# Patient Record
Sex: Female | Born: 1980 | Race: White | Hispanic: No | Marital: Married | Smoking: Never smoker
Health system: Southern US, Community
[De-identification: ages and names within clinical notes are randomized; demographics above are authoritative.]

## PROBLEM LIST (undated history)

## (undated) DIAGNOSIS — F419 Anxiety disorder, unspecified: Secondary | ICD-10-CM

## (undated) DIAGNOSIS — F32A Depression, unspecified: Secondary | ICD-10-CM

## (undated) DIAGNOSIS — G4700 Insomnia, unspecified: Secondary | ICD-10-CM

## (undated) DIAGNOSIS — R000 Tachycardia, unspecified: Secondary | ICD-10-CM

## (undated) DIAGNOSIS — F329 Major depressive disorder, single episode, unspecified: Secondary | ICD-10-CM

## (undated) HISTORY — DX: Depression, unspecified: F32.A

## (undated) HISTORY — DX: Anxiety disorder, unspecified: F41.9

## (undated) HISTORY — PX: APPENDECTOMY: SHX54

## (undated) HISTORY — DX: Tachycardia, unspecified: R00.0

## (undated) HISTORY — DX: Major depressive disorder, single episode, unspecified: F32.9

## (undated) HISTORY — DX: Insomnia, unspecified: G47.00

---

## 2007-08-04 ENCOUNTER — Ambulatory Visit: Payer: Self-pay | Admitting: Family Medicine

## 2007-08-09 ENCOUNTER — Ambulatory Visit: Payer: Self-pay | Admitting: Obstetrics and Gynecology

## 2007-12-30 ENCOUNTER — Emergency Department: Payer: Self-pay | Admitting: Internal Medicine

## 2008-10-06 ENCOUNTER — Ambulatory Visit: Payer: Self-pay | Admitting: Family Medicine

## 2008-10-07 ENCOUNTER — Ambulatory Visit: Payer: Self-pay | Admitting: General Surgery

## 2012-09-05 ENCOUNTER — Encounter: Payer: Self-pay | Admitting: Family Medicine

## 2012-09-05 ENCOUNTER — Encounter: Payer: Self-pay | Admitting: *Deleted

## 2012-09-05 ENCOUNTER — Ambulatory Visit (INDEPENDENT_AMBULATORY_CARE_PROVIDER_SITE_OTHER): Payer: BC Managed Care – PPO | Admitting: Family Medicine

## 2012-09-05 VITALS — BP 110/80 | HR 60 | Temp 97.8°F | Ht 66.0 in | Wt 173.0 lb

## 2012-09-05 DIAGNOSIS — F32A Depression, unspecified: Secondary | ICD-10-CM

## 2012-09-05 DIAGNOSIS — F3341 Major depressive disorder, recurrent, in partial remission: Secondary | ICD-10-CM | POA: Insufficient documentation

## 2012-09-05 DIAGNOSIS — G4700 Insomnia, unspecified: Secondary | ICD-10-CM | POA: Insufficient documentation

## 2012-09-05 DIAGNOSIS — F329 Major depressive disorder, single episode, unspecified: Secondary | ICD-10-CM

## 2012-09-05 DIAGNOSIS — R5383 Other fatigue: Secondary | ICD-10-CM | POA: Insufficient documentation

## 2012-09-05 DIAGNOSIS — Z8349 Family history of other endocrine, nutritional and metabolic diseases: Secondary | ICD-10-CM

## 2012-09-05 DIAGNOSIS — Z8342 Family history of familial hypercholesterolemia: Secondary | ICD-10-CM

## 2012-09-05 DIAGNOSIS — R5381 Other malaise: Secondary | ICD-10-CM | POA: Insufficient documentation

## 2012-09-05 DIAGNOSIS — F3289 Other specified depressive episodes: Secondary | ICD-10-CM

## 2012-09-05 LAB — CBC WITH DIFFERENTIAL/PLATELET
Basophils Absolute: 0.1 10*3/uL (ref 0.0–0.1)
Eosinophils Relative: 1.4 % (ref 0.0–5.0)
Lymphs Abs: 2.3 10*3/uL (ref 0.7–4.0)
MCV: 86 fl (ref 78.0–100.0)
Monocytes Absolute: 0.5 10*3/uL (ref 0.1–1.0)
Neutrophils Relative %: 61.7 % (ref 43.0–77.0)
Platelets: 250 10*3/uL (ref 150.0–400.0)
RDW: 12.6 % (ref 11.5–14.6)
WBC: 7.8 10*3/uL (ref 4.5–10.5)

## 2012-09-05 LAB — LIPID PANEL
Cholesterol: 227 mg/dL — ABNORMAL HIGH (ref 0–200)
HDL: 68.1 mg/dL (ref >39.00)
Total CHOL/HDL Ratio: 3
Triglycerides: 89 mg/dL (ref 0.0–149.0)

## 2012-09-05 LAB — COMPREHENSIVE METABOLIC PANEL
AST: 13 U/L (ref 0–37)
Alkaline Phosphatase: 67 U/L (ref 39–117)
BUN: 9 mg/dL (ref 6–23)
Calcium: 8.9 mg/dL (ref 8.4–10.5)
Creatinine, Ser: 1 mg/dL (ref 0.4–1.2)
Glucose, Bld: 68 mg/dL — ABNORMAL LOW (ref 70–99)

## 2012-09-05 LAB — TSH: TSH: 1 u[IU]/mL (ref 0.35–5.50)

## 2012-09-05 LAB — LDL CHOLESTEROL, DIRECT: Direct LDL: 141.4 mg/dL

## 2012-09-05 NOTE — Progress Notes (Signed)
  Subjective:    Patient ID: Angela Knox, female    DOB: [birth year], 32 y.o.   MRN: [id number]  HPI  Very pleasant 32 yo G0 here to establish care.  In April 28, 2023, her brother died at 15 yo.  He had HTN and CKD.  Was told he died of "heart dissection." Since then, she decided she needed to come get some screening blood work. Does have pos FH of HLD.  Sees OBGYN, Madelin Headings, regularly. Last pap smear was in August 2013.  Depression- on wellbutrin.  Feels current does is working well and takes occasional Ambien.  Denies any current symptoms of anxiety or depression.  Patient Active Problem List   Diagnosis Date Noted  . Other malaise and fatigue 09/05/2012  . Family history of high cholesterol 09/05/2012  . Depression   . Insomnia    Past Medical History  Diagnosis Date  . Depression   . Insomnia    Past Surgical History  Procedure Laterality Date  . Appendectomy     History  Substance Use Topics  . Smoking status: Never Smoker   . Smokeless tobacco: Not on file  . Alcohol Use: Not on file   Family History  Problem Relation Age of Onset  . Hyperlipidemia Father   . Heart disease Brother   . Kidney disease Brother   . Hypertension Brother    Allergies  Allergen Reactions  . Asa (Aspirin) Itching and Rash   No current outpatient prescriptions on file prior to visit.   No current facility-administered medications on file prior to visit.   The PMH, PSH, Social History, Family History, Medications, and allergies have been reviewed in [facility], and have been updated if relevant.    Review of Systems    See HPI + fatigue No CP or DOE  Objective:   Physical Exam BP 110/80  Pulse 60  Temp(Src) 97.8 F (36.6 C)  Ht 5\' 6"  (1.676 m)  Wt 173 lb (78.472 kg)  BMI 27.94 kg/m2  General:  Well-developed,well-nourished,in no acute distress; alert,appropriate and cooperative throughout examination Head:  normocephalic and atraumatic.   Eyes:  vision grossly intact,  pupils equal, pupils round, and pupils reactive to light.   Ears:  R ear normal and L ear normal.   Nose:  no external deformity.   Mouth:  good dentition.   Lungs:  Normal respiratory effort, chest expands symmetrically. Lungs are clear to auscultation, no crackles or wheezes. Heart:  Normal rate and regular rhythm. S1 and S2 normal without gallop, murmur, click, rub or other extra sounds. Abdomen:  Bowel sounds positive,abdomen soft and non-tender without masses, organomegaly or hernias noted. Msk:  No deformity or scoliosis noted of thoracic or lumbar spine.   Extremities:  No clubbing, cyanosis, edema, or deformity noted with normal full range of motion of all joints.   Neurologic:  alert & oriented X3 and gait normal.   Skin:  Intact without suspicious lesions or rashes Psych:  Cognition and judgment appear intact. Alert and cooperative with normal attention span and concentration. No apparent delusions, illusions, hallucinations     Assessment & Plan:  1. Depression Stable on current rx.  2. Other malaise and fatigue Intermittent.  Likely multifactorial.  Will check labs today. - CBC with Differential - TSH  3. Family history of high cholesterol  - Comprehensive metabolic panel - Lipid Panel

## 2012-09-05 NOTE — Patient Instructions (Addendum)
Nice to meet you. We will call you with your lab results.   

## 2012-09-11 ENCOUNTER — Encounter: Payer: Self-pay | Admitting: Family Medicine

## 2012-09-11 NOTE — Telephone Encounter (Signed)
Opened in error

## 2012-09-11 NOTE — Telephone Encounter (Signed)
Advised patient of lab results, diet information mailed to her.

## 2013-02-27 ENCOUNTER — Other Ambulatory Visit: Payer: Self-pay

## 2014-01-14 LAB — HM PAP SMEAR: HM Pap smear: NEGATIVE

## 2014-09-23 ENCOUNTER — Ambulatory Visit (INDEPENDENT_AMBULATORY_CARE_PROVIDER_SITE_OTHER): Payer: BLUE CROSS/BLUE SHIELD | Admitting: Obstetrics and Gynecology

## 2014-09-23 ENCOUNTER — Encounter: Payer: Self-pay | Admitting: Obstetrics and Gynecology

## 2014-09-23 VITALS — BP 126/82 | HR 93 | Ht 65.0 in | Wt 187.6 lb

## 2014-09-23 DIAGNOSIS — R5383 Other fatigue: Secondary | ICD-10-CM | POA: Diagnosis not present

## 2014-09-23 DIAGNOSIS — R635 Abnormal weight gain: Secondary | ICD-10-CM | POA: Diagnosis not present

## 2014-09-23 MED ORDER — CYANOCOBALAMIN 1000 MCG/ML IJ SOLN
1000.0000 ug | Freq: Once | INTRAMUSCULAR | Status: AC
  Administered 2014-09-23: 1000 ug via INTRAMUSCULAR

## 2014-09-23 MED ORDER — CYANOCOBALAMIN 1000 MCG/ML IJ SOLN: 1000.0000 ug | Freq: Once | INTRAMUSCULAR | Status: DC

## 2014-09-23 MED ORDER — PHENTERMINE HCL 37.5 MG PO TABS: 37.5000 mg | ORAL_TABLET | Freq: Every day | ORAL | Status: DC

## 2014-09-23 NOTE — Progress Notes (Signed)
Subjective:     Angela PallJennifer Knox is a 34 y.o. female here for discussion regarding weight loss. She has noted a weight gain of approximately 8 pounds over the last 5 months. She feels ideal weight is 160 pounds. Weight at graduation from high school was ? pounds. History of eating disorders: none. There is a family history positive for obesity in the patient, mother and father. Previous treatments for obesity include commercial weight loss program: adipex, prescription appetite suppressants: adipex, self-directed dieting and Weight Watchers. Obesity associated medical conditions: depression. Obesity associated medications: wellbutrin. Cardiovascular risk factors besides obesity: none.  The following portions of the patient's history were reviewed and updated as appropriate: allergies, current medications, past family history, past medical history, past social history, past surgical history and problem list.  Review of Systems A comprehensive review of systems was negative except for: Gastrointestinal: positive for change in bowel habits Behavioral/Psych: positive for depression Allergic/Immunologic: positive for hay fever    Objective:    Body mass index is 31.22 kg/(m^2). BP 126/82 mmHg  Pulse 93  Ht 5\' 5"  (1.651 m)  Wt 187 lb 9.6 oz (85.095 kg)  BMI 31.22 kg/m2  LMP 09/16/2014 (Exact Date)  General Appearance:    Alert, cooperative, no distress, appears stated age  Head:    Normocephalic, without obvious abnormality, atraumatic  Eyes:    PERRL, conjunctiva/corneas clear, EOM's intact, fundi    benign, both eyes  Ears:    Normal TM's and external ear canals, both ears  Nose:   Nares normal, septum midline, mucosa normal, no drainage    or sinus tenderness  Throat:   Lips, mucosa, and tongue normal; teeth and gums normal  Neck:   Supple, symmetrical, trachea midline, no adenopathy;    thyroid:  no enlargement/tenderness/nodules; no carotid   bruit or JVD  Back:     Symmetric, no  curvature, ROM normal, no CVA tenderness  Lungs:     Clear to auscultation bilaterally, respirations unlabored  Chest Wall:    No tenderness or deformity   Heart:    Regular rate and rhythm, S1 and S2 normal, no murmur, rub   or gallop  Breast Exam:  Not indicated  Abdomen:     Soft, non-tender, bowel sounds active all four quadrants,    no masses, no organomegaly  Genitalia:  Not indicated  Rectal:  Not indicated  Extremities:   Extremities normal, atraumatic, no cyanosis or edema  Pulses:   2+ and symmetric all extremities  Skin:   Skin color, texture, turgor normal, no rashes or lesions  Lymph nodes:   Cervical, supraclavicular, and axillary nodes normal  Neurologic:   CNII-XII intact, normal strength, sensation and reflexes    throughout      Assessment:    Obesity. I assessed Angela Knox to be in a relapse stage with respect to weight loss.    Plan:     Labs obtained today: Vitamin B & D, metabolic panel, CBC, iron, Thyriod panel  Daimen Shovlin Elissa LovettN Burr, CNM [facility]

## 2014-09-24 ENCOUNTER — Encounter: Payer: Self-pay | Admitting: Obstetrics and Gynecology

## 2014-09-24 ENCOUNTER — Other Ambulatory Visit: Payer: Self-pay | Admitting: Obstetrics and Gynecology

## 2014-09-24 LAB — COMPREHENSIVE METABOLIC PANEL
ALK PHOS: 90 IU/L (ref 39–117)
ALT: 13 IU/L (ref 0–32)
AST: 12 IU/L (ref 0–40)
Albumin/Globulin Ratio: 1.7 (ref 1.1–2.5)
Albumin: 4.2 g/dL (ref 3.5–5.5)
BUN / CREAT RATIO: 9 (ref 8–20)
BUN: 8 mg/dL (ref 6–20)
CO2: 25 mmol/L (ref 18–29)
Calcium: 9.2 mg/dL (ref 8.7–10.2)
Chloride: 99 mmol/L (ref 97–108)
Creatinine, Ser: 0.87 mg/dL (ref 0.57–1.00)
GFR calc Af Amer: 103 mL/min/{1.73_m2} (ref >59)
GFR calc non Af Amer: 89 mL/min/{1.73_m2} (ref >59)
GLOBULIN, TOTAL: 2.5 g/dL (ref 1.5–4.5)
Glucose: 111 mg/dL — ABNORMAL HIGH (ref 65–99)
Potassium: 3.8 mmol/L (ref 3.5–5.2)
SODIUM: 140 mmol/L (ref 134–144)
Total Protein: 6.7 g/dL (ref 6.0–8.5)

## 2014-09-24 LAB — VITAMIN D 25 HYDROXY (VIT D DEFICIENCY, FRACTURES): Vit D, 25-Hydroxy: 25.7 ng/mL — ABNORMAL LOW (ref 30.0–100.0)

## 2014-09-24 LAB — CBC WITH DIFFERENTIAL/PLATELET
Basophils Absolute: 0 10*3/uL (ref 0.0–0.2)
Basos: 0 %
EOS (ABSOLUTE): 0.1 10*3/uL (ref 0.0–0.4)
EOS: 1 %
HEMATOCRIT: 38 % (ref 34.0–46.6)
HEMOGLOBIN: 12.5 g/dL (ref 11.1–15.9)
Immature Grans (Abs): 0 10*3/uL (ref 0.0–0.1)
Immature Granulocytes: 0 %
Lymphocytes Absolute: 2.3 10*3/uL (ref 0.7–3.1)
Lymphs: 28 %
MCH: 28 pg (ref 26.6–33.0)
MCHC: 32.9 g/dL (ref 31.5–35.7)
MCV: 85 fL (ref 79–97)
Monocytes Absolute: 0.6 10*3/uL (ref 0.1–0.9)
Monocytes: 7 %
NEUTROS PCT: 64 %
Neutrophils Absolute: 5.1 10*3/uL (ref 1.4–7.0)
PLATELETS: 311 10*3/uL (ref 150–379)
RBC: 4.47 x10E6/uL (ref 3.77–5.28)
RDW: 13.2 % (ref 12.3–15.4)
WBC: 8 10*3/uL (ref 3.4–10.8)

## 2014-09-24 LAB — VITAMIN B12: Vitamin B-12: 268 pg/mL (ref 211–946)

## 2014-09-24 LAB — FERRITIN: Ferritin: 47 ng/mL (ref 15–150)

## 2014-09-24 MED ORDER — VITAMIN D (ERGOCALCIFEROL) 1.25 MG (50000 UNIT) PO CAPS: 50000.0000 [IU] | ORAL_CAPSULE | ORAL | Status: DC

## 2014-09-25 ENCOUNTER — Telehealth: Payer: Self-pay | Admitting: *Deleted

## 2014-09-25 LAB — THYROID PANEL WITH TSH
FREE THYROXINE INDEX: 2.4 (ref 1.2–4.9)
T3 UPTAKE RATIO: 24 % (ref 24–39)
T4, Total: 10.2 ug/dL (ref 4.5–12.0)
TSH: 1.68 u[IU]/mL (ref 0.450–4.500)

## 2014-09-25 LAB — SPECIMEN STATUS REPORT

## 2014-09-25 MED ORDER — CYANOCOBALAMIN 1000 MCG/ML IJ SOLN: 1000.0000 ug | INTRAMUSCULAR | Status: DC

## 2014-09-25 NOTE — Telephone Encounter (Signed)
Faxed request from pharmacy to clarify directions New rx sent with correct instructions

## 2014-09-29 NOTE — Progress Notes (Signed)
Quick Note:  Please let her know her entire thyriod panel was normal ______

## 2014-10-21 ENCOUNTER — Ambulatory Visit (INDEPENDENT_AMBULATORY_CARE_PROVIDER_SITE_OTHER): Payer: BLUE CROSS/BLUE SHIELD | Admitting: *Deleted

## 2014-10-21 VITALS — BP 119/83 | HR 87 | Ht 65.0 in | Wt 180.6 lb

## 2014-10-21 DIAGNOSIS — E669 Obesity, unspecified: Secondary | ICD-10-CM | POA: Diagnosis not present

## 2014-10-21 MED ORDER — CYANOCOBALAMIN 1000 MCG/ML IJ SOLN
1000.0000 ug | Freq: Once | INTRAMUSCULAR | Status: AC
  Administered 2014-10-21: 1000 ug via INTRAMUSCULAR

## 2014-10-21 NOTE — Progress Notes (Cosign Needed)
Pt is here for wt, bp check, b-12 inj Pt denies any s/e, is happy with her weight loss

## 2014-11-25 ENCOUNTER — Ambulatory Visit (INDEPENDENT_AMBULATORY_CARE_PROVIDER_SITE_OTHER): Payer: BLUE CROSS/BLUE SHIELD | Admitting: Obstetrics and Gynecology

## 2014-11-25 VITALS — BP 113/88 | HR 101 | Ht 65.0 in | Wt 175.8 lb

## 2014-11-25 DIAGNOSIS — E669 Obesity, unspecified: Secondary | ICD-10-CM

## 2014-11-25 MED ORDER — CYANOCOBALAMIN 1000 MCG/ML IJ SOLN
1000.0000 ug | Freq: Once | INTRAMUSCULAR | Status: AC
  Administered 2014-11-25: 1000 ug via INTRAMUSCULAR

## 2014-11-25 NOTE — Progress Notes (Cosign Needed)
Pt is here for wt, bp check, b-12 inj She is doing well on medication, denies any s/e

## 2014-12-25 ENCOUNTER — Ambulatory Visit (INDEPENDENT_AMBULATORY_CARE_PROVIDER_SITE_OTHER): Payer: BLUE CROSS/BLUE SHIELD | Admitting: Obstetrics and Gynecology

## 2014-12-25 VITALS — BP 116/85 | HR 105 | Ht 65.0 in | Wt 174.2 lb

## 2014-12-25 DIAGNOSIS — E669 Obesity, unspecified: Secondary | ICD-10-CM

## 2014-12-25 NOTE — Progress Notes (Signed)
Patient ID: Angela Knox, female   DOB: [birth year], 34 y.o.   MRN: [id number] Pt presents for wt., B/P, and B-12 injection.  Wt. Loss of nearly 1lb since last month. No s/s side effects of medication. Pt states it has been too hot to exercise outside so exercising has decreased. Pt desires to get B-12 injection at annual visit because it made her sick the last time she got it before she eat.

## 2015-01-19 ENCOUNTER — Encounter: Payer: Self-pay | Admitting: Obstetrics and Gynecology

## 2015-01-19 ENCOUNTER — Other Ambulatory Visit: Payer: Self-pay | Admitting: Obstetrics and Gynecology

## 2015-01-19 ENCOUNTER — Ambulatory Visit (INDEPENDENT_AMBULATORY_CARE_PROVIDER_SITE_OTHER): Payer: BLUE CROSS/BLUE SHIELD | Admitting: Obstetrics and Gynecology

## 2015-01-19 VITALS — BP 120/80 | HR 99 | Ht 65.0 in | Wt 174.5 lb

## 2015-01-19 DIAGNOSIS — Z01419 Encounter for gynecological examination (general) (routine) without abnormal findings: Secondary | ICD-10-CM | POA: Diagnosis not present

## 2015-01-19 MED ORDER — ZOLPIDEM TARTRATE 10 MG PO TABS: 10.0000 mg | ORAL_TABLET | Freq: Every evening | ORAL | Status: DC | PRN

## 2015-01-19 NOTE — Progress Notes (Signed)
Subjective:   Angela Knox is a 34 y.o. No obstetric history on file. Caucasian female here for a routine well-woman exam.  Patient's last menstrual period was 01/06/2015 (exact date).    Current complaints: none PCP: me       Does need labs  Social History: Sexual: heterosexual Marital Status: married Living situation: with spouse Occupation: self-employed Tobacco/alcohol: no tobacco use Illicit drugs: no history of illicit drug use  The following portions of the patient's history were reviewed and updated as appropriate: allergies, current medications, past family history, past medical history, past social history, past surgical history and problem list.  Past Medical History Past Medical History  Diagnosis Date  . Depression   . Insomnia     Past Surgical History Past Surgical History  Procedure Laterality Date  . Appendectomy      Gynecologic History No obstetric history on file.  Patient's last menstrual period was 01/06/2015 (exact date). Contraception: OCP (estrogen/progesterone) Last Pap: 2014. Results were: normal  Obstetric History OB History  No data available    Current Medications Current Outpatient Prescriptions on File Prior to Visit  Medication Sig Dispense Refill  . buPROPion (WELLBUTRIN SR) 150 MG 12 hr tablet Take 150 mg by mouth daily.    . cyanocobalamin (,VITAMIN B-12,) 1000 MCG/ML injection Inject 1 mL (1,000 mcg total) into the muscle every 30 (thirty) days. 10 mL 1  . norethindrone-ethinyl estradiol-iron (MICROGESTIN FE,GILDESS FE,LOESTRIN FE) 1.5-30 MG-MCG tablet Take 1 tablet by mouth daily.    . Vitamin D, Ergocalciferol, (DRISDOL) 50000 UNITS CAPS capsule Take 1 capsule (50,000 Units total) by mouth every 7 (seven) days. 30 capsule 1  . phentermine (ADIPEX-P) 37.5 MG tablet Take 1 tablet (37.5 mg total) by mouth daily before breakfast. (Patient not taking: Reported on 01/19/2015) 30 tablet 2   No current facility-administered  medications on file prior to visit.    Review of Systems Patient denies any headaches, blurred vision, shortness of breath, chest pain, abdominal pain, problems with bowel movements, urination, or intercourse.  Objective:  BP 120/80 mmHg  Pulse 99  Ht  (1.651 m)  Wt 174 lb 8 oz (79.153 kg)  BMI 29.04 kg/m2  LMP 01/06/2015 (Exact Date) Physical Exam  General:  Well developed, well nourished, no acute distress. She is alert and oriented x3. Skin:  Warm and dry Neck:  Midline trachea, no thyromegaly or nodules Cardiovascular: Regular rate and rhythm, no murmur heard Lungs:  Effort normal, all lung fields clear to auscultation bilaterally Breasts:  No dominant palpable mass, retraction, or nipple discharge Abdomen:  Soft, non tender, no hepatosplenomegaly or masses Pelvic:  External genitalia is normal in appearance.  The vagina is normal in appearance. The cervix is bulbous, no CMT.  Thin prep pap is done with HR HPV cotesting. Uterus is felt to be normal size, shape, and contour.  No adnexal masses or tenderness noted. Extremities:  No swelling or varicosities noted Psych:  She has a normal mood and affect  Assessment:   Healthy well-woman exam; overweight; vitamin D deficiency; sleep disturbance  Plan:  Pap and screening labs obtained F/U 1 year for AE, or sooner if needed  Melody Elissa Lovett, CNM

## 2015-01-20 LAB — HEMOGLOBIN A1C
ESTIMATED AVERAGE GLUCOSE: 114 mg/dL
HEMOGLOBIN A1C: 5.6 % (ref 4.8–5.6)

## 2015-01-20 LAB — LIPID PANEL
Chol/HDL Ratio: 3.8 ratio units (ref 0.0–4.4)
Cholesterol, Total: 218 mg/dL — ABNORMAL HIGH (ref 100–199)
HDL: 58 mg/dL (ref >39)
LDL CALC: 132 mg/dL — AB (ref 0–99)
TRIGLYCERIDES: 139 mg/dL (ref 0–149)
VLDL Cholesterol Cal: 28 mg/dL (ref 5–40)

## 2015-01-20 LAB — COMPREHENSIVE METABOLIC PANEL
A/G RATIO: 1.8 (ref 1.1–2.5)
ALT: 14 IU/L (ref 0–32)
AST: 17 IU/L (ref 0–40)
Albumin: 4.4 g/dL (ref 3.5–5.5)
Alkaline Phosphatase: 90 IU/L (ref 39–117)
BUN/Creatinine Ratio: 7 — ABNORMAL LOW (ref 8–20)
BUN: 7 mg/dL (ref 6–20)
Bilirubin Total: 0.2 mg/dL (ref 0.0–1.2)
CO2: 25 mmol/L (ref 18–29)
Calcium: 9.2 mg/dL (ref 8.7–10.2)
Chloride: 98 mmol/L (ref 97–108)
Creatinine, Ser: 0.95 mg/dL (ref 0.57–1.00)
GFR calc Af Amer: 92 mL/min/{1.73_m2} (ref >59)
GFR calc non Af Amer: 80 mL/min/{1.73_m2} (ref >59)
Globulin, Total: 2.5 g/dL (ref 1.5–4.5)
Glucose: 87 mg/dL (ref 65–99)
POTASSIUM: 4.1 mmol/L (ref 3.5–5.2)
Sodium: 140 mmol/L (ref 134–144)
Total Protein: 6.9 g/dL (ref 6.0–8.5)

## 2015-01-20 LAB — VITAMIN D 25 HYDROXY (VIT D DEFICIENCY, FRACTURES): VIT D 25 HYDROXY: 30.4 ng/mL (ref 30.0–100.0)

## 2015-01-20 LAB — CYTOLOGY - PAP

## 2015-01-26 ENCOUNTER — Telehealth: Payer: Self-pay | Admitting: *Deleted

## 2015-01-26 NOTE — Telephone Encounter (Signed)
Notified pt of all her lab results, she voiced understanding

## 2015-01-26 NOTE — Telephone Encounter (Signed)
Notified pt of - pap results  

## 2015-01-26 NOTE — Telephone Encounter (Signed)
-----   Message from Ulyses Amor, [place] sent at 01/26/2015  1:41 PM EDT ----- Please let her know labs were normal except chol is still a little high- to continue low chol diet and regular exercise, will recheck at next years annual exam, vit D is just now normal at 30- to continue Vit D supplements to keep it from dropping during the winter months.

## 2015-06-02 ENCOUNTER — Encounter: Payer: Self-pay | Admitting: Obstetrics and Gynecology

## 2015-06-02 ENCOUNTER — Ambulatory Visit (INDEPENDENT_AMBULATORY_CARE_PROVIDER_SITE_OTHER): Payer: BLUE CROSS/BLUE SHIELD | Admitting: Obstetrics and Gynecology

## 2015-06-02 VITALS — BP 120/78 | HR 82 | Ht 66.0 in | Wt 181.4 lb

## 2015-06-02 DIAGNOSIS — E663 Overweight: Secondary | ICD-10-CM | POA: Diagnosis not present

## 2015-06-02 DIAGNOSIS — F329 Major depressive disorder, single episode, unspecified: Secondary | ICD-10-CM

## 2015-06-02 DIAGNOSIS — G479 Sleep disorder, unspecified: Secondary | ICD-10-CM

## 2015-06-02 DIAGNOSIS — F32A Depression, unspecified: Secondary | ICD-10-CM

## 2015-06-02 MED ORDER — FLUOXETINE HCL 10 MG PO CAPS: 10.0000 mg | ORAL_CAPSULE | Freq: Two times a day (BID) | ORAL | Status: DC

## 2015-06-02 MED ORDER — ZOLPIDEM TARTRATE 10 MG PO TABS: 10.0000 mg | ORAL_TABLET | Freq: Every evening | ORAL | Status: DC | PRN

## 2015-06-02 NOTE — Progress Notes (Signed)
Subjective:     Patient ID: Angela Knox, female   DOB: Jan 03, 1981, 35 y.o.   MRN: [id number]  HPI Depression not improving on wellbutrin, and possibly worse, reports no interest in doing anything but sleeping x 2 months.  Has regained some of weight. Desires changing meds.  Review of Systems See above    Objective:   Physical Exam A&O x4 Well groomed female in no distress Blood pressure 120/78, pulse 82, height  (1.676 m), weight 181 lb 6.4 oz (82.283 kg), last menstrual period 05/26/2015. Depression screen PHQ 2/9 06/02/2015  Decreased Interest 3  Down, Depressed, Hopeless 2  PHQ - 2 Score 5  Altered sleeping 3  Tired, decreased energy 3  Change in appetite 2  Feeling bad or failure about yourself  1  Trouble concentrating 3  Moving slowly or fidgety/restless 1  Suicidal thoughts 0  PHQ-9 Score 18  Difficult doing work/chores Somewhat difficult       Assessment:     Depression, not under control with SNRI Weight gain Sleep disturbance     Plan:     Stop wellbutrin, switch to prozac  qd x14d, then  qd. Refilled Ambien Recheck meds in 4-5 weeks and will consider restarting weight loss meds then.  Melody Branch, CNM

## 2015-06-04 ENCOUNTER — Encounter: Payer: Self-pay | Admitting: Obstetrics and Gynecology

## 2015-06-09 ENCOUNTER — Encounter: Payer: Self-pay | Admitting: Obstetrics and Gynecology

## 2015-07-01 ENCOUNTER — Encounter: Payer: Self-pay | Admitting: Obstetrics and Gynecology

## 2015-07-01 ENCOUNTER — Ambulatory Visit (INDEPENDENT_AMBULATORY_CARE_PROVIDER_SITE_OTHER): Payer: BLUE CROSS/BLUE SHIELD | Admitting: Obstetrics and Gynecology

## 2015-07-01 ENCOUNTER — Ambulatory Visit: Payer: BLUE CROSS/BLUE SHIELD | Admitting: Obstetrics and Gynecology

## 2015-07-01 VITALS — BP 105/75 | HR 83 | Ht 65.0 in | Wt 185.2 lb

## 2015-07-01 DIAGNOSIS — E663 Overweight: Secondary | ICD-10-CM | POA: Diagnosis not present

## 2015-07-01 DIAGNOSIS — F32A Depression, unspecified: Secondary | ICD-10-CM

## 2015-07-01 DIAGNOSIS — F329 Major depressive disorder, single episode, unspecified: Secondary | ICD-10-CM

## 2015-07-01 MED ORDER — PHENTERMINE HCL 37.5 MG PO TABS: 37.5000 mg | ORAL_TABLET | Freq: Every day | ORAL | Status: DC

## 2015-07-01 MED ORDER — CYANOCOBALAMIN 1000 MCG/ML IJ SOLN: 1000.0000 ug | INTRAMUSCULAR | Status: DC

## 2015-07-01 MED ORDER — FLUOXETINE HCL 10 MG PO CAPS: 10.0000 mg | ORAL_CAPSULE | Freq: Two times a day (BID) | ORAL | Status: DC

## 2015-07-01 NOTE — Progress Notes (Signed)
Subjective:     Patient ID: Lennart PallJennifer Ryans, female   DOB: 1980/06/05, 35 y.o.   MRN: [id number]  HPI Changed depression medication 4 weeks ago  Review of Systems Feeling much better, sleeping well, does desire restart of weight loss medications.    Objective:   Physical Exam A&O x4 Well groomed female in no distress, pleasant affect, smiling Blood pressure 105/75, pulse 83, height 5\' 5"  (1.651 m), weight 185 lb 3.2 oz (84.006 kg), last menstrual period 06/23/2015. [facility] 2/[facility] 07/01/2015 06/02/2015  Decreased Interest 1 3  Down, Depressed, Hopeless 0 2  PHQ - 2 Score 1 5  Altered sleeping 0 3  Tired, decreased energy 1 3  Change in appetite 0 2  Feeling bad or failure about yourself  0 1  Trouble concentrating 0 3  Moving slowly or fidgety/restless 0 1  Suicidal thoughts 0 0  PHQ-9 Score 2 18  Difficult doing work/chores Somewhat difficult Somewhat difficult       Assessment:     Depression under good control with Prozac overweight     Plan:     Continue Prozac at 20mg  Restart adipex and B12 injection- first shot given today. RTC 4 weeks   Melody Amher[place]amble[place], [place]

## 2015-08-03 ENCOUNTER — Ambulatory Visit: Payer: BLUE CROSS/BLUE SHIELD | Admitting: Obstetrics and Gynecology

## 2015-08-12 ENCOUNTER — Ambulatory Visit: Payer: BLUE CROSS/BLUE SHIELD | Admitting: Obstetrics and Gynecology

## 2015-09-21 ENCOUNTER — Ambulatory Visit (INDEPENDENT_AMBULATORY_CARE_PROVIDER_SITE_OTHER): Payer: BLUE CROSS/BLUE SHIELD | Admitting: Obstetrics and Gynecology

## 2015-09-21 ENCOUNTER — Other Ambulatory Visit: Payer: Self-pay | Admitting: Obstetrics and Gynecology

## 2015-09-21 ENCOUNTER — Encounter: Payer: Self-pay | Admitting: Obstetrics and Gynecology

## 2015-09-21 VITALS — BP 114/84 | HR 101 | Ht 65.0 in | Wt 182.4 lb

## 2015-09-21 DIAGNOSIS — E669 Obesity, unspecified: Secondary | ICD-10-CM | POA: Diagnosis not present

## 2015-09-21 DIAGNOSIS — Z79899 Other long term (current) drug therapy: Secondary | ICD-10-CM | POA: Diagnosis not present

## 2015-09-21 NOTE — Progress Notes (Signed)
SUBJECTIVE:  35 y.o. here for follow-up weight loss visit, previously seen 4 weeks ago. Denies any concerns and feels like medication works well.  Desires to continue with it, although she just started it this weekend.Also discussed prozac, and she feels like it is working very well.  OBJECTIVE:  BP 114/84 mmHg  Pulse 101  Ht 5\' 5"  (1.651 m)  Wt 182 lb 6.4 oz (82.736 kg)  BMI 30.35 kg/m2  LMP 09/15/2015  Body mass index is 30.35 kg/(m^2). Patient appears well. ASSESSMENT:  Obesity- responding well to weight loss plan PLAN:  To continue with current medications. B12 108100mcg/ml injection given RTC in 4 weeks as planned  Kayleanna Lorman [place], CNM

## 2015-10-19 ENCOUNTER — Ambulatory Visit (INDEPENDENT_AMBULATORY_CARE_PROVIDER_SITE_OTHER): Payer: BLUE CROSS/BLUE SHIELD | Admitting: Obstetrics and Gynecology

## 2015-10-19 VITALS — BP 119/87 | HR 72 | Wt 180.5 lb

## 2015-10-19 DIAGNOSIS — E669 Obesity, unspecified: Secondary | ICD-10-CM | POA: Diagnosis not present

## 2015-10-19 MED ORDER — CYANOCOBALAMIN 1000 MCG/ML IJ SOLN
1000.0000 ug | Freq: Once | INTRAMUSCULAR | Status: AC
  Administered 2015-10-19: 1000 ug via INTRAMUSCULAR

## 2015-10-19 NOTE — Progress Notes (Signed)
Patient ID: Angela PallJennifer Shupert, female   DOB: [birth year], 35 y.o.   MRN: [id number] Pt presents for weight, B/P, B-12 injection. No side effects of medication-Phentermine, or B-12.  Weight loss of  2 lbs. Encouraged eating healthy and exercise.

## 2015-10-20 ENCOUNTER — Other Ambulatory Visit: Payer: Self-pay | Admitting: Obstetrics and Gynecology

## 2015-10-21 ENCOUNTER — Telehealth: Payer: Self-pay | Admitting: Obstetrics and Gynecology

## 2015-10-21 ENCOUNTER — Encounter: Payer: Self-pay | Admitting: Obstetrics and Gynecology

## 2015-10-21 NOTE — Telephone Encounter (Signed)
See message.

## 2015-10-21 NOTE — Telephone Encounter (Signed)
Pt needs refill

## 2015-10-21 NOTE — Telephone Encounter (Signed)
[facility] is having issues with sending refill request (Generic for Ambien) needs to be refilled

## 2015-11-16 ENCOUNTER — Ambulatory Visit (INDEPENDENT_AMBULATORY_CARE_PROVIDER_SITE_OTHER): Payer: BLUE CROSS/BLUE SHIELD | Admitting: Obstetrics and Gynecology

## 2015-11-16 VITALS — BP 108/79 | HR 130 | Wt 180.1 lb

## 2015-11-16 DIAGNOSIS — E669 Obesity, unspecified: Secondary | ICD-10-CM | POA: Diagnosis not present

## 2015-11-16 MED ORDER — CYANOCOBALAMIN 1000 MCG/ML IJ SOLN
1000.0000 ug | Freq: Once | INTRAMUSCULAR | Status: AC
  Administered 2015-11-16: 1000 ug via INTRAMUSCULAR

## 2015-11-16 MED ORDER — ZOLPIDEM TARTRATE 10 MG PO TABS: 10.0000 mg | ORAL_TABLET | Freq: Every evening | ORAL | 2 refills | Status: DC | PRN

## 2015-11-16 NOTE — Progress Notes (Signed)
Patient ID: Angela Knox, female   DOB: [birth year], 35 y.o.   MRN: [id number] Pt presents for weight, B/P, B-12 injection. No side effects of medication-Phentermine, or B-12.  Weight stable at 180 lbs. Encouraged eating healthy and exercise. Refilled Ambien per [facility].

## 2015-12-22 ENCOUNTER — Ambulatory Visit (INDEPENDENT_AMBULATORY_CARE_PROVIDER_SITE_OTHER): Payer: BLUE CROSS/BLUE SHIELD | Admitting: Obstetrics and Gynecology

## 2015-12-22 VITALS — BP 122/86 | HR 88 | Ht 65.0 in | Wt 184.7 lb

## 2015-12-22 DIAGNOSIS — E669 Obesity, unspecified: Secondary | ICD-10-CM | POA: Diagnosis not present

## 2015-12-22 MED ORDER — CYANOCOBALAMIN 1000 MCG/ML IJ SOLN: 1000.0000 ug | Freq: Once | INTRAMUSCULAR | Status: DC

## 2015-12-22 NOTE — Progress Notes (Signed)
Pt is here for weight management, desires refill, states she is doing much better mentally, and is now ready to work on weight loss, states the prozac is working well

## 2016-01-20 ENCOUNTER — Ambulatory Visit: Payer: BLUE CROSS/BLUE SHIELD

## 2016-01-21 ENCOUNTER — Encounter: Payer: BLUE CROSS/BLUE SHIELD | Admitting: Obstetrics and Gynecology

## 2016-01-28 ENCOUNTER — Ambulatory Visit (INDEPENDENT_AMBULATORY_CARE_PROVIDER_SITE_OTHER): Payer: BLUE CROSS/BLUE SHIELD | Admitting: Obstetrics and Gynecology

## 2016-01-28 ENCOUNTER — Encounter: Payer: Self-pay | Admitting: Obstetrics and Gynecology

## 2016-01-28 VITALS — BP 124/80 | HR 93 | Ht 65.0 in | Wt 183.8 lb

## 2016-01-28 DIAGNOSIS — Z01419 Encounter for gynecological examination (general) (routine) without abnormal findings: Secondary | ICD-10-CM

## 2016-01-28 MED ORDER — ZOLPIDEM TARTRATE 10 MG PO TABS: 10.0000 mg | ORAL_TABLET | Freq: Every evening | ORAL | 2 refills | Status: DC | PRN

## 2016-01-28 MED ORDER — NORETHIN ACE-ETH ESTRAD-FE 1.5-30 MG-MCG PO TABS: 1.0000 | ORAL_TABLET | Freq: Every day | ORAL | 11 refills | Status: DC

## 2016-01-28 NOTE — Patient Instructions (Signed)
Preventive Care for Adults, Female A healthy lifestyle and preventive care can promote health and wellness. Preventive health guidelines for women include the following key practices.  A routine yearly physical is a good way to check with your health care provider about your health and preventive screening. It is a chance to share any concerns and updates on your health and to receive a thorough exam.  Visit your dentist for a routine exam and preventive care every 6 months. Brush your teeth twice a day and floss once a day. Good oral hygiene prevents tooth decay and gum disease.  The frequency of eye exams is based on your age, health, family medical history, use of contact lenses, and other factors. Follow your health care provider's recommendations for frequency of eye exams.  Eat a healthy diet. Foods like vegetables, fruits, whole grains, low-fat dairy products, and lean protein foods contain the nutrients you need without too many calories. Decrease your intake of foods high in solid fats, added sugars, and salt. Eat the right amount of calories for you.Get information about a proper diet from your health care provider, if necessary.  Regular physical exercise is one of the most important things you can do for your health. Most adults should get at least 150 minutes of moderate-intensity exercise (any activity that increases your heart rate and causes you to sweat) each week. In addition, most adults need muscle-strengthening exercises on 2 or more days a week.  Maintain a healthy weight. The body mass index (BMI) is a screening tool to identify possible weight problems. It provides an estimate of body fat based on height and weight. Your health care provider can find your BMI and can help you achieve or maintain a healthy weight.For adults 20 years and older:  A BMI below 18.5 is considered underweight.  A BMI of 18.5 to 24.9 is normal.  A BMI of 25 to 29.9 is considered  overweight.  A BMI of 30 and above is considered obese.  Maintain normal blood lipids and cholesterol levels by exercising and minimizing your intake of saturated fat. Eat a balanced diet with plenty of fruit and vegetables. Blood tests for lipids and cholesterol should begin at age 64 and be repeated every 5 years. If your lipid or cholesterol levels are high, you are over 50, or you are at high risk for heart disease, you may need your cholesterol levels checked more frequently.Ongoing high lipid and cholesterol levels should be treated with medicines if diet and exercise are not working.  If you smoke, find out from your health care provider how to quit. If you do not use tobacco, do not start.  Lung cancer screening is recommended for adults aged 52-80 years who are at high risk for developing lung cancer because of a history of smoking. A yearly low-dose CT scan of the lungs is recommended for people who have at least a 30-pack-year history of smoking and are a current smoker or have quit within the past 15 years. A pack year of smoking is smoking an average of 1 pack of cigarettes a day for 1 year (for example: 1 pack a day for 30 years or 2 packs a day for 15 years). Yearly screening should continue until the smoker has stopped smoking for at least 15 years. Yearly screening should be stopped for people who develop a health problem that would prevent them from having lung cancer treatment.  If you are pregnant, do not drink alcohol. If you are  breastfeeding, be very cautious about drinking alcohol. If you are not pregnant and choose to drink alcohol, do not have more than 1 drink per day. One drink is considered to be 12 ounces (355 mL) of beer, 5 ounces (148 mL) of wine, or 1.5 ounces (44 mL) of liquor.  Avoid use of street drugs. Do not share needles with anyone. Ask for help if you need support or instructions about stopping the use of drugs.  High blood pressure causes heart disease and  increases the risk of stroke. Your blood pressure should be checked at least every 1 to 2 years. Ongoing high blood pressure should be treated with medicines if weight loss and exercise do not work.  If you are 25-78 years old, ask your health care provider if you should take aspirin to prevent strokes.  Diabetes screening is done by taking a blood sample to check your blood glucose level after you have not eaten for a certain period of time (fasting). If you are not overweight and you do not have risk factors for diabetes, you should be screened once every 3 years starting at age 86. If you are overweight or obese and you are 3-87 years of age, you should be screened for diabetes every year as part of your cardiovascular risk assessment.  Breast cancer screening is essential preventive care for women. You should practice "breast self-awareness." This means understanding the normal appearance and feel of your breasts and may include breast self-examination. Any changes detected, no matter how small, should be reported to a health care provider. Women in their 66s and 30s should have a clinical breast exam (CBE) by a health care provider as part of a regular health exam every 1 to 3 years. After age 43, women should have a CBE every year. Starting at age 37, women should consider having a mammogram (breast X-ray test) every year. Women who have a family history of breast cancer should talk to their health care provider about genetic screening. Women at a high risk of breast cancer should talk to their health care providers about having an MRI and a mammogram every year.  Breast cancer gene (BRCA)-related cancer risk assessment is recommended for women who have family members with BRCA-related cancers. BRCA-related cancers include breast, ovarian, tubal, and peritoneal cancers. Having family members with these cancers may be associated with an increased risk for harmful changes (mutations) in the breast  cancer genes BRCA1 and BRCA2. Results of the assessment will determine the need for genetic counseling and BRCA1 and BRCA2 testing.  Your health care provider may recommend that you be screened regularly for cancer of the pelvic organs (ovaries, uterus, and vagina). This screening involves a pelvic examination, including checking for microscopic changes to the surface of your cervix (Pap test). You may be encouraged to have this screening done every 3 years, beginning at age 78.  For women ages 79-65, health care providers may recommend pelvic exams and Pap testing every 3 years, or they may recommend the Pap and pelvic exam, combined with testing for human papilloma virus (HPV), every 5 years. Some types of HPV increase your risk of cervical cancer. Testing for HPV may also be done on women of any age with unclear Pap test results.  Other health care providers may not recommend any screening for nonpregnant women who are considered low risk for pelvic cancer and who do not have symptoms. Ask your health care provider if a screening pelvic exam is right for  you.  If you have had past treatment for cervical cancer or a condition that could lead to cancer, you need Pap tests and screening for cancer for at least 20 years after your treatment. If Pap tests have been discontinued, your risk factors (such as having a new sexual partner) need to be reassessed to determine if screening should resume. Some women have medical problems that increase the chance of getting cervical cancer. In these cases, your health care provider may recommend more frequent screening and Pap tests.  Colorectal cancer can be detected and often prevented. Most routine colorectal cancer screening begins at the age of 50 years and continues through age 75 years. However, your health care provider may recommend screening at an earlier age if you have risk factors for colon cancer. On a yearly basis, your health care provider may provide  home test kits to check for hidden blood in the stool. Use of a small camera at the end of a tube, to directly examine the colon (sigmoidoscopy or colonoscopy), can detect the earliest forms of colorectal cancer. Talk to your health care provider about this at age 50, when routine screening begins. Direct exam of the colon should be repeated every 5-10 years through age 75 years, unless early forms of precancerous polyps or small growths are found.  People who are at an increased risk for hepatitis B should be screened for this virus. You are considered at high risk for hepatitis B if:  You were born in a country where hepatitis B occurs often. Talk with your health care provider about which countries are considered high risk.  Your parents were born in a high-risk country and you have not received a shot to protect against hepatitis B (hepatitis B vaccine).  You have HIV or AIDS.  You use needles to inject street drugs.  You live with, or have sex with, someone who has hepatitis B.  You get hemodialysis treatment.  You take certain medicines for conditions like cancer, organ transplantation, and autoimmune conditions.  Hepatitis C blood testing is recommended for all people born from 1945 through 1965 and any individual with known risks for hepatitis C.  Practice safe sex. Use condoms and avoid high-risk sexual practices to reduce the spread of sexually transmitted infections (STIs). STIs include gonorrhea, chlamydia, syphilis, trichomonas, herpes, HPV, and human immunodeficiency virus (HIV). Herpes, HIV, and HPV are viral illnesses that have no cure. They can result in disability, cancer, and death.  You should be screened for sexually transmitted illnesses (STIs) including gonorrhea and chlamydia if:  You are sexually active and are younger than 24 years.  You are older than 24 years and your health care provider tells you that you are at risk for this type of infection.  Your sexual  activity has changed since you were last screened and you are at an increased risk for chlamydia or gonorrhea. Ask your health care provider if you are at risk.  If you are at risk of being infected with HIV, it is recommended that you take a prescription medicine daily to prevent HIV infection. This is called preexposure prophylaxis (PrEP). You are considered at risk if:  You are sexually active and do not regularly use condoms or know the HIV status of your partner(s).  You take drugs by injection.  You are sexually active with a partner who has HIV.  Talk with your health care provider about whether you are at high risk of being infected with HIV. If   you choose to begin PrEP, you should first be tested for HIV. You should then be tested every 3 months for as long as you are taking PrEP.  Osteoporosis is a disease in which the bones lose minerals and strength with aging. This can result in serious bone fractures or breaks. The risk of osteoporosis can be identified using a bone density scan. Women ages 1 years and over and women at risk for fractures or osteoporosis should discuss screening with their health care providers. Ask your health care provider whether you should take a calcium supplement or vitamin D to reduce the rate of osteoporosis.  Menopause can be associated with physical symptoms and risks. Hormone replacement therapy is available to decrease symptoms and risks. You should talk to your health care provider about whether hormone replacement therapy is right for you.  Use sunscreen. Apply sunscreen liberally and repeatedly throughout the day. You should seek shade when your shadow is shorter than you. Protect yourself by wearing long sleeves, pants, a wide-brimmed hat, and sunglasses year round, whenever you are outdoors.  Once a month, do a whole body skin exam, using a mirror to look at the skin on your back. Tell your health care provider of new moles, moles that have irregular  borders, moles that are larger than a pencil eraser, or moles that have changed in shape or color.  Stay current with required vaccines (immunizations).  Influenza vaccine. All adults should be immunized every year.  Tetanus, diphtheria, and acellular pertussis (Td, Tdap) vaccine. Pregnant women should receive 1 dose of Tdap vaccine during each pregnancy. The dose should be obtained regardless of the length of time since the last dose. Immunization is preferred during the 27th-36th week of gestation. An adult who has not previously received Tdap or who does not know her vaccine status should receive 1 dose of Tdap. This initial dose should be followed by tetanus and diphtheria toxoids (Td) booster doses every 10 years. Adults with an unknown or incomplete history of completing a 3-dose immunization series with Td-containing vaccines should begin or complete a primary immunization series including a Tdap dose. Adults should receive a Td booster every 10 years.  Varicella vaccine. An adult without evidence of immunity to varicella should receive 2 doses or a second dose if she has previously received 1 dose. Pregnant females who do not have evidence of immunity should receive the first dose after pregnancy. This first dose should be obtained before leaving the health care facility. The second dose should be obtained 4-8 weeks after the first dose.  Human papillomavirus (HPV) vaccine. Females aged 13-26 years who have not received the vaccine previously should obtain the 3-dose series. The vaccine is not recommended for use in pregnant females. However, pregnancy testing is not needed before receiving a dose. If a female is found to be pregnant after receiving a dose, no treatment is needed. In that case, the remaining doses should be delayed until after the pregnancy. Immunization is recommended for any person with an immunocompromised condition through the age of 24 years if she did not get any or all doses  earlier. During the 3-dose series, the second dose should be obtained 4-8 weeks after the first dose. The third dose should be obtained 24 weeks after the first dose and 16 weeks after the second dose.  Zoster vaccine. One dose is recommended for adults aged 97 years or older unless certain conditions are present.  Measles, mumps, and rubella (MMR) vaccine. Adults born  before 1957 generally are considered immune to measles and mumps. Adults born in [birth year] or later should have 1 or more doses of MMR vaccine unless there is a contraindication to the vaccine or there is laboratory evidence of immunity to each of the three diseases. A routine second dose of MMR vaccine should be obtained at least 28 days after the first dose for students attending postsecondary schools, health care workers, or international travelers. People who received inactivated measles vaccine or an unknown type of measles vaccine during 1963-1967 should receive 2 doses of MMR vaccine. People who received inactivated mumps vaccine or an unknown type of mumps vaccine before 1979 and are at high risk for mumps infection should consider immunization with 2 doses of MMR vaccine. For females of childbearing age, rubella immunity should be determined. If there is no evidence of immunity, females who are not pregnant should be vaccinated. If there is no evidence of immunity, females who are pregnant should delay immunization until after pregnancy. Unvaccinated health care workers born before 60 who lack laboratory evidence of measles, mumps, or rubella immunity or laboratory confirmation of disease should consider measles and mumps immunization with 2 doses of MMR vaccine or rubella immunization with 1 dose of MMR vaccine.  Pneumococcal 13-valent conjugate (PCV13) vaccine. When indicated, a person who is uncertain of his immunization history and has no record of immunization should receive the PCV13 vaccine. All adults 61 years of age and older  should receive this vaccine. An adult aged 92 years or older who has certain medical conditions and has not been previously immunized should receive 1 dose of PCV13 vaccine. This PCV13 should be followed with a dose of pneumococcal polysaccharide (PPSV23) vaccine. Adults who are at high risk for pneumococcal disease should obtain the PPSV23 vaccine at least 8 weeks after the dose of PCV13 vaccine. Adults older than 33 years of age who have normal immune system function should obtain the PPSV23 vaccine dose at least 1 year after the dose of PCV13 vaccine.  Pneumococcal polysaccharide (PPSV23) vaccine. When PCV13 is also indicated, PCV13 should be obtained first. All adults aged 2 years and older should be immunized. An adult younger than age 30 years who has certain medical conditions should be immunized. Any person who resides in a nursing home or long-term care facility should be immunized. An adult smoker should be immunized. People with an immunocompromised condition and certain other conditions should receive both PCV13 and PPSV23 vaccines. People with human immunodeficiency virus (HIV) infection should be immunized as soon as possible after diagnosis. Immunization during chemotherapy or radiation therapy should be avoided. Routine use of PPSV23 vaccine is not recommended for American Indians, [place] Natives, or people younger than 65 years unless there are medical conditions that require PPSV23 vaccine. When indicated, people who have unknown immunization and have no record of immunization should receive PPSV23 vaccine. One-time revaccination 5 years after the first dose of PPSV23 is recommended for people aged 19-64 years who have chronic kidney failure, nephrotic syndrome, asplenia, or immunocompromised conditions. People who received 1-2 doses of PPSV23 before age 44 years should receive another dose of PPSV23 vaccine at age 83 years or later if at least 5 years have passed since the previous dose. Doses  of PPSV23 are not needed for people immunized with PPSV23 at or after age 20 years.  Meningococcal vaccine. Adults with asplenia or persistent complement component deficiencies should receive 2 doses of quadrivalent meningococcal conjugate (MenACWY-D) vaccine. The doses should be obtained  at least 2 months apart. Microbiologists working with certain meningococcal bacteria, [place] recruits, people at risk during an outbreak, and people who travel to or live in countries with a high rate of meningitis should be immunized. A first-year college student up through age 28 years who is living in a residence hall should receive a dose if she did not receive a dose on or after her 16th birthday. Adults who have certain high-risk conditions should receive one or more doses of vaccine.  Hepatitis A vaccine. Adults who wish to be protected from this disease, have certain high-risk conditions, work with hepatitis A-infected animals, work in hepatitis A research labs, or travel to or work in countries with a high rate of hepatitis A should be immunized. Adults who were previously unvaccinated and who anticipate close contact with an international adoptee during the first 60 days after arrival in the [place] from a country with a high rate of hepatitis A should be immunized.  Hepatitis B vaccine. Adults who wish to be protected from this disease, have certain high-risk conditions, may be exposed to blood or other infectious body fluids, are household contacts or sex partners of hepatitis B positive people, are clients or workers in certain care facilities, or travel to or work in countries with a high rate of hepatitis B should be immunized.  Haemophilus influenzae type b (Hib) vaccine. A previously unvaccinated person with asplenia or sickle cell disease or having a scheduled splenectomy should receive 1 dose of Hib vaccine. Regardless of previous immunization, a recipient of a hematopoietic stem cell transplant  should receive a 3-dose series 6-12 months after her successful transplant. Hib vaccine is not recommended for adults with HIV infection. Preventive Services / Frequency Ages 71 to 87 years  Blood pressure check.** / Every 3-5 years.  Lipid and cholesterol check.** / Every 5 years beginning at age 1.  Clinical breast exam.** / Every 3 years for women in their 3s and 31s.  BRCA-related cancer risk assessment.** / For women who have family members with a BRCA-related cancer (breast, ovarian, tubal, or peritoneal cancers).  Pap test.** / Every 2 years from ages 50 through 86. Every 3 years starting at age 87 through age 7 or 75 with a history of 3 consecutive normal Pap tests.  HPV screening.** / Every 3 years from ages 59 through ages 35 to 6 with a history of 3 consecutive normal Pap tests.  Hepatitis C blood test.** / For any individual with known risks for hepatitis C.  Skin self-exam. / Monthly.  Influenza vaccine. / Every year.  Tetanus, diphtheria, and acellular pertussis (Tdap, Td) vaccine.** / Consult your health care provider. Pregnant women should receive 1 dose of Tdap vaccine during each pregnancy. 1 dose of Td every 10 years.  Varicella vaccine.** / Consult your health care provider. Pregnant females who do not have evidence of immunity should receive the first dose after pregnancy.  HPV vaccine. / 3 doses over 6 months, if 72 and younger. The vaccine is not recommended for use in pregnant females. However, pregnancy testing is not needed before receiving a dose.  Measles, mumps, rubella (MMR) vaccine.** / You need at least 1 dose of MMR if you were born in 1957 or later. You may also need a 2nd dose. For females of childbearing age, rubella immunity should be determined. If there is no evidence of immunity, females who are not pregnant should be vaccinated. If there is no evidence of immunity, females who are  pregnant should delay immunization until after  pregnancy.  Pneumococcal 13-valent conjugate (PCV13) vaccine.** / Consult your health care provider.  Pneumococcal polysaccharide (PPSV23) vaccine.** / 1 to 2 doses if you smoke cigarettes or if you have certain conditions.  Meningococcal vaccine.** / 1 dose if you are age 87 to 44 years and a Market researcher living in a residence hall, or have one of several medical conditions, you need to get vaccinated against meningococcal disease. You may also need additional booster doses.  Hepatitis A vaccine.** / Consult your health care provider.  Hepatitis B vaccine.** / Consult your health care provider.  Haemophilus influenzae type b (Hib) vaccine.** / Consult your health care provider. Ages 86 to 38 years  Blood pressure check.** / Every year.  Lipid and cholesterol check.** / Every 5 years beginning at age 49 years.  Lung cancer screening. / Every year if you are aged 71-80 years and have a 30-pack-year history of smoking and currently smoke or have quit within the past 15 years. Yearly screening is stopped once you have quit smoking for at least 15 years or develop a health problem that would prevent you from having lung cancer treatment.  Clinical breast exam.** / Every year after age 51 years.  BRCA-related cancer risk assessment.** / For women who have family members with a BRCA-related cancer (breast, ovarian, tubal, or peritoneal cancers).  Mammogram.** / Every year beginning at age 18 years and continuing for as long as you are in good health. Consult with your health care provider.  Pap test.** / Every 3 years starting at age 63 years through age 37 or 57 years with a history of 3 consecutive normal Pap tests.  HPV screening.** / Every 3 years from ages 41 years through ages 76 to 23 years with a history of 3 consecutive normal Pap tests.  Fecal occult blood test (FOBT) of stool. / Every year beginning at age 36 years and continuing until age 51 years. You may not need  to do this test if you get a colonoscopy every 10 years.  Flexible sigmoidoscopy or colonoscopy.** / Every 5 years for a flexible sigmoidoscopy or every 10 years for a colonoscopy beginning at age 36 years and continuing until age 35 years.  Hepatitis C blood test.** / For all people born from [birth year] through 1963 and any individual with known risks for hepatitis C.  Skin self-exam. / Monthly.  Influenza vaccine. / Every year.  Tetanus, diphtheria, and acellular pertussis (Tdap/Td) vaccine.** / Consult your health care provider. Pregnant women should receive 1 dose of Tdap vaccine during each pregnancy. 1 dose of Td every 10 years.  Varicella vaccine.** / Consult your health care provider. Pregnant females who do not have evidence of immunity should receive the first dose after pregnancy.  Zoster vaccine.** / 1 dose for adults aged 73 years or older.  Measles, mumps, rubella (MMR) vaccine.** / You need at least 1 dose of MMR if you were born in 1957 or later. You may also need a second dose. For females of childbearing age, rubella immunity should be determined. If there is no evidence of immunity, females who are not pregnant should be vaccinated. If there is no evidence of immunity, females who are pregnant should delay immunization until after pregnancy.  Pneumococcal 13-valent conjugate (PCV13) vaccine.** / Consult your health care provider.  Pneumococcal polysaccharide (PPSV23) vaccine.** / 1 to 2 doses if you smoke cigarettes or if you have certain conditions.  Meningococcal vaccine.** /  Consult your health care provider.  Hepatitis A vaccine.** / Consult your health care provider.  Hepatitis B vaccine.** / Consult your health care provider.  Haemophilus influenzae type b (Hib) vaccine.** / Consult your health care provider. Ages 80 years and over  Blood pressure check.** / Every year.  Lipid and cholesterol check.** / Every 5 years beginning at age 62 years.  Lung cancer  screening. / Every year if you are aged 32-80 years and have a 30-pack-year history of smoking and currently smoke or have quit within the past 15 years. Yearly screening is stopped once you have quit smoking for at least 15 years or develop a health problem that would prevent you from having lung cancer treatment.  Clinical breast exam.** / Every year after age 61 years.  BRCA-related cancer risk assessment.** / For women who have family members with a BRCA-related cancer (breast, ovarian, tubal, or peritoneal cancers).  Mammogram.** / Every year beginning at age 39 years and continuing for as long as you are in good health. Consult with your health care provider.  Pap test.** / Every 3 years starting at age 85 years through age 74 or 72 years with 3 consecutive normal Pap tests. Testing can be stopped between 65 and 70 years with 3 consecutive normal Pap tests and no abnormal Pap or HPV tests in the past 10 years.  HPV screening.** / Every 3 years from ages 55 years through ages 67 or 77 years with a history of 3 consecutive normal Pap tests. Testing can be stopped between 65 and 70 years with 3 consecutive normal Pap tests and no abnormal Pap or HPV tests in the past 10 years.  Fecal occult blood test (FOBT) of stool. / Every year beginning at age 81 years and continuing until age 22 years. You may not need to do this test if you get a colonoscopy every 10 years.  Flexible sigmoidoscopy or colonoscopy.** / Every 5 years for a flexible sigmoidoscopy or every 10 years for a colonoscopy beginning at age 67 years and continuing until age 22 years.  Hepatitis C blood test.** / For all people born from [birth year] through 1963 and any individual with known risks for hepatitis C.  Osteoporosis screening.** / A one-time screening for women ages 8 years and over and women at risk for fractures or osteoporosis.  Skin self-exam. / Monthly.  Influenza vaccine. / Every year.  Tetanus, diphtheria, and  acellular pertussis (Tdap/Td) vaccine.** / 1 dose of Td every 10 years.  Varicella vaccine.** / Consult your health care provider.  Zoster vaccine.** / 1 dose for adults aged 56 years or older.  Pneumococcal 13-valent conjugate (PCV13) vaccine.** / Consult your health care provider.  Pneumococcal polysaccharide (PPSV23) vaccine.** / 1 dose for all adults aged 15 years and older.  Meningococcal vaccine.** / Consult your health care provider.  Hepatitis A vaccine.** / Consult your health care provider.  Hepatitis B vaccine.** / Consult your health care provider.  Haemophilus influenzae type b (Hib) vaccine.** / Consult your health care provider. ** Family history and personal history of risk and conditions may change your health care provider's recommendations.   This information is not intended to replace advice given to you by your health care provider. Make sure you discuss any questions you have with your health care provider.   Document Released: 06/06/2001 Document Revised: 05/01/2014 Document Reviewed: 09/05/2010 Elsevier Interactive Patient Education [employer].

## 2016-01-28 NOTE — Progress Notes (Signed)
Subjective:   Lennart PallJennifer Hayman is a 35 y.o. G0P0000 Caucasian female here for a routine well-woman exam.  Patient's last menstrual period was 01/05/2016.    Current complaints: none PCP: me       does desire labs  Social History: Sexual: heterosexual Marital Status: married Living situation: with spouse Occupation: Environmental managerphotographer Tobacco/alcohol: no tobacco use Illicit drugs: no history of illicit drug use  The following portions of the patient's history were reviewed and updated as appropriate: allergies, current medications, past family history, past medical history, past social history, past surgical history and problem list.  Past Medical History Past Medical History:  Diagnosis Date  . Depression   . Insomnia     Past Surgical History Past Surgical History:  Procedure Laterality Date  . APPENDECTOMY      Gynecologic History G0P0000  Patient's last menstrual period was 01/05/2016. Contraception: OCP (estrogen/progesterone) Last Pap: 2016. Results were: normal   Obstetric History OB History  Gravida Para Term Preterm AB Living  0 0 0 0 0 0  SAB TAB Ectopic Multiple Live Births  0 0 0 0          Current Medications Current Outpatient Prescriptions on File Prior to Visit  Medication Sig Dispense Refill  . cyanocobalamin (,VITAMIN B-12,) 1000 MCG/ML injection Inject 1 mL (1,000 mcg total) into the muscle every 30 (thirty) days. 10 mL 1  . FLUoxetine (PROZAC) 10 MG capsule TAKE 1 CAPSULE (10 MG TOTAL) BY MOUTH 2 (TWO) TIMES DAILY. 60 capsule 3  . norethindrone-ethinyl estradiol-iron (MICROGESTIN FE,GILDESS FE,LOESTRIN FE) 1.5-30 MG-MCG tablet Take 1 tablet by mouth daily.    Marland Kitchen. zolpidem (AMBIEN) 10 MG tablet Take 1 tablet (10 mg total) by mouth at bedtime as needed. for sleep 30 tablet 2  . phentermine (ADIPEX-P) 37.5 MG tablet Take 1 tablet (37.5 mg total) by mouth daily before breakfast. (Patient not taking: Reported on 01/28/2016) 30 tablet 2   Current  Facility-Administered Medications on File Prior to Visit  Medication Dose Route Frequency Provider Last Rate Last Dose  . cyanocobalamin ((VITAMIN B-12)) injection 1,000 mcg  1,000 mcg Intramuscular Once Linzie Criss NIKE Becker Christopher, CNM        Review of Systems Patient denies any headaches, blurred vision, shortness of breath, chest pain, abdominal pain, problems with bowel movements, urination, or intercourse.  Objective:  BP 124/80   Pulse 93   Ht 5\' 5"  (1.651 m)   Wt 183 lb 12.8 oz (83.4 kg)   LMP 01/05/2016   BMI 30.59 kg/m  Physical Exam  General:  Well developed, well nourished, no acute distress. She is alert and oriented x3. Skin:  Warm and dry Neck:  Midline trachea, no thyromegaly or nodules Cardiovascular: Regular rate and rhythm, no murmur heard Lungs:  Effort normal, all lung fields clear to auscultation bilaterally Breasts:  No dominant palpable mass, retraction, or nipple discharge Abdomen:  Soft, non tender, no hepatosplenomegaly or masses Pelvic:  External genitalia is normal in appearance.  The vagina is normal in appearance. The cervix is bulbous, no CMT.  Thin prep pap is not done. Uterus is felt to be normal size, shape, and contour.  No adnexal masses or tenderness noted. Extremities:  No swelling or varicosities noted Psych:  She has a normal mood and affect  Assessment:   Healthy well-woman exam OCP user Sleep disturbance overweight  Plan:  Needs flu vaccine- given today along with planned B12. F/U 1 year for AE, or sooner if needed   Honor Fairbank N  Gayla Medicus, CNM

## 2016-05-19 ENCOUNTER — Encounter: Payer: Self-pay | Admitting: Obstetrics and Gynecology

## 2016-05-19 ENCOUNTER — Other Ambulatory Visit: Payer: Self-pay | Admitting: Obstetrics and Gynecology

## 2016-06-12 ENCOUNTER — Other Ambulatory Visit: Payer: Self-pay | Admitting: *Deleted

## 2016-06-12 MED ORDER — NORETHIN ACE-ETH ESTRAD-FE 1.5-30 MG-MCG PO TABS: 1.0000 | ORAL_TABLET | Freq: Every day | ORAL | 11 refills | Status: DC

## 2016-06-19 ENCOUNTER — Other Ambulatory Visit: Payer: Self-pay | Admitting: Obstetrics and Gynecology

## 2016-08-03 ENCOUNTER — Encounter: Payer: Self-pay | Admitting: Medical

## 2016-08-03 ENCOUNTER — Ambulatory Visit: Payer: Self-pay | Admitting: Medical

## 2016-08-03 VITALS — BP 104/72 | HR 94 | Temp 99.4°F | Resp 16 | Ht 65.0 in | Wt 194.0 lb

## 2016-08-03 DIAGNOSIS — M79675 Pain in left toe(s): Secondary | ICD-10-CM

## 2016-08-03 MED ORDER — PREDNISONE 10 MG (21) PO TBPK: ORAL_TABLET | ORAL | 0 refills | Status: DC

## 2016-08-03 NOTE — Progress Notes (Signed)
Patient woke up this morning with pain in great toe of the left foot.  Tender to the touch in the joint.  She just recently started a keto diet - high fat and protein about 10 days ago.

## 2016-08-03 NOTE — Progress Notes (Signed)
   Subjective:    Patient ID: Angela Knox, female    DOB: August 03, 1980, 36 y.o.   MRN: [id number]  HPI  Woke up this am with swollen red left great toe. Changed diet April 1st to Keto diet.     Review of Systems  Constitutional: Negative for chills and fever.  HENT: Negative.   Eyes: Negative.   Respiratory: Negative.   Cardiovascular: Negative.   Skin: Positive for rash.  Rash on Sunday bothe upper arms after canned tuna ( never had tuna before).    Objective:   Physical Exam  Constitutional: She appears well-developed and well-nourished.  HENT:  Head: Normocephalic and atraumatic.  Eyes: EOM are normal. Pupils are equal, round, and reactive to light.  Cardiovascular: Normal rate, regular rhythm and normal heart sounds.  Exam reveals no gallop and no friction rub.   No murmur heard. Pulmonary/Chest: Effort normal and breath sounds normal.   Left great toe at distal phalanx joint  swollen , with  erythema and with warmth. Limited ROM secondary to swelling. <2s CR.       Assessment & Plan:  Left great toe pain, possibly gout prescribed prednisone taper  6 pills by mouth today , then 5 pills tomorrow then one less every day till gone , take with food. Will check CBC , Met C , and uric acid. Elevate , patient states she can take ibuprofen , take as directed. Reviewed foods to avoid ( meats with purines, shellfish, beer etc). Will call patient with lab results.

## 2016-08-04 LAB — CBC WITH DIFFERENTIAL/PLATELET
BASOS ABS: 0 10*3/uL (ref 0.0–0.2)
Basos: 0 %
EOS (ABSOLUTE): 0.2 10*3/uL (ref 0.0–0.4)
Eos: 2 %
Hematocrit: 37.9 % (ref 34.0–46.6)
Hemoglobin: 12.4 g/dL (ref 11.1–15.9)
IMMATURE GRANS (ABS): 0 10*3/uL (ref 0.0–0.1)
Immature Granulocytes: 0 %
Lymphocytes Absolute: 1.9 10*3/uL (ref 0.7–3.1)
Lymphs: 24 %
MCH: 27.7 pg (ref 26.6–33.0)
MCHC: 32.7 g/dL (ref 31.5–35.7)
MCV: 85 fL (ref 79–97)
Monocytes Absolute: 0.7 10*3/uL (ref 0.1–0.9)
Monocytes: 8 %
NEUTROS ABS: 5.3 10*3/uL (ref 1.4–7.0)
Neutrophils: 66 %
PLATELETS: 287 10*3/uL (ref 150–379)
RBC: 4.48 x10E6/uL (ref 3.77–5.28)
RDW: 13.7 % (ref 12.3–15.4)
WBC: 8.1 10*3/uL (ref 3.4–10.8)

## 2016-08-04 LAB — COMPREHENSIVE METABOLIC PANEL
ALBUMIN: 4.6 g/dL (ref 3.5–5.5)
ALK PHOS: 89 IU/L (ref 39–117)
ALT: 15 IU/L (ref 0–32)
AST: 19 IU/L (ref 0–40)
Albumin/Globulin Ratio: 2.1 (ref 1.2–2.2)
BUN / CREAT RATIO: 18 (ref 9–23)
BUN: 17 mg/dL (ref 6–20)
Bilirubin Total: <0.2 mg/dL (ref 0.0–1.2)
CALCIUM: 9.2 mg/dL (ref 8.7–10.2)
CHLORIDE: 100 mmol/L (ref 96–106)
CO2: 22 mmol/L (ref 18–29)
CREATININE: 0.92 mg/dL (ref 0.57–1.00)
GFR calc Af Amer: 95 mL/min/{1.73_m2} (ref >59)
GFR, EST NON AFRICAN AMERICAN: 82 mL/min/{1.73_m2} (ref >59)
GLOBULIN, TOTAL: 2.2 g/dL (ref 1.5–4.5)
GLUCOSE: 107 mg/dL — AB (ref 65–99)
POTASSIUM: 4.2 mmol/L (ref 3.5–5.2)
Sodium: 139 mmol/L (ref 134–144)
Total Protein: 6.8 g/dL (ref 6.0–8.5)

## 2016-08-04 LAB — URIC ACID: URIC ACID: 5.3 mg/dL (ref 2.5–7.1)

## 2016-08-07 ENCOUNTER — Telehealth: Payer: Self-pay | Admitting: Medical

## 2016-08-07 NOTE — Telephone Encounter (Signed)
Called patient to see how her left great toe was doing, she says it is much better. Reviewed lab results. Uric acid 5.3 .  Reviewed with patient it seemed like gout. Though a mild case of it. Return to the clinic if it occurs again or any concerns.

## 2016-09-05 ENCOUNTER — Encounter: Payer: Self-pay | Admitting: Obstetrics and Gynecology

## 2016-09-06 ENCOUNTER — Other Ambulatory Visit: Payer: Self-pay | Admitting: Obstetrics and Gynecology

## 2016-09-06 MED ORDER — PHENTERMINE HCL 37.5 MG PO TABS: 37.5000 mg | ORAL_TABLET | Freq: Every day | ORAL | 2 refills | Status: DC

## 2016-09-07 ENCOUNTER — Ambulatory Visit (INDEPENDENT_AMBULATORY_CARE_PROVIDER_SITE_OTHER): Payer: BLUE CROSS/BLUE SHIELD | Admitting: Obstetrics and Gynecology

## 2016-09-07 ENCOUNTER — Encounter: Payer: Self-pay | Admitting: Obstetrics and Gynecology

## 2016-09-07 VITALS — BP 103/84 | HR 78 | Ht 65.0 in | Wt 198.3 lb

## 2016-09-07 DIAGNOSIS — E663 Overweight: Secondary | ICD-10-CM | POA: Diagnosis not present

## 2016-09-07 MED ORDER — ZOLPIDEM TARTRATE 10 MG PO TABS: 10.0000 mg | ORAL_TABLET | Freq: Every evening | ORAL | 2 refills | Status: DC | PRN

## 2016-09-07 NOTE — Progress Notes (Signed)
Subjective:  Angela PallJennifer Knox is a 36 y.o. G0P0000 at Unknown being seen today for weight loss management- initial visit.  Patient reports General ROS: negative and reports previous weight loss attempts:  Past treatment has included: small frequent feedings, nutritional supplement, vitamin supplement, vitamin B-12 injections, appetite stimulant, exercise management  The following portions of the patient's history were reviewed and updated as appropriate: allergies, current medications, past family history, past medical history, past social history, past surgical history and problem list.   Objective:   Vitals:   09/07/16 1010  BP: 103/84  Pulse: 78  Weight: 198 lb 4.8 oz (89.9 kg)  Height: 5\' 5"  (1.651 m)    General:  Alert, oriented and cooperative. Patient is in no acute distress.  :   :   :   :   :   :   PE: Well groomed female in no current distress,   Mental Status: Normal mood and affect. Normal behavior. Normal judgment and thought content.   Current BMI: Body mass index is 33 kg/m.   Assessment and Plan:  Obesity  There are no diagnoses linked to this encounter.  Plan: low carb, High protein diet RX for adipex 37.5 mg daily and B12 1000mcg.ml monthly, to start now with first injection given at today's visit. Reviewed side-effects common to both medications and expected outcomes. Increase daily water intake to at least 8 bottle a day, every day.  Goal is to reduse weight by 10% by end of three months, and will re-evaluate then.  RTC in 4 weeks for Nurse visit to check weight & BP, and get next B12 injections.    Please refer to After Visit Summary for other counseling recommendations.    [place], Melody N, CNM   Melody NavasotaN Shambley, CNM      Consider the Low Glycemic Index Diet and 6 smaller meals daily .  This boosts your metabolism and regulates your sugars:   Use the protein bar by Atkins because they have lots of fiber in them  Find the low carb  flatbreads, tortillas and pita breads for sandwiches:  Joseph's makes a pita bread and a flat bread , available at [facility] and [employer]; Toufayah makes a low carb flatbread available at [employer] and HT that is 9 net carbs and 100 cal Mission makes a low carb whole wheat tortilla available at [employer],and most grocery stores with 6 net carbs and 210 cal  [place] yogurt can still have a lot of carbs .  Dannon Light N fit has 80 cal and 8 carbs

## 2016-10-06 ENCOUNTER — Ambulatory Visit (INDEPENDENT_AMBULATORY_CARE_PROVIDER_SITE_OTHER): Payer: BLUE CROSS/BLUE SHIELD | Admitting: Obstetrics and Gynecology

## 2016-10-06 VITALS — BP 105/81 | HR 84 | Ht 65.0 in | Wt 196.3 lb

## 2016-10-06 DIAGNOSIS — E669 Obesity, unspecified: Secondary | ICD-10-CM

## 2016-10-06 MED ORDER — CYANOCOBALAMIN 1000 MCG/ML IJ SOLN
1000.0000 ug | Freq: Once | INTRAMUSCULAR | 0 refills | Status: AC
Start: 2016-10-06 — End: 2016-10-06

## 2016-10-06 MED ORDER — CYANOCOBALAMIN 1000 MCG/ML IJ SOLN
1000.0000 ug | Freq: Once | INTRAMUSCULAR | Status: AC
  Administered 2016-10-06: 1000 ug via INTRAMUSCULAR

## 2016-10-06 NOTE — Progress Notes (Signed)
Patient ID: Angela PallJennifer Knox, female   DOB: Aug 19, 1980, 36 y.o.   MRN: [id number] Pt presents for weight, B/P, B-12 injection. No side effects of medication-Phentermine, or B-12.  Weight loss of 2 lbs. Encouraged eating healthy and exercise. B-12 Rx sent to pharmacy.

## 2016-11-03 ENCOUNTER — Ambulatory Visit: Payer: BLUE CROSS/BLUE SHIELD | Admitting: Obstetrics and Gynecology

## 2016-11-06 ENCOUNTER — Encounter: Payer: Self-pay | Admitting: Obstetrics and Gynecology

## 2016-11-06 ENCOUNTER — Ambulatory Visit: Payer: BLUE CROSS/BLUE SHIELD | Admitting: Obstetrics and Gynecology

## 2016-11-06 VITALS — BP 123/84 | HR 88 | Wt 195.4 lb

## 2016-11-06 DIAGNOSIS — Z79899 Other long term (current) drug therapy: Secondary | ICD-10-CM

## 2016-11-06 NOTE — Progress Notes (Signed)
Pt is here for wt, bp check,b-12 inj, she has been very busy, bought a house, has been packing and moving   11/06/16 wt- 195 10/06/16 wt-196

## 2016-11-14 ENCOUNTER — Ambulatory Visit: Payer: Self-pay | Admitting: Medical

## 2016-11-14 ENCOUNTER — Encounter: Payer: Self-pay | Admitting: Medical

## 2016-11-14 VITALS — BP 108/70 | HR 89 | Temp 97.8°F | Resp 16 | Ht 65.0 in | Wt 198.0 lb

## 2016-11-14 DIAGNOSIS — S39012A Strain of muscle, fascia and tendon of lower back, initial encounter: Secondary | ICD-10-CM

## 2016-11-14 MED ORDER — IBUPROFEN 800 MG PO TABS: 800.0000 mg | ORAL_TABLET | Freq: Three times a day (TID) | ORAL | 0 refills | Status: DC | PRN

## 2016-11-14 MED ORDER — CYCLOBENZAPRINE HCL 5 MG PO TABS: 5.0000 mg | ORAL_TABLET | Freq: Every day | ORAL | 0 refills | Status: DC

## 2016-11-14 NOTE — Progress Notes (Signed)
   Subjective:    Patient ID: Angela PallJennifer Knox, female    DOB: November 10, 1980, 36 y.o.   MRN: [id number]  HPI 36 yo female packing to move in  2 weeks ,  Unsure of what caused her back pain.  Mid to lower area. No pain in arms or legs, no numbness or tingling. Nothing makes it worse, " I cannot get comfortable". Nothing makes it better.  Taking Ibuprofen 400 mg every  6 hours.    Review of Systems  Constitutional: Negative for chills and fever.  HENT: Negative for congestion, ear pain and sore throat.   Eyes: Negative for discharge and itching.  Respiratory: Negative for cough and shortness of breath.   Cardiovascular: Negative for chest pain and palpitations.  Gastrointestinal: Negative for abdominal pain.  Endocrine: Negative for polydipsia, polyphagia and polyuria.  Genitourinary: Negative for dysuria and hematuria.  Musculoskeletal: Positive for back pain. Negative for gait problem.  Skin: Negative for rash.  Allergic/Immunologic: Positive for environmental allergies and food allergies.  Neurological: Negative for dizziness, syncope and headaches.  Hematological: Negative for adenopathy.  Psychiatric/Behavioral: Negative for behavioral problems, confusion and hallucinations.       Objective:   Physical Exam  Constitutional: She is oriented to person, place, and time. She appears well-developed and well-nourished.  HENT:  Head: Normocephalic and atraumatic.  Eyes: Pupils are equal, round, and reactive to light. Conjunctivae and EOM are normal.  Neck: Neck supple.  Cardiovascular: Normal rate and regular rhythm.  Exam reveals friction rub. Exam reveals no gallop.   No murmur heard. Pulmonary/Chest: Effort normal and breath sounds normal.  Musculoskeletal: Normal range of motion. She exhibits tenderness.       Lumbar back: She exhibits tenderness, swelling, edema, deformity and pain. She exhibits normal range of motion and no bony tenderness.       Back:       Arms: Neurological:  She is alert and oriented to person, place, and time.  Skin: Skin is warm and dry.  Psychiatric: She has a normal mood and affect. Her behavior is normal. Judgment and thought content normal.  Nursing note and vitals reviewed.    Muscle spasm noted on diagram paravertebral  spasm On the left more than right side mid to lower back area. Skin within normal limits 5/5 strength on upper arm and lower leg flexion and extension.    Assessment & Plan:  Back Strain alternate heat and ice every  20 minutes. E-prescribed Flexeril 5 mg one by mouth at bedtime #15 no refills. Ibuprofen 800 mg one by mouth three times a day with food. Patient can take OTC ibuprofen 400 mg  without any reaction. If itching occurs at the higher dose to take OTC Benadryl 25 mg by mouth . Cool showers. Call office to communicate with me. Patient verbalizes understanding and has no questions at discharge.

## 2016-11-14 NOTE — Patient Instructions (Signed)
Lumbosacral Strain Lumbosacral strain is an injury that causes pain in the lower back (lumbosacral spine). This injury usually occurs from overstretching the muscles or ligaments along your spine. A strain can affect one or more muscles or cord-like tissues that connect bones to other bones (ligaments). What are the causes? This condition may be caused by:  A hard, direct hit (blow) to the back.  Excessive stretching of the lower back muscles. This may result from: ? A fall. ? Lifting something heavy. ? Repetitive movements such as bending or crouching.  What increases the risk? The following factors may increase your risk of getting this condition:  Participating in sports or activities that involve: ? A sudden twist of the back. ? Pushing or pulling motions.  Being overweight or obese.  Having poor strength and flexibility, especially tight hamstrings or weak muscles in the back or abdomen.  Having too much of a curve in the lower back.  Having a pelvis that is tilted forward.  What are the signs or symptoms? The main symptom of this condition is pain in the lower back, at the site of the strain. Pain may extend (radiate) down one or both legs. How is this diagnosed? This condition is diagnosed based on:  Your symptoms.  Your medical history.  A physical exam. ? Your health care provider may push on certain areas of your back to determine the source of your pain. ? You may be asked to bend forward, backward, and side to side to assess the severity of your pain and your range of motion.  Imaging tests, such as: ? X-rays. ? MRI.  How is this treated? Treatment for this condition may include:  Putting heat and cold on the affected area.  Medicines to help relieve pain and relax your muscles (muscle relaxants).  NSAIDs to help reduce swelling and discomfort.  When your symptoms improve, it is important to gradually return to your normal routine as soon as possible  to reduce pain, avoid stiffness, and avoid loss of muscle strength. Generally, symptoms should improve within 6 weeks of treatment. However, recovery time varies. Follow these instructions at home: Managing pain, stiffness, and swelling   If directed, put ice on the injured area during the first 24 hours after your strain. ? Put ice in a plastic bag. ? Place a towel between your skin and the bag. ? Leave the ice on for 20 minutes, 2-3 times a day.  If directed, put heat on the affected area as often as told by your health care provider. Use the heat source that your health care provider recommends, such as a moist heat pack or a heating pad. ? Place a towel between your skin and the heat source. ? Leave the heat on for 20-30 minutes. ? Remove the heat if your skin turns bright red. This is especially important if you are unable to feel pain, heat, or cold. You may have a greater risk of getting burned. Activity  Rest and return to your normal activities as told by your health care provider. Ask your health care provider what activities are safe for you.  Avoid activities that take a lot of energy for as long as told by your health care provider. General instructions  Take over-the-counter and prescription medicines only as told by your health care provider.  Donot drive or use heavy machinery while taking prescription pain medicine.  Do not use any products that contain nicotine or tobacco, such as cigarettes and e-cigarettes.   If you need help quitting, ask your health care provider.  Keep all follow-up visits as told by your health care provider. This is important. How is this prevented?  Use correct form when playing sports and lifting heavy objects.  Use good posture when sitting and standing.  Maintain a healthy weight.  Sleep on a mattress with medium firmness to support your back.  Be safe and responsible while being active to avoid falls.  Do at least 150 minutes of  moderate-intensity exercise each week, such as brisk walking or water aerobics. Try a form of exercise that takes stress off your back, such as swimming or stationary cycling.  Maintain physical fitness, including: ? Strength. ? Flexibility. ? Cardiovascular fitness. ? Endurance. Contact a health care provider if:  Your back pain does not improve after 6 weeks of treatment.  Your symptoms get worse. Get help right away if:  Your back pain is severe.  You cannot stand or walk.  You have difficulty controlling when you urinate or when you have a bowel movement.  You feel nauseous or you vomit.  Your feet get very cold.  You have numbness, tingling, weakness, or problems using your arms or legs.  You develop any of the following: ? Shortness of breath. ? Dizziness. ? Pain in your legs. ? Weakness in your buttocks or legs. ? Discoloration of the skin on your toes or legs. This information is not intended to replace advice given to you by your health care provider. Make sure you discuss any questions you have with your health care provider. Document Released: 01/18/2005 Document Revised: 10/29/2015 Document Reviewed: 09/12/2015 Elsevier Interactive Patient Education  2017 Elsevier Inc.  

## 2016-12-17 ENCOUNTER — Other Ambulatory Visit: Payer: Self-pay | Admitting: Obstetrics and Gynecology

## 2016-12-18 ENCOUNTER — Encounter: Payer: Self-pay | Admitting: Obstetrics and Gynecology

## 2016-12-18 ENCOUNTER — Other Ambulatory Visit: Payer: Self-pay | Admitting: Obstetrics and Gynecology

## 2016-12-19 ENCOUNTER — Other Ambulatory Visit: Payer: Self-pay | Admitting: Obstetrics and Gynecology

## 2016-12-19 ENCOUNTER — Other Ambulatory Visit: Payer: Self-pay | Admitting: *Deleted

## 2016-12-19 MED ORDER — ZOLPIDEM TARTRATE 10 MG PO TABS: 10.0000 mg | ORAL_TABLET | Freq: Every evening | ORAL | 2 refills | Status: DC | PRN

## 2016-12-19 NOTE — Telephone Encounter (Signed)
Please make sure patient schedules AE on Oct 11th or later,

## 2016-12-25 ENCOUNTER — Ambulatory Visit: Payer: Self-pay | Admitting: Medical

## 2016-12-25 VITALS — BP 100/68 | HR 96 | Temp 98.1°F | Resp 18 | Ht 65.0 in | Wt 198.0 lb

## 2016-12-25 DIAGNOSIS — H6690 Otitis media, unspecified, unspecified ear: Secondary | ICD-10-CM

## 2016-12-25 DIAGNOSIS — J0100 Acute maxillary sinusitis, unspecified: Secondary | ICD-10-CM

## 2016-12-25 MED ORDER — AMOXICILLIN 875 MG PO TABS: 875.0000 mg | ORAL_TABLET | Freq: Two times a day (BID) | ORAL | 0 refills | Status: DC

## 2016-12-25 NOTE — Progress Notes (Addendum)
   Subjective:    Patient ID: Angela Knox, female    DOB: 1981-01-18, 36 y.o.   MRN: [id number]  HPI 36 yo female presents today with 4 days of pain / pressure forehead and then moved down to upper cheeks and jaw area. Right ear pain.     Review of Systems  Constitutional: Positive for activity change, chills and fatigue. Negative for fever.  HENT: Positive for ear pain, sinus pain, sinus pressure, sore throat and voice change. Negative for congestion, hearing loss, postnasal drip, rhinorrhea, tinnitus and trouble swallowing.   Eyes: Negative for discharge and itching.  Respiratory: Positive for cough. Negative for shortness of breath.   Cardiovascular: Negative for chest pain.  Gastrointestinal: Negative for abdominal pain.  Endocrine: Negative for polydipsia, polyphagia and polyuria.  Genitourinary: Negative for dysuria.  Musculoskeletal: Negative for back pain and neck pain.  Skin: Negative for rash.  Allergic/Immunologic: Positive for environmental allergies. Negative for food allergies.  Neurological: Positive for light-headedness and headaches. Negative for dizziness and syncope.  Hematological: Negative for adenopathy.  Psychiatric/Behavioral: Negative for agitation and behavioral problems. The patient is not nervous/anxious.        Objective:   Physical Exam  Constitutional: She is oriented to person, place, and time. She appears well-developed and well-nourished.  HENT:  Head: Normocephalic and atraumatic.  Right Ear: External ear and ear canal normal. No mastoid tenderness. Tympanic membrane is injected and erythematous.  Left Ear: Hearing, tympanic membrane, external ear and ear canal normal.  Nose: Mucosal edema present. Right sinus exhibits no maxillary sinus tenderness and no frontal sinus tenderness. Left sinus exhibits no maxillary sinus tenderness and no frontal sinus tenderness.  Mouth/Throat: Uvula is midline and oropharynx is clear and moist.  Eyes: Pupils  are equal, round, and reactive to light. Conjunctivae and EOM are normal.  Neck: Normal range of motion. Neck supple.  Cardiovascular: Normal rate and regular rhythm.  Exam reveals no gallop and no friction rub.   No murmur heard. Pulmonary/Chest: Effort normal and breath sounds normal.  Lymphadenopathy:    She has no cervical adenopathy.  Neurological: She is alert and oriented to person, place, and time.  Skin: Skin is warm and dry.  Psychiatric: She has a normal mood and affect. Her behavior is normal. Judgment and thought content normal.  Nursing note and vitals reviewed.         Assessment & Plan:  Otitis media right / Sinusitis  Meds ordered this encounter  Medications  . cetirizine (ZYRTEC) 10 MG tablet    Sig: Take 10 mg by mouth daily.  Marland Kitchen. amoxicillin (AMOXIL) 875 MG tablet    Sig: Take 1 tablet (875 mg total) by mouth 2 (two) times daily.    Dispense:  20 tablet    Refill:  0  given Ibuproden 400mg  by mouth in clinic 5690 exp 02/2019 Rest, increase fluids, OTC flonase take as directed, return in  3-5 d if not improving.

## 2016-12-25 NOTE — Patient Instructions (Addendum)
Rest, increase fluids, OTC flonase take as directed, return in  3-5 d if not improving. Otitis Media, Adult Otitis media is redness, soreness, and puffiness (swelling) in the space just behind your eardrum (middle ear). It may be caused by allergies or infection. It often happens along with a cold. Follow these instructions at home:  Take your medicine as told. Finish it even if you start to feel better.  Only take over-the-counter or prescription medicines for pain, discomfort, or fever as told by your doctor.  Follow up with your doctor as told. Contact a doctor if:  You have otitis media only in one ear, or bleeding from your nose, or both.  You notice a lump on your neck.  You are not getting better in 3-5 days.  You feel worse instead of better. Get help right away if:  You have pain that is not helped with medicine.  You have puffiness, redness, or pain around your ear.  You get a stiff neck.  You cannot move part of your face (paralysis).  You notice that the bone behind your ear hurts when you touch it. This information is not intended to replace advice given to you by your health care provider. Make sure you discuss any questions you have with your health care provider. Document Released: 09/27/2007 Document Revised: 09/16/2015 Document Reviewed: 11/05/2012 Elsevier Interactive Patient Education  2017 Elsevier Inc. Sinusitis, Adult Sinusitis is soreness and inflammation of your sinuses. Sinuses are hollow spaces in the bones around your face. They are located:  Around your eyes.  In the middle of your forehead.  Behind your nose.  In your cheekbones.  Your sinuses and nasal passages are lined with a stringy fluid (mucus). Mucus normally drains out of your sinuses. When your nasal tissues get inflamed or swollen, the mucus can get trapped or blocked so air cannot flow through your sinuses. This lets bacteria, viruses, and funguses grow, and that leads to  infection. Follow these instructions at home: Medicines  Take, use, or apply over-the-counter and prescription medicines only as told by your doctor. These may include nasal sprays.  If you were prescribed an antibiotic medicine, take it as told by your doctor. Do not stop taking the antibiotic even if you start to feel better. Hydrate and Humidify  Drink enough water to keep your pee (urine) clear or pale yellow.  Use a cool mist humidifier to keep the humidity level in your home above 50%.  Breathe in steam for 10-15 minutes, 3-4 times a day or as told by your doctor. You can do this in the bathroom while a hot shower is running.  Try not to spend time in cool or dry air. Rest  Rest as much as possible.  Sleep with your head raised (elevated).  Make sure to get enough sleep each night. General instructions  Put a warm, moist washcloth on your face 3-4 times a day or as told by your doctor. This will help with discomfort.  Wash your hands often with soap and water. If there is no soap and water, use hand sanitizer.  Do not smoke. Avoid being around people who are smoking (secondhand smoke).  Keep all follow-up visits as told by your doctor. This is important. Contact a doctor if:  You have a fever.  Your symptoms get worse.  Your symptoms do not get better within 10 days. Get help right away if:  You have a very bad headache.  You cannot stop throwing  up (vomiting).  You have pain or swelling around your face or eyes.  You have trouble seeing.  You feel confused.  Your neck is stiff.  You have trouble breathing. This information is not intended to replace advice given to you by your health care provider. Make sure you discuss any questions you have with your health care provider. Document Released: 09/27/2007 Document Revised: 12/05/2015 Document Reviewed: 02/03/2015 Elsevier Interactive Patient Education  [employer]2018 Else[employer] Inc.

## 2017-02-04 ENCOUNTER — Encounter: Payer: Self-pay | Admitting: Obstetrics and Gynecology

## 2017-02-08 ENCOUNTER — Encounter: Payer: Self-pay | Admitting: Obstetrics and Gynecology

## 2017-02-08 ENCOUNTER — Ambulatory Visit (INDEPENDENT_AMBULATORY_CARE_PROVIDER_SITE_OTHER): Payer: BLUE CROSS/BLUE SHIELD | Admitting: Obstetrics and Gynecology

## 2017-02-08 VITALS — BP 132/81 | HR 114 | Ht 65.0 in | Wt 199.9 lb

## 2017-02-08 DIAGNOSIS — N926 Irregular menstruation, unspecified: Secondary | ICD-10-CM

## 2017-02-08 LAB — POCT URINE PREGNANCY: Preg Test, Ur: POSITIVE — AB

## 2017-02-08 NOTE — Progress Notes (Signed)
Subjective:     Patient ID: Angela Knox, female   DOB: 08/23/1980, 36 y.o.   MRN: [id number]  HPI  Here for pregnancy confirmation. States normal LMP 01/02/17 and 2 days of light spotting on 9/26 & 27, giving EDC 10/09/17 and EGA 1540w2d. Denies any symptoms except tiredness. Happy about planned pregnancy.  Review of Systems Negative except stated above in HPI    Objective:   Physical Exam A&Ox4 Well groomed female in no distress Blood pressure 132/81, pulse (!) 114, height 5\' 5"  (1.651 m), weight 199 lb 14.4 oz (90.7 kg), last menstrual period 01/02/2017. UPT+    Assessment:     Missed menses    Plan:     RTC in 2 weeks for viability scan and nurse intake/labs and then 6 weeks for New OB with me.   Melody Shambley,CNM

## 2017-02-08 NOTE — Patient Instructions (Signed)
First Trimester of Pregnancy The first trimester of pregnancy is from week 1 until the end of week 13 (months 1 through 3). A week after a sperm fertilizes an egg, the egg will implant on the wall of the uterus. This embryo will begin to develop into a baby. Genes from you and your partner will form the baby. The female genes will determine whether the baby will be a boy or a girl. At 6-8 weeks, the eyes and face will be formed, and the heartbeat can be seen on ultrasound. At the end of 12 weeks, all the baby's organs will be formed. Now that you are pregnant, you will want to do everything you can to have a healthy baby. Two of the most important things are to get good prenatal care and to follow your health care provider's instructions. Prenatal care is all the medical care you receive before the baby's birth. This care will help prevent, find, and treat any problems during the pregnancy and childbirth. Body changes during your first trimester Your body goes through many changes during pregnancy. The changes vary from woman to woman.  You may gain or lose a couple of pounds at first.  You may feel sick to your stomach (nauseous) and you may throw up (vomit). If the vomiting is uncontrollable, call your health care provider.  You may tire easily.  You may develop headaches that can be relieved by medicines. All medicines should be approved by your health care provider.  You may urinate more often. Painful urination may mean you have a bladder infection.  You may develop heartburn as a result of your pregnancy.  You may develop constipation because certain hormones are causing the muscles that push stool through your intestines to slow down.  You may develop hemorrhoids or swollen veins (varicose veins).  Your breasts may begin to grow larger and become tender. Your nipples may stick out more, and the tissue that surrounds them (areola) may become darker.  Your gums may bleed and may be  sensitive to brushing and flossing.  Dark spots or blotches (chloasma, mask of pregnancy) may develop on your face. This will likely fade after the baby is born.  Your menstrual periods will stop.  You may have a loss of appetite.  You may develop cravings for certain kinds of food.  You may have changes in your emotions from day to day, such as being excited to be pregnant or being concerned that something may go wrong with the pregnancy and baby.  You may have more vivid and Grasmick dreams.  You may have changes in your hair. These can include thickening of your hair, rapid growth, and changes in texture. Some women also have hair loss during or after pregnancy, or hair that feels dry or thin. Your hair will most likely return to normal after your baby is born.  What to expect at prenatal visits During a routine prenatal visit:  You will be weighed to make sure you and the baby are growing normally.  Your blood pressure will be taken.  Your abdomen will be measured to track your baby's growth.  The fetal heartbeat will be listened to between weeks 10 and 14 of your pregnancy.  Test results from any previous visits will be discussed.  Your health care provider may ask you:  How you are feeling.  If you are feeling the baby move.  If you have had any abnormal symptoms, such as leaking fluid, bleeding, severe headaches,   or abdominal cramping.  If you are using any tobacco products, including cigarettes, chewing tobacco, and electronic cigarettes.  If you have any questions.  Other tests that may be performed during your first trimester include:  Blood tests to find your blood type and to check for the presence of any previous infections. The tests will also be used to check for low iron levels (anemia) and protein on red blood cells (Rh antibodies). Depending on your risk factors, or if you previously had diabetes during pregnancy, you may have tests to check for high blood  sugar that affects pregnant women (gestational diabetes).  Urine tests to check for infections, diabetes, or protein in the urine.  An ultrasound to confirm the proper growth and development of the baby.  Fetal screens for spinal cord problems (spina bifida) and Down syndrome.  HIV (human immunodeficiency virus) testing. Routine prenatal testing includes screening for HIV, unless you choose not to have this test.  You may need other tests to make sure you and the baby are doing well.  Follow these instructions at home: Medicines  Follow your health care provider's instructions regarding medicine use. Specific medicines may be either safe or unsafe to take during pregnancy.  Take a prenatal vitamin that contains at least 600 micrograms (mcg) of folic acid.  If you develop constipation, try taking a stool softener if your health care provider approves. Eating and drinking  Eat a balanced diet that includes fresh fruits and vegetables, whole grains, good sources of protein such as meat, eggs, or tofu, and low-fat dairy. Your health care provider will help you determine the amount of weight gain that is right for you.  Avoid raw meat and uncooked cheese. These carry germs that can cause birth defects in the baby.  Eating four or five small meals rather than three large meals a day may help relieve nausea and vomiting. If you start to feel nauseous, eating a few soda crackers can be helpful. Drinking liquids between meals, instead of during meals, also seems to help ease nausea and vomiting.  Limit foods that are high in fat and processed sugars, such as fried and sweet foods.  To prevent constipation: ? Eat foods that are high in fiber, such as fresh fruits and vegetables, whole grains, and beans. ? Drink enough fluid to keep your urine clear or pale yellow. Activity  Exercise only as directed by your health care provider. Most women can continue their usual exercise routine during  pregnancy. Try to exercise for 30 minutes at least 5 days a week. Exercising will help you: ? Control your weight. ? Stay in shape. ? Be prepared for labor and delivery.  Experiencing pain or cramping in the lower abdomen or lower back is a good sign that you should stop exercising. Check with your health care provider before continuing with normal exercises.  Try to avoid standing for long periods of time. Move your legs often if you must stand in one place for a long time.  Avoid heavy lifting.  Wear low-heeled shoes and practice good posture.  You may continue to have sex unless your health care provider tells you not to. Relieving pain and discomfort  Wear a good support bra to relieve breast tenderness.  Take warm sitz baths to soothe any pain or discomfort caused by hemorrhoids. Use hemorrhoid cream if your health care provider approves.  Rest with your legs elevated if you have leg cramps or low back pain.  If you develop   varicose veins in your legs, wear support hose. Elevate your feet for 15 minutes, 3-4 times a day. Limit salt in your diet. Prenatal care  Schedule your prenatal visits by the twelfth week of pregnancy. They are usually scheduled monthly at first, then more often in the last 2 months before delivery.  Write down your questions. Take them to your prenatal visits.  Keep all your prenatal visits as told by your health care provider. This is important. Safety  Wear your seat belt at all times when driving.  Make a list of emergency phone numbers, including numbers for family, friends, the hospital, and police and fire departments. General instructions  Ask your health care provider for a referral to a local prenatal education class. Begin classes no later than the beginning of month 6 of your pregnancy.  Ask for help if you have counseling or nutritional needs during pregnancy. Your health care provider can offer advice or refer you to specialists for help  with various needs.  Do not use hot tubs, steam rooms, or saunas.  Do not douche or use tampons or scented sanitary pads.  Do not cross your legs for long periods of time.  Avoid cat litter boxes and soil used by cats. These carry germs that can cause birth defects in the baby and possibly loss of the fetus by miscarriage or stillbirth.  Avoid all smoking, herbs, alcohol, and medicines not prescribed by your health care provider. Chemicals in these products affect the formation and growth of the baby.  Do not use any products that contain nicotine or tobacco, such as cigarettes and e-cigarettes. If you need help quitting, ask your health care provider. You may receive counseling support and other resources to help you quit.  Schedule a dentist appointment. At home, brush your teeth with a soft toothbrush and be gentle when you floss. Contact a health care provider if:  You have dizziness.  You have mild pelvic cramps, pelvic pressure, or nagging pain in the abdominal area.  You have persistent nausea, vomiting, or diarrhea.  You have a bad smelling vaginal discharge.  You have pain when you urinate.  You notice increased swelling in your face, hands, legs, or ankles.  You are exposed to fifth disease or chickenpox.  You are exposed to German measles (rubella) and have never had it. Get help right away if:  You have a fever.  You are leaking fluid from your vagina.  You have spotting or bleeding from your vagina.  You have severe abdominal cramping or pain.  You have rapid weight gain or loss.  You vomit blood or material that looks like coffee grounds.  You develop a severe headache.  You have shortness of breath.  You have any kind of trauma, such as from a fall or a car accident. Summary  The first trimester of pregnancy is from week 1 until the end of week 13 (months 1 through 3).  Your body goes through many changes during pregnancy. The changes vary from  woman to woman.  You will have routine prenatal visits. During those visits, your health care provider will examine you, discuss any test results you may have, and talk with you about how you are feeling. This information is not intended to replace advice given to you by your health care provider. Make sure you discuss any questions you have with your health care provider. Document Released: 04/04/2001 Document Revised: 03/22/2016 Document Reviewed: 03/22/2016 Elsevier Interactive Patient Education  2017 Elsevier   Inc.  

## 2017-02-09 ENCOUNTER — Encounter: Payer: Self-pay | Admitting: Obstetrics and Gynecology

## 2017-02-19 ENCOUNTER — Other Ambulatory Visit: Payer: Self-pay | Admitting: Obstetrics and Gynecology

## 2017-02-19 DIAGNOSIS — O3680X0 Pregnancy with inconclusive fetal viability, not applicable or unspecified: Secondary | ICD-10-CM

## 2017-02-21 ENCOUNTER — Ambulatory Visit (INDEPENDENT_AMBULATORY_CARE_PROVIDER_SITE_OTHER): Payer: BLUE CROSS/BLUE SHIELD | Admitting: Obstetrics and Gynecology

## 2017-02-21 ENCOUNTER — Encounter: Payer: Self-pay | Admitting: Obstetrics and Gynecology

## 2017-02-21 ENCOUNTER — Ambulatory Visit (INDEPENDENT_AMBULATORY_CARE_PROVIDER_SITE_OTHER): Payer: BLUE CROSS/BLUE SHIELD

## 2017-02-21 VITALS — BP 111/81 | HR 97 | Wt 202.7 lb

## 2017-02-21 DIAGNOSIS — O3680X0 Pregnancy with inconclusive fetal viability, not applicable or unspecified: Secondary | ICD-10-CM | POA: Diagnosis not present

## 2017-02-21 DIAGNOSIS — O209 Hemorrhage in early pregnancy, unspecified: Secondary | ICD-10-CM | POA: Diagnosis not present

## 2017-02-21 NOTE — Progress Notes (Signed)
Subjective:     Patient ID: Angela PallJennifer Knox, female   DOB: 1980/07/12, 36 y.o.   MRN: [id number]  HPI Here for spotting today, is currently 4580w1d pregnant and has had no complications thus far.  No sex recently, and denies urinary s/s. Mild cramping yesterday. Dark red blood in underware noted yesterday afternoon, and then bright red blood noted in urine this morning. Not enough to soak a pad.   Review of Systems Negative except stated above in HPI.    Objective:   Physical Exam A&Ox4 Well groomed female Blood pressure 111/81, pulse 97, weight 202 lb 11.2 oz (91.9 kg), last menstrual period 01/02/2017. Urinalysis Negative except for blood Pelvic exam: normal external genitalia, vulva, vagina, cervix, uterus and adnexa, VAGINA: normal appearing vagina with normal color and discharge, no lesions, vaginal discharge - dark and bloody, CERVIX: cervical discharge present - dark and bloody.    pelvic ultrasound reveals: No visualized products of conception.  Uterus measures: 6.8 x 3.3 x 3.5cm. Uterus appears homogeneous without evidence of focal masses. The Endometrium measures: 3.614mm.  Right Ovary measures 2.6 x 1.6 x 1.8 cm and appears WNL.  Left Ovary measures 2.5 x 1.6 x 2.1 cm and appears WNL.  There is no obvious evidence of a corpus luteal cyst. Survey of the adnexa demonstrates no adnexal masses. There is no free peritoneal fluid in the cul de sac.  Impression: 1. Non-viable pregnancy. 2. No visualized products of conception.  Assessment:     First trimester spotting miscariage    Plan:     Labs obtained along with pelvic ultrasound. Will follow up in one week or as needed.   Davis Vannatter,CNM

## 2017-02-21 NOTE — Patient Instructions (Signed)

## 2017-02-22 ENCOUNTER — Other Ambulatory Visit: Payer: BLUE CROSS/BLUE SHIELD

## 2017-02-22 LAB — BETA HCG QUANT (REF LAB): hCG Quant: <1 m[IU]/mL

## 2017-02-22 LAB — PROGESTERONE: PROGESTERONE: 0.2 ng/mL

## 2017-02-27 ENCOUNTER — Encounter: Payer: Self-pay | Admitting: Obstetrics and Gynecology

## 2017-02-28 ENCOUNTER — Encounter: Payer: Self-pay | Admitting: Obstetrics and Gynecology

## 2017-03-01 ENCOUNTER — Encounter: Payer: Self-pay | Admitting: Obstetrics and Gynecology

## 2017-03-01 ENCOUNTER — Ambulatory Visit (INDEPENDENT_AMBULATORY_CARE_PROVIDER_SITE_OTHER): Payer: BLUE CROSS/BLUE SHIELD | Admitting: Obstetrics and Gynecology

## 2017-03-01 VITALS — BP 116/79 | HR 103 | Ht 65.0 in | Wt 204.4 lb

## 2017-03-01 DIAGNOSIS — E669 Obesity, unspecified: Secondary | ICD-10-CM

## 2017-03-01 DIAGNOSIS — Z8759 Personal history of other complications of pregnancy, childbirth and the puerperium: Secondary | ICD-10-CM

## 2017-03-01 MED ORDER — CONCEPT DHA 53.5-38-1 MG PO CAPS: 1.0000 | ORAL_CAPSULE | Freq: Every day | ORAL | 12 refills | Status: DC

## 2017-03-01 MED ORDER — CYANOCOBALAMIN 1000 MCG/ML IJ SOLN: 1000.0000 ug | INTRAMUSCULAR | 1 refills | Status: DC

## 2017-03-01 MED ORDER — PHENTERMINE HCL 37.5 MG PO TABS: 37.5000 mg | ORAL_TABLET | Freq: Every day | ORAL | 2 refills | Status: DC

## 2017-03-01 NOTE — Progress Notes (Signed)
Subjective:     Patient ID: Angela Knox, female   DOB: Dec 03, 1980, 36 y.o.   MRN: [id number]  HPI  Here for miscarriage follow up. States had light bleeding for 4 days with lower back cramping. Sleeping ok but wanting to eat everything in sight as she tends to eat from emotions.    Desires restarting weight loss medications.and desires another pregnancy early next year.  Review of Systems Negative except stated above in HPI    Objective:   Physical Exam A&Ox4 Well groomed female in no distress Blood pressure 116/79, pulse (!) 103, height 5\' 5"  (1.651 m), weight 204 lb 6.4 oz (92.7 kg), last menstrual period 02/20/2017, unknown if currently breastfeeding. Body mass index is 34.01 kg/m.  PE not indicated.    Assessment:     S/p miscarriage obesity    Plan:     Counseled on waiting 2-3 months before trying again, to continue PNV- rx sent in for [facility] DHA. Restarted on weight loss medications- B12 injection given today, RTC in 4 weeks for wt/BP/B12 and as needed.   Kaytlynn Kochan,CNM

## 2017-03-04 ENCOUNTER — Telehealth: Payer: BC Managed Care – PPO | Admitting: Family

## 2017-03-04 DIAGNOSIS — R399 Unspecified symptoms and signs involving the genitourinary system: Secondary | ICD-10-CM

## 2017-03-04 MED ORDER — NITROFURANTOIN MONOHYD MACRO 100 MG PO CAPS: 100.0000 mg | ORAL_CAPSULE | Freq: Two times a day (BID) | ORAL | 0 refills | Status: DC

## 2017-03-04 NOTE — Progress Notes (Signed)

## 2017-03-07 ENCOUNTER — Encounter: Payer: Self-pay | Admitting: Obstetrics and Gynecology

## 2017-03-08 ENCOUNTER — Other Ambulatory Visit: Payer: Self-pay | Admitting: *Deleted

## 2017-03-08 ENCOUNTER — Other Ambulatory Visit: Payer: Self-pay | Admitting: Obstetrics and Gynecology

## 2017-03-08 DIAGNOSIS — R300 Dysuria: Secondary | ICD-10-CM

## 2017-03-08 MED ORDER — PHENAZOPYRIDINE HCL 200 MG PO TABS: 200.0000 mg | ORAL_TABLET | Freq: Three times a day (TID) | ORAL | 0 refills | Status: DC | PRN

## 2017-03-09 ENCOUNTER — Other Ambulatory Visit: Payer: Self-pay

## 2017-03-09 DIAGNOSIS — R300 Dysuria: Secondary | ICD-10-CM

## 2017-03-11 LAB — URINE CULTURE: ORGANISM ID, BACTERIA: NO GROWTH

## 2017-03-12 ENCOUNTER — Encounter: Payer: Self-pay | Admitting: Obstetrics and Gynecology

## 2017-03-13 ENCOUNTER — Other Ambulatory Visit: Payer: BLUE CROSS/BLUE SHIELD

## 2017-03-13 DIAGNOSIS — R739 Hyperglycemia, unspecified: Secondary | ICD-10-CM

## 2017-03-14 LAB — HEMOGLOBIN A1C
ESTIMATED AVERAGE GLUCOSE: 108 mg/dL
HEMOGLOBIN A1C: 5.4 % (ref 4.8–5.6)

## 2017-03-20 ENCOUNTER — Encounter: Payer: Self-pay | Admitting: Medical

## 2017-03-20 ENCOUNTER — Ambulatory Visit: Payer: Self-pay | Admitting: Medical

## 2017-03-20 VITALS — BP 118/84 | HR 84 | Temp 98.1°F | Ht 65.0 in | Wt 204.8 lb

## 2017-03-20 DIAGNOSIS — J0110 Acute frontal sinusitis, unspecified: Secondary | ICD-10-CM

## 2017-03-20 MED ORDER — AMOXICILLIN-POT CLAVULANATE 875-125 MG PO TABS: 1.0000 | ORAL_TABLET | Freq: Two times a day (BID) | ORAL | 0 refills | Status: DC

## 2017-03-20 NOTE — Progress Notes (Signed)
   Subjective:    Patient ID: Angela PallJennifer Knox, female    DOB: 02-22-1981, 36 y.o.   MRN: [id number]  HPI  36 yo female non acute distress started about one week ago nasal congestion , discharge green and now feels it is in her chest. Dry cough.sore throat x  5 days  , mild. Denies fever or chills.  Some headache forehead and teeth pain upper right.   Miscarrage in September. Trying again to get pregnant.  Review of Systems  Constitutional: Positive for fatigue. Negative for chills and fever.  HENT: Positive for congestion, postnasal drip, rhinorrhea, sinus pressure, sinus pain, sneezing and sore throat. Negative for ear pain.   Eyes: Negative for discharge and itching.  Respiratory: Positive for cough. Negative for chest tightness and shortness of breath.   Cardiovascular: Negative for chest pain, palpitations and leg swelling.  Gastrointestinal: Negative for abdominal pain.  Endocrine: Negative for cold intolerance and heat intolerance.  Genitourinary: Negative for dysuria.  Musculoskeletal: Negative for myalgias.  Skin: Negative for rash.  Allergic/Immunologic: Positive for environmental allergies. Negative for food allergies.  Neurological: Positive for light-headedness (" mild here and there") and headaches. Negative for dizziness.  Hematological: Negative for adenopathy.  Psychiatric/Behavioral: Negative for behavioral problems, self-injury and suicidal ideas. The patient is not nervous/anxious.        Objective:   Physical Exam  Constitutional: She is oriented to person, place, and time. She appears well-developed and well-nourished.  HENT:  Head: Normocephalic and atraumatic.  Right Ear: Hearing, external ear and ear canal normal. A middle ear effusion is present.  Left Ear: Hearing, external ear and ear canal normal. A middle ear effusion is present.  Nose: Mucosal edema and rhinorrhea present.  Mouth/Throat: Uvula is midline, oropharynx is clear and moist and mucous  membranes are normal.  Eyes: Conjunctivae and EOM are normal. Pupils are equal, round, and reactive to light.  Neck: Normal range of motion. Neck supple.  Cardiovascular: Normal rate, regular rhythm and normal heart sounds.  Pulmonary/Chest: Effort normal and breath sounds normal.  Lymphadenopathy:    She has no cervical adenopathy.  Neurological: She is alert and oriented to person, place, and time.  Skin: Skin is warm and dry.  Psychiatric: She has a normal mood and affect. Her behavior is normal. Judgment and thought content normal.  Nursing note and vitals reviewed.         Assessment & Plan:  Sinusitis Meds ordered this encounter  Medications  . amoxicillin-clavulanate (AUGMENTIN) 875-125 MG tablet    Sig: Take 1 tablet by mouth 2 (two) times daily.    Dispense:  10 tablet    Refill:  0  OTC Flonase  Take as directed . Return 3-5 days if not improving . Patient verbalizes understanding and has no questions at discharge.

## 2017-03-20 NOTE — Patient Instructions (Signed)

## 2017-03-23 ENCOUNTER — Encounter: Payer: BLUE CROSS/BLUE SHIELD | Admitting: Obstetrics and Gynecology

## 2017-03-25 ENCOUNTER — Other Ambulatory Visit: Payer: Self-pay | Admitting: Obstetrics and Gynecology

## 2017-03-30 ENCOUNTER — Ambulatory Visit: Payer: BLUE CROSS/BLUE SHIELD | Admitting: Obstetrics and Gynecology

## 2017-04-10 ENCOUNTER — Other Ambulatory Visit: Payer: Self-pay | Admitting: Obstetrics and Gynecology

## 2017-04-15 ENCOUNTER — Other Ambulatory Visit: Payer: Self-pay | Admitting: Obstetrics and Gynecology

## 2017-04-16 ENCOUNTER — Encounter: Payer: Self-pay | Admitting: Obstetrics and Gynecology

## 2017-04-23 ENCOUNTER — Other Ambulatory Visit: Payer: Self-pay

## 2017-04-23 ENCOUNTER — Other Ambulatory Visit: Payer: BLUE CROSS/BLUE SHIELD

## 2017-04-23 DIAGNOSIS — R739 Hyperglycemia, unspecified: Secondary | ICD-10-CM

## 2017-04-23 DIAGNOSIS — N926 Irregular menstruation, unspecified: Secondary | ICD-10-CM

## 2017-04-24 LAB — COMPREHENSIVE METABOLIC PANEL
A/G RATIO: 1.8 (ref 1.2–2.2)
ALBUMIN: 4.3 g/dL (ref 3.5–5.5)
ALT: 20 IU/L (ref 0–32)
AST: 21 IU/L (ref 0–40)
Alkaline Phosphatase: 96 IU/L (ref 39–117)
BUN / CREAT RATIO: 9 (ref 9–23)
BUN: 7 mg/dL (ref 6–20)
Bilirubin Total: 0.3 mg/dL (ref 0.0–1.2)
CALCIUM: 9 mg/dL (ref 8.7–10.2)
CO2: 20 mmol/L (ref 20–29)
CREATININE: 0.77 mg/dL (ref 0.57–1.00)
Chloride: 103 mmol/L (ref 96–106)
GFR calc Af Amer: 117 mL/min/{1.73_m2} (ref >59)
GFR, EST NON AFRICAN AMERICAN: 101 mL/min/{1.73_m2} (ref >59)
GLOBULIN, TOTAL: 2.4 g/dL (ref 1.5–4.5)
Glucose: 90 mg/dL (ref 65–99)
POTASSIUM: 4.4 mmol/L (ref 3.5–5.2)
SODIUM: 139 mmol/L (ref 134–144)
Total Protein: 6.7 g/dL (ref 6.0–8.5)

## 2017-04-24 LAB — PROGESTERONE: Progesterone: 18.2 ng/mL

## 2017-04-24 LAB — BETA HCG QUANT (REF LAB): hCG Quant: 1315 m[IU]/mL

## 2017-04-24 NOTE — L&D Delivery Note (Signed)
Delivery Note   Angela Knox is a 38 y.o. G2P0010 at [redacted]w[redacted]d Estimated Date of Delivery: 12/26/17  PRE-OPERATIVE DIAGNOSIS:  1) [redacted]w[redacted]d pregnancy. Gestational Hypertension  POST-OPERATIVE DIAGNOSIS:  1) [redacted]w[redacted]d pregnancy s/p Vaginal, Spontaneous   Delivery Type: Vaginal, Spontaneous    Delivery Anesthesia: Epidural   Labor Complications:   None    ESTIMATED BLOOD LOSS: 1,076 ml    FINDINGS:   1) female infant, Apgar scores of 7  at 1 minute and 9  at 5 minutes and a birthweight of 7lbs 14oz.    2) Nuchal cord: no  SPECIMENS:   PLACENTA:   Appearance: Intact , 3 vessel cord, cord blood sample collected   Removal: Spontaneous      Disposition:   held per protocol then discarded   DISPOSITION:  Infant to left in stable condition in the delivery room, with L&D personnel and mother,  NARRATIVE SUMMARY: Labor course:  Ms. [name] is a G2P0010 at [redacted]w[redacted]d who presented for induction of labor.  She progressed well in labor with pitocin.  She received the appropriate anesthesia and proceeded to complete dilation. She evidenced good maternal expulsive effort during the second stage. She went on to deliver a viable female infant "Emilie". The head delivered in OA and rotated to LOT. The shoulders delivered with ease. The placenta delivered without problems and was noted to be complete. A perineal and vaginal examination was performed. Episiotomy/Lacerations:   2nd degree perineal and clitoral . A red rubber catheter was placed for repair of clitoral laceration. Dr. Valentino Saxon consulted on clitoral repair.  Episiotomy or lacerations were repaired with 3-0 Vicryl suture using local anesthesia. The patient tolerated this well.  [name], CNM  12/23/2017 9:54 PM

## 2017-04-25 ENCOUNTER — Other Ambulatory Visit: Payer: BLUE CROSS/BLUE SHIELD

## 2017-04-25 DIAGNOSIS — N926 Irregular menstruation, unspecified: Secondary | ICD-10-CM

## 2017-04-26 ENCOUNTER — Encounter: Payer: Self-pay | Admitting: Obstetrics and Gynecology

## 2017-04-26 ENCOUNTER — Ambulatory Visit (INDEPENDENT_AMBULATORY_CARE_PROVIDER_SITE_OTHER): Payer: BLUE CROSS/BLUE SHIELD | Admitting: Obstetrics and Gynecology

## 2017-04-26 VITALS — BP 108/79 | HR 93 | Wt 207.2 lb

## 2017-04-26 DIAGNOSIS — N926 Irregular menstruation, unspecified: Secondary | ICD-10-CM

## 2017-04-26 LAB — PROGESTERONE: Progesterone: 17.4 ng/mL

## 2017-04-26 LAB — ABO AND RH: Rh Factor: POSITIVE

## 2017-04-26 LAB — BETA HCG QUANT (REF LAB): hCG Quant: 2653 m[IU]/mL

## 2017-04-26 NOTE — Progress Notes (Signed)
OB sick visit- head and chest congestion, coughing productive, x 4 days

## 2017-04-26 NOTE — Progress Notes (Signed)
Problem OB visit- here to discuss labs and also reports head congestion and nasal drainage with low grade fever x 4 days. Non-productive cough. All drainage clear. All normal on exam. Reassured. Will do viability scan in 1 week and repeat quant at that time.

## 2017-04-26 NOTE — Patient Instructions (Signed)
First Trimester of Pregnancy The first trimester of pregnancy is from week 1 until the end of week 13 (months 1 through 3). During this time, your baby will begin to develop inside you. At 6-8 weeks, the eyes and face are formed, and the heartbeat can be seen on ultrasound. At the end of 12 weeks, all the baby's organs are formed. Prenatal care is all the medical care you receive before the birth of your baby. Make sure you get good prenatal care and follow all of your doctor's instructions. Follow these instructions at home: Medicines  Take over-the-counter and prescription medicines only as told by your doctor. Some medicines are safe and some medicines are not safe during pregnancy.  Take a prenatal vitamin that contains at least 600 micrograms (mcg) of folic acid.  If you have trouble pooping (constipation), take medicine that will make your stool soft (stool softener) if your doctor approves. Eating and drinking  Eat regular, healthy meals.  Your doctor will tell you the amount of weight gain that is right for you.  Avoid raw meat and uncooked cheese.  If you feel sick to your stomach (nauseous) or throw up (vomit): ? Eat 4 or 5 small meals a day instead of 3 large meals. ? Try eating a few soda crackers. ? Drink liquids between meals instead of during meals.  To prevent constipation: ? Eat foods that are high in fiber, like fresh fruits and vegetables, whole grains, and beans. ? Drink enough fluids to keep your pee (urine) clear or pale yellow. Activity  Exercise only as told by your doctor. Stop exercising if you have cramps or pain in your lower belly (abdomen) or low back.  Do not exercise if it is too hot, too humid, or if you are in a place of great height (high altitude).  Try to avoid standing for long periods of time. Move your legs often if you must stand in one place for a long time.  Avoid heavy lifting.  Wear low-heeled shoes. Sit and stand up straight.  You  can have sex unless your doctor tells you not to. Relieving pain and discomfort  Wear a good support bra if your breasts are sore.  Take warm water baths (sitz baths) to soothe pain or discomfort caused by hemorrhoids. Use hemorrhoid cream if your doctor says it is okay.  Rest with your legs raised if you have leg cramps or low back pain.  If you have puffy, bulging veins (varicose veins) in your legs: ? Wear support hose or compression stockings as told by your doctor. ? Raise (elevate) your feet for 15 minutes, 3-4 times a day. ? Limit salt in your food. Prenatal care  Schedule your prenatal visits by the twelfth week of pregnancy.  Write down your questions. Take them to your prenatal visits.  Keep all your prenatal visits as told by your doctor. This is important. Safety  Wear your seat belt at all times when driving.  Make a list of emergency phone numbers. The list should include numbers for family, friends, the hospital, and police and fire departments. General instructions  Ask your doctor for a referral to a local prenatal class. Begin classes no later than at the start of month 6 of your pregnancy.  Ask for help if you need counseling or if you need help with nutrition. Your doctor can give you advice or tell you where to go for help.  Do not use hot tubs, steam rooms, or   saunas.  Do not douche or use tampons or scented sanitary pads.  Do not cross your legs for long periods of time.  Avoid all herbs and alcohol. Avoid drugs that are not approved by your doctor.  Do not use any tobacco products, including cigarettes, chewing tobacco, and electronic cigarettes. If you need help quitting, ask your doctor. You may get counseling or other support to help you quit.  Avoid cat litter boxes and soil used by cats. These carry germs that can cause birth defects in the baby and can cause a loss of your baby (miscarriage) or stillbirth.  Visit your dentist. At home, brush  your teeth with a soft toothbrush. Be gentle when you floss. Contact a doctor if:  You are dizzy.  You have mild cramps or pressure in your lower belly.  You have a nagging pain in your belly area.  You continue to feel sick to your stomach, you throw up, or you have watery poop (diarrhea).  You have a bad smelling fluid coming from your vagina.  You have pain when you pee (urinate).  You have increased puffiness (swelling) in your face, hands, legs, or ankles. Get help right away if:  You have a fever.  You are leaking fluid from your vagina.  You have spotting or bleeding from your vagina.  You have very bad belly cramping or pain.  You gain or lose weight rapidly.  You throw up blood. It may look like coffee grounds.  You are around people who have German measles, fifth disease, or chickenpox.  You have a very bad headache.  You have shortness of breath.  You have any kind of trauma, such as from a fall or a car accident. Summary  The first trimester of pregnancy is from week 1 until the end of week 13 (months 1 through 3).  To take care of yourself and your unborn baby, you will need to eat healthy meals, take medicines only if your doctor tells you to do so, and do activities that are safe for you and your baby.  Keep all follow-up visits as told by your doctor. This is important as your doctor will have to ensure that your baby is healthy and growing well. This information is not intended to replace advice given to you by your health care provider. Make sure you discuss any questions you have with your health care provider. Document Released: 09/27/2007 Document Revised: 04/18/2016 Document Reviewed: 04/18/2016 [employer] Interactive Patient Education  2017 [employer]  

## 2017-04-27 ENCOUNTER — Encounter: Payer: Self-pay | Admitting: Obstetrics and Gynecology

## 2017-04-30 ENCOUNTER — Encounter: Payer: Self-pay | Admitting: Obstetrics and Gynecology

## 2017-05-04 ENCOUNTER — Ambulatory Visit (INDEPENDENT_AMBULATORY_CARE_PROVIDER_SITE_OTHER): Payer: BLUE CROSS/BLUE SHIELD

## 2017-05-04 ENCOUNTER — Ambulatory Visit: Payer: BLUE CROSS/BLUE SHIELD

## 2017-05-04 DIAGNOSIS — N926 Irregular menstruation, unspecified: Secondary | ICD-10-CM

## 2017-05-05 LAB — BETA HCG QUANT (REF LAB): hCG Quant: 29432 m[IU]/mL

## 2017-05-07 ENCOUNTER — Encounter: Payer: Self-pay | Admitting: Obstetrics and Gynecology

## 2017-05-08 ENCOUNTER — Other Ambulatory Visit: Payer: Self-pay | Admitting: *Deleted

## 2017-05-08 MED ORDER — DOXYLAMINE-PYRIDOXINE 10-10 MG PO TBEC: 1.0000 | DELAYED_RELEASE_TABLET | Freq: Two times a day (BID) | ORAL | 3 refills | Status: DC

## 2017-05-15 ENCOUNTER — Encounter: Payer: BLUE CROSS/BLUE SHIELD | Admitting: Obstetrics and Gynecology

## 2017-05-15 ENCOUNTER — Encounter: Payer: Self-pay | Admitting: Obstetrics and Gynecology

## 2017-05-16 ENCOUNTER — Other Ambulatory Visit: Payer: BLUE CROSS/BLUE SHIELD

## 2017-05-16 DIAGNOSIS — Z8759 Personal history of other complications of pregnancy, childbirth and the puerperium: Secondary | ICD-10-CM

## 2017-05-16 LAB — BETA HCG QUANT (REF LAB): hCG Quant: 92863 m[IU]/mL

## 2017-05-21 ENCOUNTER — Encounter: Payer: Self-pay | Admitting: Obstetrics and Gynecology

## 2017-05-22 ENCOUNTER — Other Ambulatory Visit: Payer: Self-pay | Admitting: *Deleted

## 2017-05-22 ENCOUNTER — Encounter: Payer: Self-pay | Admitting: *Deleted

## 2017-05-22 MED ORDER — METRONIDAZOLE 500 MG PO TABS: 500.0000 mg | ORAL_TABLET | Freq: Two times a day (BID) | ORAL | 0 refills | Status: DC

## 2017-05-25 ENCOUNTER — Other Ambulatory Visit: Payer: Self-pay | Admitting: *Deleted

## 2017-05-25 ENCOUNTER — Ambulatory Visit (INDEPENDENT_AMBULATORY_CARE_PROVIDER_SITE_OTHER): Payer: BLUE CROSS/BLUE SHIELD | Admitting: Certified Nurse Midwife

## 2017-05-25 ENCOUNTER — Ambulatory Visit (INDEPENDENT_AMBULATORY_CARE_PROVIDER_SITE_OTHER): Payer: BLUE CROSS/BLUE SHIELD

## 2017-05-25 ENCOUNTER — Other Ambulatory Visit: Payer: Self-pay | Admitting: Certified Nurse Midwife

## 2017-05-25 ENCOUNTER — Telehealth: Payer: Self-pay | Admitting: *Deleted

## 2017-05-25 VITALS — BP 114/85 | HR 103 | Wt 208.1 lb

## 2017-05-25 DIAGNOSIS — N926 Irregular menstruation, unspecified: Secondary | ICD-10-CM

## 2017-05-25 DIAGNOSIS — Z3401 Encounter for supervision of normal first pregnancy, first trimester: Secondary | ICD-10-CM

## 2017-05-25 MED ORDER — METRONIDAZOLE 0.75 % VA GEL: 1.0000 | Freq: Two times a day (BID) | VAGINAL | 0 refills | Status: DC

## 2017-05-25 MED ORDER — METRONIDAZOLE 1 % EX GEL: Freq: Every day | CUTANEOUS | 0 refills | Status: DC

## 2017-05-25 NOTE — Progress Notes (Signed)
Angela Knox presents for NOB nurse interview visit. Pregnancy confirmation done _1/11/19_____.  G-2 .  P-0    . Pregnancy education material explained and given. _2__ cats in the home. NOB labs ordered. (TSH/HbgA1c due to Increased BMI), HIV labs and Drug screen were explained optional and she did not decline. Drug screen ordered. PNV encouraged. Genetic screening options discussed. Genetic testing: Ordered.  Pt may discuss with provider. Pt. To follow up with provider in _3_ weeks for NOB physical.  All questions answered.

## 2017-05-25 NOTE — Addendum Note (Signed)
Addended by: Brooke DareSICK, Cera Rorke L on: 05/25/2017 02:49 PM   Modules accepted: Orders

## 2017-05-25 NOTE — Telephone Encounter (Signed)
Called pharmacy

## 2017-05-25 NOTE — Telephone Encounter (Signed)
Pharmacist in [facility] called and stated she needed clarification on the medication Metrogel that was sent over for the patient. The pharmacy # is [phone]. Please advise Thank you

## 2017-05-26 LAB — HEMOGLOBIN A1C
ESTIMATED AVERAGE GLUCOSE: 103 mg/dL
Hgb A1c MFr Bld: 5.2 % (ref 4.8–5.6)

## 2017-05-26 LAB — URINALYSIS, ROUTINE W REFLEX MICROSCOPIC
BILIRUBIN UA: NEGATIVE
Glucose, UA: NEGATIVE
Ketones, UA: NEGATIVE
Nitrite, UA: NEGATIVE
PH UA: 5.5 (ref 5.0–7.5)
PROTEIN UA: NEGATIVE
RBC UA: NEGATIVE
Specific Gravity, UA: 1.023 (ref 1.005–1.030)
UUROB: 0.2 mg/dL (ref 0.2–1.0)

## 2017-05-26 LAB — CBC WITH DIFFERENTIAL
BASOS: 0 %
Basophils Absolute: 0 10*3/uL (ref 0.0–0.2)
EOS (ABSOLUTE): 0.1 10*3/uL (ref 0.0–0.4)
Eos: 1 %
Hematocrit: 38.2 % (ref 34.0–46.6)
Hemoglobin: 12.5 g/dL (ref 11.1–15.9)
Immature Grans (Abs): 0 10*3/uL (ref 0.0–0.1)
Immature Granulocytes: 0 %
LYMPHS ABS: 1.3 10*3/uL (ref 0.7–3.1)
Lymphs: 15 %
MCH: 28.5 pg (ref 26.6–33.0)
MCHC: 32.7 g/dL (ref 31.5–35.7)
MCV: 87 fL (ref 79–97)
MONOS ABS: 0.5 10*3/uL (ref 0.1–0.9)
Monocytes: 6 %
Neutrophils Absolute: 7.1 10*3/uL — ABNORMAL HIGH (ref 1.4–7.0)
Neutrophils: 78 %
RBC: 4.38 x10E6/uL (ref 3.77–5.28)
RDW: 13.8 % (ref 12.3–15.4)
WBC: 9 10*3/uL (ref 3.4–10.8)

## 2017-05-26 LAB — RUBELLA SCREEN: Rubella Antibodies, IGG: 6.13 index (ref >0.99)

## 2017-05-26 LAB — HEPATITIS B SURFACE ANTIGEN: Hepatitis B Surface Ag: NEGATIVE

## 2017-05-26 LAB — MICROSCOPIC EXAMINATION
CASTS: NONE SEEN /LPF
Epithelial Cells (non renal): >10 /hpf — AB (ref 0–10)

## 2017-05-26 LAB — TSH: TSH: 0.336 u[IU]/mL — ABNORMAL LOW (ref 0.450–4.500)

## 2017-05-26 LAB — RPR: RPR Ser Ql: NONREACTIVE

## 2017-05-26 LAB — VARICELLA ZOSTER ANTIBODY, IGG: VARICELLA: 1849 {index} (ref >165)

## 2017-05-26 LAB — ANTIBODY SCREEN: ANTIBODY SCREEN: NEGATIVE

## 2017-05-26 LAB — HIV ANTIBODY (ROUTINE TESTING W REFLEX): HIV Screen 4th Generation wRfx: NONREACTIVE

## 2017-05-27 LAB — GC/CHLAMYDIA PROBE AMP
CHLAMYDIA, DNA PROBE: NEGATIVE
Neisseria gonorrhoeae by PCR: NEGATIVE

## 2017-05-27 LAB — URINE CULTURE

## 2017-05-28 LAB — MONITOR DRUG PROFILE 14(MW)
Amphetamine Scrn, Ur: NEGATIVE ng/mL
BARBITURATE SCREEN URINE: NEGATIVE ng/mL
BENZODIAZEPINE SCREEN, URINE: NEGATIVE ng/mL
BUPRENORPHINE, URINE: NEGATIVE ng/mL
CANNABINOIDS UR QL SCN: NEGATIVE ng/mL
CREATININE(CRT), U: 222.9 mg/dL (ref 20.0–300.0)
Cocaine (Metab) Scrn, Ur: NEGATIVE ng/mL
Fentanyl, Urine: NEGATIVE pg/mL
MEPERIDINE SCREEN, URINE: NEGATIVE ng/mL
METHADONE SCREEN, URINE: NEGATIVE ng/mL
OXYCODONE+OXYMORPHONE UR QL SCN: NEGATIVE ng/mL
Opiate Scrn, Ur: NEGATIVE ng/mL
PH UR, DRUG SCRN: 5.7 (ref 4.5–8.9)
PHENCYCLIDINE QUANTITATIVE URINE: NEGATIVE ng/mL
PROPOXYPHENE SCREEN URINE: NEGATIVE ng/mL
SPECIFIC GRAVITY: 1.019
Tramadol Screen, Urine: NEGATIVE ng/mL

## 2017-05-28 LAB — NICOTINE SCREEN, URINE: Cotinine Ql Scrn, Ur: NEGATIVE ng/mL

## 2017-05-30 ENCOUNTER — Encounter: Payer: Self-pay | Admitting: Certified Nurse Midwife

## 2017-05-30 ENCOUNTER — Ambulatory Visit (INDEPENDENT_AMBULATORY_CARE_PROVIDER_SITE_OTHER): Payer: BLUE CROSS/BLUE SHIELD | Admitting: Certified Nurse Midwife

## 2017-05-30 VITALS — BP 99/76 | HR 105 | Wt 207.4 lb

## 2017-05-30 DIAGNOSIS — Z3401 Encounter for supervision of normal first pregnancy, first trimester: Secondary | ICD-10-CM | POA: Diagnosis not present

## 2017-05-30 LAB — POCT URINALYSIS DIPSTICK
Bilirubin, UA: NEGATIVE
GLUCOSE UA: NEGATIVE
LEUKOCYTES UA: NEGATIVE
NITRITE UA: NEGATIVE
PROTEIN UA: NEGATIVE
RBC UA: NEGATIVE
Urobilinogen, UA: 0.2 E.U./dL
pH, UA: 5 (ref 5.0–8.0)

## 2017-05-30 NOTE — Progress Notes (Signed)
Pt is here with c/o "leaking" fluid x1. No cramping or bleeding.

## 2017-05-30 NOTE — Progress Notes (Signed)
Pt complains of increased vaginal discharge that occurred this morning. She denies burning , itching , and odor. She admits to completing vaginal cream medication for BV treatment yesterday. Wet prep negative. Reassurance given. Follow up as scheduled for NOB physical exam in 2 wks.   Doreene BurkeAnnie Kati Riggenbach, CNM

## 2017-05-30 NOTE — Patient Instructions (Signed)

## 2017-06-15 ENCOUNTER — Ambulatory Visit (INDEPENDENT_AMBULATORY_CARE_PROVIDER_SITE_OTHER): Payer: BLUE CROSS/BLUE SHIELD | Admitting: Certified Nurse Midwife

## 2017-06-15 ENCOUNTER — Encounter: Payer: Self-pay | Admitting: Certified Nurse Midwife

## 2017-06-15 VITALS — BP 124/71 | HR 106 | Wt 209.8 lb

## 2017-06-15 DIAGNOSIS — Z3481 Encounter for supervision of other normal pregnancy, first trimester: Secondary | ICD-10-CM | POA: Diagnosis not present

## 2017-06-15 LAB — POCT URINALYSIS DIPSTICK
Bilirubin, UA: NEGATIVE
Glucose, UA: NEGATIVE
LEUKOCYTES UA: NEGATIVE
NITRITE UA: NEGATIVE
Odor: NEGATIVE
PH UA: 5 (ref 5.0–8.0)
Spec Grav, UA: >=1.03 — AB (ref 1.010–1.025)
UROBILINOGEN UA: 0.2 U/dL

## 2017-06-15 NOTE — Progress Notes (Signed)
ROB- no complaints.  

## 2017-06-15 NOTE — Patient Instructions (Addendum)
Back Pain in Pregnancy Back pain during pregnancy is common. Back pain may be caused by several factors that are related to changes during your pregnancy. Follow these instructions at home: Managing pain, stiffness, and swelling  If directed, apply ice for sudden (acute) back pain. ? Put ice in a plastic bag. ? Place a towel between your skin and the bag. ? Leave the ice on for 20 minutes, 2-3 times per day.  If directed, apply heat to the affected area before you exercise: ? Place a towel between your skin and the heat pack or heating pad. ? Leave the heat on for 20-30 minutes. ? Remove the heat if your skin turns bright red. This is especially important if you are unable to feel pain, heat, or cold. You may have a greater risk of getting burned. Activity  Exercise as told by your health care provider. Exercising is the best way to prevent or manage back pain.  Listen to your body when lifting. If lifting hurts, ask for help or bend your knees. This uses your leg muscles instead of your back muscles.  Squat down when picking up something from the floor. Do not bend over.  Only use bed rest as told by your health care provider. Bed rest should only be used for the most severe episodes of back pain. Standing, Sitting, and Lying Down  Do not stand in one place for long periods of time.  Use good posture when sitting. Make sure your head rests over your shoulders and is not hanging forward. Use a pillow on your lower back if necessary.  Try sleeping on your side, preferably the left side, with a pillow or two between your legs. If you are sore after a night's rest, your bed may be too soft. A firm mattress may provide more support for your back during pregnancy. General instructions  Do not wear high heels.  Eat a healthy diet. Try to gain weight within your health care provider's recommendations.  Use a maternity girdle, elastic sling, or back brace as told by your health care  provider.  Take over-the-counter and prescription medicines only as told by your health care provider.  Keep all follow-up visits as told by your health care provider. This is important. This includes any visits with any specialists, such as a physical therapist. Contact a health care provider if:  Your back pain interferes with your daily activities.  You have increasing pain in other parts of your body. Get help right away if:  You develop numbness, tingling, weakness, or problems with the use of your arms or legs.  You develop severe back pain that is not controlled with medicine.  You have a sudden change in bowel or bladder control.  You develop shortness of breath, dizziness, or you faint.  You develop nausea, vomiting, or sweating.  You have back pain that is a rhythmic, cramping pain similar to labor pains. Labor pain is usually 1-2 minutes apart, lasts for about 1 minute, and involves a bearing down feeling or pressure in your pelvis.  You have back pain and your water breaks or you have vaginal bleeding.  You have back pain or numbness that travels down your leg.  Your back pain developed after you fell.  You develop pain on one side of your back.  You see blood in your urine.  You develop skin blisters in the area of your back pain. This information is not intended to replace advice given to you  by your health care provider. Make sure you discuss any questions you have with your health care provider. Document Released: 07/19/2005 Document Revised: 09/16/2015 Document Reviewed: 12/23/2014 Elsevier Interactive Patient Education  2018 [employer]. Morning Sickness Morning sickness is when you feel sick to your stomach (nauseous) during pregnancy. You may feel sick to your stomach and throw up (vomit). You may feel sick in the morning, but you can feel this way any time of day. Some women feel very sick to their stomach and cannot stop throwing up (hyperemesis  gravidarum). Follow these instructions at home:  Only take medicines as told by your doctor.  Take multivitamins as told by your doctor. Taking multivitamins before getting pregnant can stop or lessen the harshness of morning sickness.  Eat dry toast or unsalted crackers before getting out of bed.  Eat 5 to 6 small meals a day.  Eat dry and bland foods like rice and baked potatoes.  Do not drink liquids with meals. Drink between meals.  Do not eat greasy, fatty, or spicy foods.  Have someone cook for you if the smell of food causes you to feel sick or throw up.  If you feel sick to your stomach after taking prenatal vitamins, take them at night or with a snack.  Eat protein when you need a snack (nuts, yogurt, cheese).  Eat unsweetened gelatins for dessert.  Wear a bracelet used for sea sickness (acupressure wristband).  Go to a doctor that puts thin needles into certain body points (acupuncture) to improve how you feel.  Do not smoke.  Use a humidifier to keep the air in your house free of odors.  Get lots of fresh air. Contact a doctor if:  You need medicine to feel better.  You feel dizzy or lightheaded.  You are losing weight. Get help right away if:  You feel very sick to your stomach and cannot stop throwing up.  You pass out (faint). This information is not intended to replace advice given to you by your health care provider. Make sure you discuss any questions you have with your health care provider. Document Released: 05/18/2004 Document Revised: 09/16/2015 Document Reviewed: 09/25/2012 Elsevier Interactive Patient Education  2017 [employer]. Common Medications Safe in Pregnancy  Acne:      Constipation:  Benzoyl Peroxide     Colace  Clindamycin      Dulcolax Suppository  Topica Erythromycin     Fibercon  Salicylic Acid      Metamucil         Miralax AVOID:        Senakot   Accutane    Cough:  Retin-A       Cough  Drops  Tetracycline      Phenergan w/ Codeine if Rx  Minocycline      Robitussin (Plain & DM)  Antibiotics:     Crabs/Lice:  Ceclor       RID  Cephalosporins    AVOID:  E-Mycins      Kwell  Keflex  Macrobid/Macrodantin   Diarrhea:  Penicillin      Kao-Pectate  Zithromax      Imodium AD         PUSH FLUIDS AVOID:       Cipro     Fever:  Tetracycline      Tylenol (Regular or Extra  Minocycline       Strength)  Levaquin      Extra Strength-Do not  Exceed 8 tabs/24 hrs Caffeine:        <200mg/day (equiv. To 1 cup of coffee or  approx. 3 12 oz sodas)         Gas: Cold/Hayfever:       Gas-X  Benadryl      Mylicon  Claritin       Phazyme  **Claritin-D        Chlor-Trimeton    Headaches:  Dimetapp      ASA-Free Excedrin  Drixoral-Non-Drowsy     Cold Compress  Mucinex (Guaifenasin)     Tylenol (Regular or Extra  Sudafed/Sudafed-12 Hour     Strength)  **Sudafed PE Pseudoephedrine   Tylenol Cold & Sinus     Vicks Vapor Rub  Zyrtec  **AVOID if Problems With Blood Pressure         Heartburn: Avoid lying down for at least 1 hour after meals  Aciphex      Maalox     Rash:  Milk of Magnesia     Benadryl    Mylanta       1% Hydrocortisone Cream  Pepcid  Pepcid Complete   Sleep Aids:  Prevacid      Ambien   Prilosec       Benadryl  Rolaids       Chamomile Tea  Tums (Limit 4/day)     Unisom  Zantac       Tylenol PM         Warm milk-add vanilla or  Hemorrhoids:       Sugar for taste  Anusol/Anusol H.C.  (RX: Analapram 2.5%)  Sugar Substitutes:  Hydrocortisone OTC     Ok in moderation  Preparation H      Tucks        Vaseline lotion applied to tissue with wiping    Herpes:     Throat:  Acyclovir      Oragel  Famvir  Valtrex     Vaccines:         Flu Shot Leg Cramps:       *Gardasil  Benadryl      Hepatitis A         Hepatitis B Nasal Spray:       Pneumovax  Saline Nasal Spray     Polio Booster         Tetanus Nausea:       Tuberculosis test or  PPD  Vitamin B6 25 mg TID   AVOID:    Dramamine      *Gardasil  Emetrol       Live Poliovirus  Ginger Root 250 mg QID    MMR (measles, mumps &  High Complex Carbs @ Bedtime    rebella)  Sea Bands-Accupressure    Varicella (Chickenpox)  Unisom 1/2 tab TID     *No known complications           If received before Pain:         Known pregnancy;   Darvocet       Resume series after  Lortab        Delivery  Percocet    Yeast:   Tramadol      Femstat  Tylenol 3      Gyne-lotrimin  Ultram       Monistat  Vicodin           MISC:         All Sunscreens             Hair Coloring/highlights          Insect Repellant's          (Including DEET)         Mystic Tans Second Trimester of Pregnancy The second trimester is from week 13 through week 28, month 4 through 6. This is often the time in pregnancy that you feel your best. Often times, morning sickness has lessened or quit. You may have more energy, and you may get hungry more often. Your unborn baby (fetus) is growing rapidly. At the end of the sixth month, he or she is about 9 inches long and weighs about 1 pounds. You will likely feel the baby move (quickening) between 18 and 20 weeks of pregnancy. Follow these instructions at home:  Avoid all smoking, herbs, and alcohol. Avoid drugs not approved by your doctor.  Do not use any tobacco products, including cigarettes, chewing tobacco, and electronic cigarettes. If you need help quitting, ask your doctor. You may get counseling or other support to help you quit.  Only take medicine as told by your doctor. Some medicines are safe and some are not during pregnancy.  Exercise only as told by your doctor. Stop exercising if you start having cramps.  Eat regular, healthy meals.  Wear a good support bra if your breasts are tender.  Do not use hot tubs, steam rooms, or saunas.  Wear your seat belt when driving.  Avoid raw meat, uncooked cheese, and liter boxes and soil used by cats.  Take  your prenatal vitamins.  Take 1500-2000 milligrams of calcium daily starting at the 20th week of pregnancy until you deliver your baby.  Try taking medicine that helps you poop (stool softener) as needed, and if your doctor approves. Eat more fiber by eating fresh fruit, vegetables, and whole grains. Drink enough fluids to keep your pee (urine) clear or pale yellow.  Take warm water baths (sitz baths) to soothe pain or discomfort caused by hemorrhoids. Use hemorrhoid cream if your doctor approves.  If you have puffy, bulging veins (varicose veins), wear support hose. Raise (elevate) your feet for 15 minutes, 3-4 times a day. Limit salt in your diet.  Avoid heavy lifting, wear low heals, and sit up straight.  Rest with your legs raised if you have leg cramps or low back pain.  Visit your dentist if you have not gone during your pregnancy. Use a soft toothbrush to brush your teeth. Be gentle when you floss.  You can have sex (intercourse) unless your doctor tells you not to.  Go to your doctor visits. Get help if:  You feel dizzy.  You have mild cramps or pressure in your lower belly (abdomen).  You have a nagging pain in your belly area.  You continue to feel sick to your stomach (nauseous), throw up (vomit), or have watery poop (diarrhea).  You have bad smelling fluid coming from your vagina.  You have pain with peeing (urination). Get help right away if:  You have a fever.  You are leaking fluid from your vagina.  You have spotting or bleeding from your vagina.  You have severe belly cramping or pain.  You lose or gain weight rapidly.  You have trouble catching your breath and have chest pain.  You notice sudden or extreme puffiness (swelling) of your face, hands, ankles, feet, or legs.  You have not felt the baby move in over an hour.  You have severe headaches that do not go away with  medicine.  You have vision changes. This information is not intended to  replace advice given to you by your health care provider. Make sure you discuss any questions you have with your health care provider. Document Released: 07/05/2009 Document Revised: 09/16/2015 Document Reviewed: 06/11/2012 Elsevier Interactive Patient Education  2017 [employer] WHAT OB PATIENTS CAN EXPECT   Confirmation of pregnancy and ultrasound ordered if medically indicated-[redacted] weeks gestation  New OB (NOB) intake with nurse and New OB (NOB) labs- [redacted] weeks gestation  New OB (NOB) physical examination with provider- 11/[redacted] weeks gestation  Flu vaccine-[redacted] weeks gestation  Anatomy scan-[redacted] weeks gestation  Glucose tolerance test, blood work to test for anemia, T-dap vaccine-[redacted] weeks gestation  Vaginal swabs/cultures-STD/Group B strep-[redacted] weeks gestation  Appointments every 4 weeks until 28 weeks  Every 2 weeks from 28 weeks until 36 weeks  Weekly visits from 36 weeks until delivery   Eating Plan for Pregnant Women While you are pregnant, your body will require additional nutrition to help support your growing baby. It is recommended that you consume:  150 additional calories each day during your first trimester.  300 additional calories each day during your second trimester.  300 additional calories each day during your third trimester.  Eating a healthy, well-balanced diet is very important for your health and for your baby's health. You also have a higher need for some vitamins and minerals, such as folic acid, calcium, iron, and vitamin D. What do I need to know about eating during pregnancy?  Do not try to lose weight or go on a diet during pregnancy.  Choose healthy, nutritious foods. Choose  of a sandwich with a glass of milk instead of a candy bar or a high-calorie sugar-sweetened beverage.  Limit your overall intake of foods that have "empty calories." These are foods that have little nutritional value, such as sweets, desserts, candies, sugar-sweetened beverages,  and fried foods.  Eat a variety of foods, especially fruits and vegetables.  Take a prenatal vitamin to help meet the additional needs during pregnancy, specifically for folic acid, iron, calcium, and vitamin D.  Remember to stay active. Ask your health care provider for exercise recommendations that are specific to you.  Practice good food safety and cleanliness, such as washing your hands before you eat and after you prepare raw meat. This helps to prevent foodborne illnesses, such as listeriosis, that can be very dangerous for your baby. Ask your health care provider for more information about listeriosis. What does 150 extra calories look like? Healthy options for an additional 150 calories each day could be any of the following:  Plain low-fat yogurt (6-8 oz) with  cup of berries.  1 apple with 2 teaspoons of peanut butter.  Cut-up vegetables with  cup of hummus.  Low-fat chocolate milk (8 oz or 1 cup).  1 string cheese with 1 medium orange.   of a peanut butter and jelly sandwich on whole-wheat bread (1 tsp of peanut butter).  For 300 calories, you could eat two of those healthy options each day. What is a healthy amount of weight to gain? The recommended amount of weight for you to gain is based on your pre-pregnancy BMI. If your pre-pregnancy BMI was:  Less than 18 (underweight), you should gain 28-40 lb.  18-24.9 (normal), you should gain 25-35 lb.  25-29.9 (overweight), you should gain 15-25 lb.  Greater than 30 (obese), you should gain 11-20 lb.  What if I am having twins or multiples? Generally,  pregnant women who will be having twins or multiples may need to increase their daily calories by 300-600 calories each day. The recommended range for total weight gain is 25-54 lb, depending on your pre-pregnancy BMI. Talk with your health care provider for specific guidance about additional nutritional needs, weight gain, and exercise during your pregnancy. What foods  can I eat? Grains Any grains. Try to choose whole grains, such as whole-wheat bread, oatmeal, or brown rice. Vegetables Any vegetables. Try to eat a variety of colors and types of vegetables to get a full range of vitamins and minerals. Remember to wash your vegetables well before eating. Fruits Any fruits. Try to eat a variety of colors and types of fruit to get a full range of vitamins and minerals. Remember to wash your fruits well before eating. Meats and Other Protein Sources Lean meats, including chicken, [place], fish, and lean cuts of beef, veal, or pork. Make sure that all meats are cooked to "well done." Tofu. Tempeh. Beans. Eggs. Peanut butter and other nut butters. Seafood, such as shrimp, crab, and lobster. If you choose fish, select types that are higher in omega-3 fatty acids, including salmon, herring, mussels, trout, sardines, and pollock. Make sure that all meats are cooked to food-safe temperatures. Dairy Pasteurized milk and milk alternatives. Pasteurized yogurt and pasteurized cheese. Cottage cheese. Sour cream. Beverages Water. Juices that contain 100% fruit juice or vegetable juice. Caffeine-free teas and decaffeinated coffee. Drinks that contain caffeine are okay to drink, but it is better to avoid caffeine. Keep your total caffeine intake to less than 200 mg each day (12 oz of coffee, tea, or soda) or as directed by your health care provider. Condiments Any pasteurized condiments. Sweets and Desserts Any sweets and desserts. Fats and Oils Any fats and oils. The items listed above may not be a complete list of recommended foods or beverages. Contact your dietitian for more options. What foods are not recommended? Vegetables Unpasteurized (raw) vegetable juices. Fruits Unpasteurized (raw) fruit juices. Meats and Other Protein Sources Cured meats that have nitrates, such as bacon, salami, and hotdogs. Luncheon meats, bologna, or other deli meats (unless they are  reheated until they are steaming hot). Refrigerated pate, meat spreads from a meat counter, smoked seafood that is found in the refrigerated section of a store. Raw fish, such as sushi or sashimi. High mercury content fish, such as tilefish, shark, swordfish, and king mackerel. Raw meats, such as tuna or beef tartare. Undercooked meats and poultry. Make sure that all meats are cooked to food-safe temperatures. Dairy Unpasteurized (raw) milk and any foods that have raw milk in them. Soft cheeses, such as feta, queso blanco, queso fresco, Brie, Camembert cheeses, blue-veined cheeses, and Panela cheese (unless it is made with pasteurized milk, which must be stated on the label). Beverages Alcohol. Sugar-sweetened beverages, such as sodas, teas, or energy drinks. Condiments Homemade fermented foods and drinks, such as pickles, sauerkraut, or kombucha drinks. (Store-bought pasteurized versions of these are okay.) Other Salads that are made in the store, such as ham salad, chicken salad, egg salad, tuna salad, and seafood salad. The items listed above may not be a complete list of foods and beverages to avoid. Contact your dietitian for more information. This information is not intended to replace advice given to you by your health care provider. Make sure you discuss any questions you have with your health care provider. Document Released: 01/23/2014 Document Revised: 09/16/2015 Document Reviewed: 09/23/2013 Elsevier Interactive Patient Education  2018  [employer].

## 2017-06-18 ENCOUNTER — Encounter: Payer: Self-pay | Admitting: Certified Nurse Midwife

## 2017-06-18 NOTE — Progress Notes (Signed)
NEW OB HISTORY AND PHYSICAL  SUBJECTIVE:       Angela Knox is a 37 y.o. [id number] female, Patient's last menstrual period was 03/21/2017., Estimated Date of Delivery: 12/26/17, [redacted]w[redacted]d, presents today for establishment of Prenatal Care.  She has no unusual complaints. Endorses resolving nausea.   Denies difficulty breathing or respiratory distress, chest pain, abdominal pain, vaginal bleeding, dysuria, and leg pain or swelling.    Gynecologic History  Patient's last menstrual period was 03/21/2017.   Contraception: none  Last Pap: 01/19/2015. Results were: Negative/Negative  Obstetric History OB History  Gravida Para Term Preterm AB Living  2 0 0 0 1 0  SAB TAB Ectopic Multiple Live Births  1 0 0 0      # Outcome Date GA Lbr Len/2nd Weight Sex Delivery Anes PTL Lv  2 Current           1 SAB 2018 [redacted]w[redacted]d             Past Medical History:  Diagnosis Date  . Depression   . Insomnia     Past Surgical History:  Procedure Laterality Date  . APPENDECTOMY      Current Outpatient Medications on File Prior to Visit  Medication Sig Dispense Refill  . FLUoxetine (PROZAC) 20 MG capsule TAKE ONE CAPSULE BY MOUTH ONCE A DAY 90 capsule 1  . Prenat-FeFum-FePo-FA-Omega 3 (CONCEPT DHA) 53.5-38-1 MG CAPS Take 1 capsule daily by mouth. 30 capsule 12   No current facility-administered medications on file prior to visit.     Allergies  Allergen Reactions  . Sertraline Hcl Itching  . Asa [Aspirin] Itching and Rash    Social History   Socioeconomic History  . Marital status: Married    Spouse name: Not on file  . Number of children: Not on file  . Years of education: Not on file  . Highest education level: Not on file  Social Needs  . Financial resource strain: Not on file  . Food insecurity - worry: Not on file  . Food insecurity - inability: Not on file  . Transportation needs - medical: Not on file  . Transportation needs - non-medical: Not on file  Occupational History   . Not on file  Tobacco Use  . Smoking status: Never Smoker  . Smokeless tobacco: Never Used  Substance and Sexual Activity  . Alcohol use: No    Frequency: Never    Comment: occas  . Drug use: No  . Sexual activity: Yes    Birth control/protection: None  Other Topics Concern  . Not on file  Social History Narrative             Family History  Problem Relation Age of Onset  . Heart disease Brother   . Kidney disease Brother   . Hypertension Brother   . Hyperlipidemia Father   . Cancer Maternal Grandmother     The following portions of the patient's history were reviewed and updated as appropriate: allergies, current medications, past OB history, past medical history, past surgical history, past family history, past social history, and problem list.    OBJECTIVE:  BP 124/71   Pulse (!) 106   Wt 209 lb 12.8 oz (95.2 kg)   LMP 03/21/2017   BMI 34.91 kg/m   Initial Physical Exam (New OB)  GENERAL APPEARANCE: alert, well appearing, in no apparent distress  HEAD: normocephalic, atraumatic  MOUTH: mucous membranes moist, pharynx normal without lesions  THYROID: no thyromegaly or masses present  BREASTS: no masses noted, no significant tenderness, no palpable axillary nodes, no skin changes  LUNGS: clear to auscultation, no wheezes, rales or rhonchi, symmetric air entry  HEART: regular rate and rhythm, no murmurs  ABDOMEN: soft, nontender, nondistended, no abnormal masses, no epigastric pain, obese and FHT present  EXTREMITIES: no redness or tenderness in the calves or thighs, no edema  SKIN: normal coloration and turgor, no rashes  LYMPH NODES: no adenopathy palpable  NEUROLOGIC: alert, oriented, normal speech, no focal findings or movement disorder noted  PELVIC EXAM CERVIX: no lesions or cervical motion tenderness UTERUS: gravid and consistent with 12 weeks ADNEXA: no masses palpable and nontender OB EXAM PELVIMETRY: appears  adequate  ASSESSMENT: Normal pregnancy BMI>34  PLAN: Prenatal care New OB counseling: The patient has been given an overview regarding routine prenatal care. Recommendations regarding diet, weight gain, and exercise in pregnancy were given. Prenatal testing, optional genetic testing, and ultrasound use in pregnancy were reviewed.  Benefits of Breast Feeding were discussed. The patient is encouraged to consider nursing her baby post partum. See orders   Gunnar BullaJenkins Michelle Emmali Karow, CNM [facility], [facility]

## 2017-07-09 ENCOUNTER — Encounter: Payer: Self-pay | Admitting: Obstetrics and Gynecology

## 2017-07-10 ENCOUNTER — Ambulatory Visit (INDEPENDENT_AMBULATORY_CARE_PROVIDER_SITE_OTHER): Payer: BLUE CROSS/BLUE SHIELD | Admitting: Obstetrics and Gynecology

## 2017-07-10 VITALS — BP 114/82 | HR 92 | Wt 204.6 lb

## 2017-07-10 DIAGNOSIS — F32A Depression, unspecified: Secondary | ICD-10-CM

## 2017-07-10 DIAGNOSIS — O99340 Other mental disorders complicating pregnancy, unspecified trimester: Secondary | ICD-10-CM

## 2017-07-10 DIAGNOSIS — F329 Major depressive disorder, single episode, unspecified: Secondary | ICD-10-CM

## 2017-07-10 MED ORDER — ESCITALOPRAM OXALATE 10 MG PO TABS: 10.0000 mg | ORAL_TABLET | Freq: Every day | ORAL | 6 refills | Status: DC

## 2017-07-10 NOTE — Patient Instructions (Signed)
Perinatal Anxiety  When a woman feels excessive tension or worry (anxiety) during pregnancy or during the first 12 months after she gives birth, she has a condition called perinatal anxiety. Anxiety can interfere with work, school, relationships, and other everyday activities. If it is not managed properly, it can also cause problems in the mother and her baby.   If you are pregnant and you have symptoms of an anxiety disorder, it is important to talk with your health care provider.  What are the causes?  The exact cause of this condition is not known. Hormonal changes during and after pregnancy may play a role in causing perinatal anxiety.  What increases the risk?  You are more likely to develop this condition if:   You have a personal or family history of depression, anxiety, or mood disorders.   You experience a stressful life event during pregnancy, such as the death of a loved one.   You have a lot of regular life stress, such as being a single parent.   You have thyroid problems.    What are the signs or symptoms?  Perinatal anxiety can be different for everyone. It may include:   Panic attacks (panic disorder). These are intense episodes of fear or discomfort that may also cause sweating, nausea, shortness of breath, or fear of dying. They usually last 5-15 minutes.   Reliving an upsetting (traumatic) event through distressing thoughts, dreams, or flashbacks (post-traumatic stress disorder, or PTSD).   Excessive worry about multiple problems (generalized anxiety disorder).   Fear and stress about leaving certain people or loved ones (separation anxiety).   Performing repetitive tasks (compulsions) to relieve stress or worry (obsessive compulsive disorder, or OCD).   Fear of certain objects or situations (phobias).   Excessive worrying, such as a constant feeling that something bad is going to happen.   Inability to relax.   Difficulty concentrating.   Sleep problems.   Frequent nightmares or  disturbing thoughts.    How is this diagnosed?  This condition is diagnosed based on a physical exam and mental evaluation. In some cases, your health care provider may use an anxiety screening tool. These tools include a list of questions that can help a health care provider diagnose anxiety. Your health care provider may refer you to a mental health expert who specializes in anxiety.  How is this treated?  This condition may be treated with:   Medicines. Your health care provider will only give you medicines that have been proven safe for pregnancy and breastfeeding.   Talk therapy with a mental health professional to help change your patterns of thinking (cognitive behavioral therapy).   Mindfulness-based stress reduction.   Other relaxation therapies, such as deep breathing or guided muscle relaxation.   Support groups.    Follow these instructions at home:  Lifestyle   Do not use any products that contain nicotine or tobacco, such as cigarettes and e-cigarettes. If you need help quitting, ask your health care provider.   Do not use alcohol when you are pregnant. After your baby is born, limit alcohol intake to no more than 1 drink a day. One drink equals 12 oz of beer, 5 oz of wine, or 1 oz of hard liquor.   Consider joining a support group for new mothers. Ask your health care provider for recommendations.   Take good care of yourself. Make sure you:  ? Get plenty of sleep. If you are having trouble sleeping, talk with your health   care provider.  ? Eat a healthy diet. This includes plenty of fruits and vegetables, whole grains, and lean proteins.  ? Exercise regularly, as told by your health care provider. Ask your health care provider what exercises are safe for you.  General instructions   Take over-the-counter and prescription medicines only as told by your health care provider.   Talk with your partner or family members about your feelings during pregnancy. Share any concerns or fears that you  may have.   Ask for help with tasks or chores when you need it. Ask friends and family members to provide meals, watch your children, or help with cleaning.   Keep all follow-up visits as told by your health care provider. This is important.  Contact a health care provider if:   You (or people close to you) notice that you have any symptoms of anxiety or depression.   You have anxiety and your symptoms get worse.   You experience side effects from medicines, such as nausea or sleep problems.  Get help right away if:   You feel like hurting yourself, your baby, or someone else.  If you ever feel like you may hurt yourself or others, or have thoughts about taking your own life, get help right away. You can go to your nearest emergency department or call:   Your local emergency services (911 in the [place]   A suicide crisis helpline, such as the National Suicide Prevention Lifeline at 1-800-273-8255. This is open 24 hours a day.    Summary   Perinatal anxiety is when a woman feels excessive tension or worry during pregnancy or during the first 12 months after she gives birth.   Perinatal anxiety may include panic attacks, post-traumatic stress disorder, separation anxiety, phobias, or generalized anxiety.   Perinatal anxiety can cause physical health problems in the mother and baby if not properly managed.   This condition is treated with medicines, talk therapy, stress reduction therapies, or a combination of two or more treatments.   Talk with your partner or family members about your concerns or fears. Do not be afraid to ask for help.  This information is not intended to replace advice given to you by your health care provider. Make sure you discuss any questions you have with your health care provider.  Document Released: 06/07/2016 Document Revised: 06/07/2016 Document Reviewed: 06/07/2016  Elsevier Interactive Patient Education  2018 [employer] Inc[employer]

## 2017-07-10 NOTE — Progress Notes (Signed)
OB WORK IN- pt is "feeling down, didn't take a shower all weekend", no apetitie sx started last week

## 2017-07-10 NOTE — Progress Notes (Signed)
Work in [employer]- feels like she is getting more moody, and doesn't want to do anything. Worrying all the time, depression a little worse. Still taking prozac but doesn't seem like it is working. Will switch to lexapro . Can't take zoloft due to allergic reaction.

## 2017-07-11 ENCOUNTER — Encounter: Payer: Self-pay | Admitting: Obstetrics and Gynecology

## 2017-07-18 ENCOUNTER — Ambulatory Visit (INDEPENDENT_AMBULATORY_CARE_PROVIDER_SITE_OTHER): Payer: BLUE CROSS/BLUE SHIELD | Admitting: Obstetrics and Gynecology

## 2017-07-18 VITALS — BP 113/72 | HR 97 | Wt 207.8 lb

## 2017-07-18 DIAGNOSIS — Z3492 Encounter for supervision of normal pregnancy, unspecified, second trimester: Secondary | ICD-10-CM | POA: Diagnosis not present

## 2017-07-18 LAB — POCT URINALYSIS DIPSTICK
Bilirubin, UA: NEGATIVE
Glucose, UA: NEGATIVE
Ketones, UA: NEGATIVE
LEUKOCYTES UA: NEGATIVE
Nitrite, UA: NEGATIVE
PH UA: 6 (ref 5.0–8.0)
PROTEIN UA: NEGATIVE
RBC UA: NEGATIVE
Spec Grav, UA: 1.015 (ref 1.010–1.025)
UROBILINOGEN UA: 0.2 U/dL

## 2017-07-18 NOTE — Progress Notes (Signed)
ROB- pt is doing better, states she feels good

## 2017-07-18 NOTE — Progress Notes (Signed)
ROB-doing well, feels like lexapro is working , anatomy scan next visit,

## 2017-07-25 ENCOUNTER — Encounter: Payer: Self-pay | Admitting: Obstetrics and Gynecology

## 2017-07-30 ENCOUNTER — Encounter: Payer: Self-pay | Admitting: Obstetrics and Gynecology

## 2017-08-07 ENCOUNTER — Ambulatory Visit (INDEPENDENT_AMBULATORY_CARE_PROVIDER_SITE_OTHER): Payer: BLUE CROSS/BLUE SHIELD | Admitting: Obstetrics and Gynecology

## 2017-08-07 ENCOUNTER — Ambulatory Visit (INDEPENDENT_AMBULATORY_CARE_PROVIDER_SITE_OTHER): Payer: BLUE CROSS/BLUE SHIELD

## 2017-08-07 VITALS — BP 116/82 | HR 80 | Wt 207.0 lb

## 2017-08-07 DIAGNOSIS — Z3492 Encounter for supervision of normal pregnancy, unspecified, second trimester: Secondary | ICD-10-CM | POA: Diagnosis not present

## 2017-08-07 NOTE — Progress Notes (Signed)
ROB- anatomy scan done today, pt is doing well 

## 2017-08-07 NOTE — Progress Notes (Signed)
Review anatomy scan:  Findings:  Singleton intrauterine pregnancy is visualized with FHR at 152 BPM. Biometrics give an (U/S) Gestational age of 35 4/7 weeks and an (U/S) EDD of 12/21/17; this correlates with the clinically established EDD of 12/26/17.  Fetal presentation is vertex.  EFW: 356 grams (0lb 13oz). Placenta: Anterior and grade 1. AFI: WNL subjectively.  Enrolled  in classes.  Anatomic survey is complete and appears WNL; Gender - Female.   Right Ovary measures 2.3 x 1.3 x 1.9 cm. It is normal in appearance. Left Ovary measures 2.3 x 1.9 x 1.6 cm. It is normal appearance. There is no obvious evidence of a corpus luteal cyst. Survey of the adnexa demonstrates no adnexal masses. There is no free peritoneal fluid in the cul de sac.  Impression: 1. 20 4/7 week Viable Singleton Intrauterine pregnancy by U/S. 2. (U/S) EDD is consistent with Clinically established (LMP) EDD of 12/26/17. 3. Normal Anatomy Scan

## 2017-08-28 ENCOUNTER — Encounter: Payer: BLUE CROSS/BLUE SHIELD | Admitting: Obstetrics and Gynecology

## 2017-09-04 ENCOUNTER — Other Ambulatory Visit: Payer: BLUE CROSS/BLUE SHIELD

## 2017-09-04 DIAGNOSIS — R300 Dysuria: Secondary | ICD-10-CM

## 2017-09-06 LAB — URINE CULTURE

## 2017-09-12 ENCOUNTER — Ambulatory Visit (INDEPENDENT_AMBULATORY_CARE_PROVIDER_SITE_OTHER): Payer: BLUE CROSS/BLUE SHIELD | Admitting: Obstetrics and Gynecology

## 2017-09-12 VITALS — BP 122/80 | HR 98 | Wt 212.0 lb

## 2017-09-12 DIAGNOSIS — Z3493 Encounter for supervision of normal pregnancy, unspecified, third trimester: Secondary | ICD-10-CM

## 2017-09-12 LAB — POCT URINALYSIS DIPSTICK
Bilirubin, UA: NEGATIVE
Glucose, UA: NEGATIVE
Ketones, UA: NEGATIVE
LEUKOCYTES UA: NEGATIVE
Nitrite, UA: NEGATIVE
PROTEIN UA: NEGATIVE
RBC UA: NEGATIVE
SPEC GRAV UA: 1.01 (ref 1.010–1.025)
Urobilinogen, UA: 0.2 E.U./dL
pH, UA: 6 (ref 5.0–8.0)

## 2017-09-12 NOTE — Progress Notes (Signed)
ROB- pt c/o insomnia, she isnt sleeping much at night, otherwise she is doing well

## 2017-09-12 NOTE — Progress Notes (Signed)
ROB-doing well except sleep recommended sleep now by [facility]. glucose

## 2017-10-02 ENCOUNTER — Encounter: Payer: Self-pay | Admitting: Certified Nurse Midwife

## 2017-10-02 ENCOUNTER — Encounter: Payer: Self-pay | Admitting: Obstetrics and Gynecology

## 2017-10-03 ENCOUNTER — Ambulatory Visit (INDEPENDENT_AMBULATORY_CARE_PROVIDER_SITE_OTHER): Payer: BLUE CROSS/BLUE SHIELD | Admitting: Certified Nurse Midwife

## 2017-10-03 ENCOUNTER — Encounter: Payer: BLUE CROSS/BLUE SHIELD | Admitting: Obstetrics and Gynecology

## 2017-10-03 ENCOUNTER — Other Ambulatory Visit: Payer: BLUE CROSS/BLUE SHIELD

## 2017-10-03 ENCOUNTER — Other Ambulatory Visit: Payer: Self-pay | Admitting: *Deleted

## 2017-10-03 VITALS — BP 120/78 | HR 109 | Wt 210.0 lb

## 2017-10-03 DIAGNOSIS — Z3493 Encounter for supervision of normal pregnancy, unspecified, third trimester: Secondary | ICD-10-CM

## 2017-10-03 LAB — POCT URINALYSIS DIPSTICK
Bilirubin, UA: NEGATIVE
Blood, UA: NEGATIVE
GLUCOSE UA: NEGATIVE
Ketones, UA: NEGATIVE
LEUKOCYTES UA: NEGATIVE
Nitrite, UA: NEGATIVE
PROTEIN UA: NEGATIVE
Spec Grav, UA: 1.025 (ref 1.010–1.025)
Urobilinogen, UA: 0.2 E.U./dL
pH, UA: 5 (ref 5.0–8.0)

## 2017-10-03 MED ORDER — TETANUS-DIPHTH-ACELL PERTUSSIS 5-2.5-18.5 LF-MCG/0.5 IM SUSP
0.5000 mL | Freq: Once | INTRAMUSCULAR | Status: AC
  Administered 2017-10-03: 0.5 mL via INTRAMUSCULAR

## 2017-10-03 MED ORDER — SCOPOLAMINE 1 MG/3DAYS TD PT72: 1.0000 | MEDICATED_PATCH | TRANSDERMAL | 2 refills | Status: DC

## 2017-10-03 NOTE — Patient Instructions (Signed)

## 2017-10-03 NOTE — Progress Notes (Signed)
ROB doing well. Feels good movement. Discussed cord blood donation ( private and  Public) [facility] Pamphlet given. TDAp/BTC/CBC/RPR today. She is considering pill . She asked about taking lexapro. Reviewed use of lexapro in pregnancy she would like to continue taking it. Form for breast pump given.  Follow up in 2 wks.   Doreene BurkeAnnie Yacoub Diltz, CNM

## 2017-10-03 NOTE — Progress Notes (Signed)
Pt is here for an ROB visit. 

## 2017-10-04 ENCOUNTER — Encounter: Payer: Self-pay | Admitting: Certified Nurse Midwife

## 2017-10-04 LAB — CBC
HEMOGLOBIN: 10.9 g/dL — AB (ref 11.1–15.9)
Hematocrit: 32.7 % — ABNORMAL LOW (ref 34.0–46.6)
MCH: 29.5 pg (ref 26.6–33.0)
MCHC: 33.3 g/dL (ref 31.5–35.7)
MCV: 88 fL (ref 79–97)
PLATELETS: 239 10*3/uL (ref 150–450)
RBC: 3.7 x10E6/uL — ABNORMAL LOW (ref 3.77–5.28)
RDW: 13.9 % (ref 12.3–15.4)
WBC: 8.9 10*3/uL (ref 3.4–10.8)

## 2017-10-04 LAB — RPR: RPR: NONREACTIVE

## 2017-10-04 LAB — GLUCOSE, 1 HOUR GESTATIONAL: GESTATIONAL DIABETES SCREEN: 123 mg/dL (ref 65–139)

## 2017-10-06 ENCOUNTER — Encounter (INDEPENDENT_AMBULATORY_CARE_PROVIDER_SITE_OTHER): Payer: Self-pay

## 2017-10-08 ENCOUNTER — Other Ambulatory Visit: Payer: Self-pay

## 2017-10-08 DIAGNOSIS — Z3493 Encounter for supervision of normal pregnancy, unspecified, third trimester: Secondary | ICD-10-CM | POA: Insufficient documentation

## 2017-10-08 DIAGNOSIS — Z3483 Encounter for supervision of other normal pregnancy, third trimester: Secondary | ICD-10-CM

## 2017-10-08 MED ORDER — IRON 325 (65 FE) MG PO TABS: 325.0000 mg | ORAL_TABLET | Freq: Every day | ORAL | 6 refills | Status: DC

## 2017-10-17 ENCOUNTER — Encounter: Payer: Self-pay | Admitting: Certified Nurse Midwife

## 2017-10-17 ENCOUNTER — Ambulatory Visit (INDEPENDENT_AMBULATORY_CARE_PROVIDER_SITE_OTHER): Payer: BLUE CROSS/BLUE SHIELD | Admitting: Certified Nurse Midwife

## 2017-10-17 VITALS — BP 119/82 | HR 100 | Wt 212.3 lb

## 2017-10-17 DIAGNOSIS — Z3493 Encounter for supervision of normal pregnancy, unspecified, third trimester: Secondary | ICD-10-CM

## 2017-10-17 NOTE — Progress Notes (Signed)
ROB- pt is doing well 

## 2017-10-17 NOTE — Progress Notes (Signed)
ROB,reports doing well,good fetal movement. Reviewed red flag symptoms and when to call. Patient verbalize understanding. RTC 2 weeks.  Shanika Creacy,SNM/Arland Usery,CNM

## 2017-10-17 NOTE — Patient Instructions (Signed)

## 2017-10-22 ENCOUNTER — Encounter: Payer: BLUE CROSS/BLUE SHIELD | Admitting: Obstetrics and Gynecology

## 2017-10-23 ENCOUNTER — Encounter: Payer: Self-pay | Admitting: Obstetrics and Gynecology

## 2017-10-31 ENCOUNTER — Ambulatory Visit (INDEPENDENT_AMBULATORY_CARE_PROVIDER_SITE_OTHER): Payer: BLUE CROSS/BLUE SHIELD | Admitting: Obstetrics and Gynecology

## 2017-10-31 VITALS — BP 115/78 | HR 99 | Wt 210.7 lb

## 2017-10-31 DIAGNOSIS — Z3493 Encounter for supervision of normal pregnancy, unspecified, third trimester: Secondary | ICD-10-CM | POA: Diagnosis not present

## 2017-10-31 LAB — POCT URINALYSIS DIPSTICK
Bilirubin, UA: NEGATIVE
Glucose, UA: NEGATIVE
KETONES UA: 5
Leukocytes, UA: NEGATIVE
NITRITE UA: NEGATIVE
PH UA: 6 (ref 5.0–8.0)
PROTEIN UA: NEGATIVE
RBC UA: NEGATIVE
Spec Grav, UA: 1.015 (ref 1.010–1.025)
UROBILINOGEN UA: 0.2 U/dL

## 2017-10-31 NOTE — Progress Notes (Signed)
ROB- pt is doing well 

## 2017-10-31 NOTE — Progress Notes (Signed)
ROB-doing well,  Discussed due date.

## 2017-11-14 ENCOUNTER — Ambulatory Visit (INDEPENDENT_AMBULATORY_CARE_PROVIDER_SITE_OTHER): Payer: BLUE CROSS/BLUE SHIELD | Admitting: Obstetrics and Gynecology

## 2017-11-14 VITALS — BP 119/92 | HR 92 | Wt 212.7 lb

## 2017-11-14 DIAGNOSIS — Z3493 Encounter for supervision of normal pregnancy, unspecified, third trimester: Secondary | ICD-10-CM | POA: Diagnosis not present

## 2017-11-14 LAB — POCT URINALYSIS DIPSTICK
Bilirubin, UA: NEGATIVE
Blood, UA: NEGATIVE
Glucose, UA: NEGATIVE
KETONES UA: 15
Leukocytes, UA: NEGATIVE
NITRITE UA: NEGATIVE
PH UA: 6 (ref 5.0–8.0)
PROTEIN UA: POSITIVE — AB
Spec Grav, UA: 1.015 (ref 1.010–1.025)
UROBILINOGEN UA: 0.2 U/dL

## 2017-11-14 NOTE — Patient Instructions (Signed)

## 2017-11-14 NOTE — Progress Notes (Signed)
ROB- pt has had some headaches, BP elevated today

## 2017-11-14 NOTE — Progress Notes (Signed)
ROB- c/o headache but hasn't taken tylenol. Recheck BPs- 120/92 and 126/86 after sitting in room for a few minutes. Reviewed signs and symptoms of PIH.

## 2017-11-15 ENCOUNTER — Observation Stay
Admission: EM | Admit: 2017-11-15 | Discharge: 2017-11-15 | Disposition: A | Discharge status: Home / Self Care | Payer: BLUE CROSS/BLUE SHIELD | Attending: Certified Nurse Midwife | Admitting: Certified Nurse Midwife

## 2017-11-15 ENCOUNTER — Telehealth: Payer: Self-pay | Admitting: *Deleted

## 2017-11-15 ENCOUNTER — Other Ambulatory Visit: Payer: Self-pay

## 2017-11-15 DIAGNOSIS — O9989 Other specified diseases and conditions complicating pregnancy, childbirth and the puerperium: Secondary | ICD-10-CM

## 2017-11-15 DIAGNOSIS — O99343 Other mental disorders complicating pregnancy, third trimester: Secondary | ICD-10-CM | POA: Diagnosis not present

## 2017-11-15 DIAGNOSIS — O26893 Other specified pregnancy related conditions, third trimester: Principal | ICD-10-CM | POA: Insufficient documentation

## 2017-11-15 DIAGNOSIS — R030 Elevated blood-pressure reading, without diagnosis of hypertension: Secondary | ICD-10-CM | POA: Insufficient documentation

## 2017-11-15 DIAGNOSIS — Z3A34 34 weeks gestation of pregnancy: Secondary | ICD-10-CM | POA: Diagnosis not present

## 2017-11-15 DIAGNOSIS — F329 Major depressive disorder, single episode, unspecified: Secondary | ICD-10-CM | POA: Diagnosis not present

## 2017-11-15 DIAGNOSIS — R51 Headache: Secondary | ICD-10-CM | POA: Insufficient documentation

## 2017-11-15 DIAGNOSIS — R110 Nausea: Secondary | ICD-10-CM | POA: Diagnosis not present

## 2017-11-15 LAB — CBC
HCT: 32.8 % — ABNORMAL LOW (ref 35.0–47.0)
Hemoglobin: 11.4 g/dL — ABNORMAL LOW (ref 12.0–16.0)
MCH: 30.9 pg (ref 26.0–34.0)
MCHC: 34.8 g/dL (ref 32.0–36.0)
MCV: 88.8 fL (ref 80.0–100.0)
PLATELETS: 198 10*3/uL (ref 150–440)
RBC: 3.7 MIL/uL — AB (ref 3.80–5.20)
RDW: 14 % (ref 11.5–14.5)
WBC: 9 10*3/uL (ref 3.6–11.0)

## 2017-11-15 LAB — COMPREHENSIVE METABOLIC PANEL
ALK PHOS: 128 U/L — AB (ref 38–126)
ALT: 13 U/L (ref 0–44)
ANION GAP: 7 (ref 5–15)
AST: 21 U/L (ref 15–41)
Albumin: 2.8 g/dL — ABNORMAL LOW (ref 3.5–5.0)
BILIRUBIN TOTAL: 0.4 mg/dL (ref 0.3–1.2)
BUN: 7 mg/dL (ref 6–20)
CALCIUM: 8.6 mg/dL — AB (ref 8.9–10.3)
CO2: 23 mmol/L (ref 22–32)
CREATININE: 0.64 mg/dL (ref 0.44–1.00)
Chloride: 105 mmol/L (ref 98–111)
Glucose, Bld: 132 mg/dL — ABNORMAL HIGH (ref 70–99)
Potassium: 3.5 mmol/L (ref 3.5–5.1)
SODIUM: 135 mmol/L (ref 135–145)
TOTAL PROTEIN: 6 g/dL — AB (ref 6.5–8.1)

## 2017-11-15 LAB — PROTEIN / CREATININE RATIO, URINE
CREATININE, URINE: 308 mg/dL
Protein Creatinine Ratio: 0.09 mg/mg{Cre} (ref 0.00–0.15)
Total Protein, Urine: 29 mg/dL

## 2017-11-15 MED ORDER — HYDRALAZINE HCL 20 MG/ML IJ SOLN: 10.0000 mg | Freq: Once | INTRAMUSCULAR | Status: DC | PRN

## 2017-11-15 MED ORDER — ACETAMINOPHEN 500 MG PO TABS: 1000.0000 mg | ORAL_TABLET | Freq: Four times a day (QID) | ORAL | 0 refills | Status: DC | PRN

## 2017-11-15 MED ORDER — LABETALOL HCL 5 MG/ML IV SOLN: 20.0000 mg | INTRAVENOUS | Status: DC | PRN

## 2017-11-15 MED ORDER — ACETAMINOPHEN 500 MG PO TABS
1000.0000 mg | ORAL_TABLET | Freq: Four times a day (QID) | ORAL | Status: DC | PRN
  Administered 2017-11-15: 1000 mg via ORAL
  Filled 2017-11-15: qty 2

## 2017-11-15 NOTE — Discharge Instructions (Signed)
Preeclampsia and Eclampsia °Preeclampsia is a serious condition that develops only during pregnancy. It is also called toxemia of pregnancy. This condition causes high blood pressure along with other symptoms, such as swelling and headaches. These symptoms may develop as the condition gets worse. Preeclampsia may occur at 20 weeks of pregnancy or later. °Diagnosing and treating preeclampsia early is very important. If not treated early, it can cause serious problems for you and your baby. One problem it can lead to is eclampsia, which is a condition that causes muscle jerking or shaking (convulsions or seizures) in the mother. Delivering your baby is the best treatment for preeclampsia or eclampsia. Preeclampsia and eclampsia symptoms usually go away after your baby is born. °What are the causes? °The cause of preeclampsia is not known. °What increases the risk? °The following risk factors make you more likely to develop preeclampsia: °· Being pregnant for the first time. °· Having had preeclampsia during a past pregnancy. °· Having a family history of preeclampsia. °· Having high blood pressure. °· Being pregnant with twins or triplets. °· Being 35 or older. °· Being African-American. °· Having kidney disease or diabetes. °· Having medical conditions such as lupus or blood diseases. °· Being very overweight (obese). ° °What are the signs or symptoms? °The earliest signs of preeclampsia are: °· High blood pressure. °· Increased protein in your urine. Your health care provider will check for this at every visit before you give birth (prenatal visit). ° °Other symptoms that may develop as the condition gets worse include: °· Severe headaches. °· Sudden weight gain. °· Swelling of the hands, face, legs, and feet. °· Nausea and vomiting. °· Vision problems, such as blurred or double vision. °· Numbness in the face, arms, legs, and feet. °· Urinating less than usual. °· Dizziness. °· Slurred speech. °· Abdominal pain,  especially upper abdominal pain. °· Convulsions or seizures. ° °Symptoms generally go away after giving birth. °How is this diagnosed? °There are no screening tests for preeclampsia. Your health care provider will ask you about symptoms and check for signs of preeclampsia during your prenatal visits. You may also have tests that include: °· Urine tests. °· Blood tests. °· Checking your blood pressure. °· Monitoring your baby’s heart rate. °· Ultrasound. ° °How is this treated? °You and your health care provider will determine the treatment approach that is best for you. Treatment may include: °· Having more frequent prenatal exams to check for signs of preeclampsia, if you have an increased risk for preeclampsia. °· Bed rest. °· Reducing how much salt (sodium) you eat. °· Medicine to lower your blood pressure. °· Staying in the hospital, if your condition is severe. There, treatment will focus on controlling your blood pressure and the amount of fluids in your body (fluid retention). °· You may need to take medicine (magnesium sulfate) to prevent seizures. This medicine may be given as an injection or through an IV tube. °· Delivering your baby early, if your condition gets worse. You may have your labor started with medicine (induced), or you may have a cesarean delivery. ° °Follow these instructions at home: °Eating and drinking ° °· Drink enough fluid to keep your urine clear or pale yellow. °· Eat a healthy diet that is low in sodium. Do not add salt to your food. Check nutrition labels to see how much sodium a food or beverage contains. °· Avoid caffeine. °Lifestyle °· Do not use any products that contain nicotine or tobacco, such as cigarettes   and e-cigarettes. If you need help quitting, ask your health care provider. °· Do not use alcohol or drugs. °· Avoid stress as much as possible. Rest and get plenty of sleep. °General instructions °· Take over-the-counter and prescription medicines only as told by your  health care provider. °· When lying down, lie on your side. This keeps pressure off of your baby. °· When sitting or lying down, raise (elevate) your feet. Try putting some pillows underneath your lower legs. °· Exercise regularly. Ask your health care provider what kinds of exercise are best for you. °· Keep all follow-up and prenatal visits as told by your health care provider. This is important. °How is this prevented? °To prevent preeclampsia or eclampsia from developing during another pregnancy: °· Get proper medical care during pregnancy. Your health care provider may be able to prevent preeclampsia or diagnose and treat it early. °· Your health care provider may have you take a low-dose aspirin or a calcium supplement during your next pregnancy. °· You may have tests of your blood pressure and kidney function after giving birth. °· Maintain a healthy weight. Ask your health care provider for help managing weight gain during pregnancy. °· Work with your health care provider to manage any long-term (chronic) health conditions you have, such as diabetes or kidney problems. ° °Contact a health care provider if: °· You gain more weight than expected. °· You have headaches. °· You have nausea or vomiting. °· You have abdominal pain. °· You feel dizzy or light-headed. °Get help right away if: °· You develop sudden or severe swelling anywhere in your body. This usually happens in the legs. °· You gain 5 lbs (2.3 kg) or more during one week. °· You have severe: °? Abdominal pain. °? Headaches. °? Dizziness. °? Vision problems. °? Confusion. °? Nausea or vomiting. °· You have a seizure. °· You have trouble moving any part of your body. °· You develop numbness in any part of your body. °· You have trouble speaking. °· You have any abnormal bleeding. °· You pass out. °This information is not intended to replace advice given to you by your health care provider. Make sure you discuss any questions you have with your health  care provider. °Document Released: 04/07/2000 Document Revised: 12/07/2015 Document Reviewed: 11/15/2015 °Elsevier Interactive Patient Education © 2018 Elsevier Inc. ° °

## 2017-11-15 NOTE — Telephone Encounter (Signed)
Pt called this am, c/o headache and having some blurred vision, advised pt to go to ED, they would get her up to L/D and monitor her and do some labs, pt voiced understanding

## 2017-11-15 NOTE — OB Triage Note (Signed)
Pt c/o beign nausea and sick this morning. She had called the office and they advised her to come to L&D to be seen. She had some blurred, however her B/P has been normal.

## 2017-11-19 NOTE — Discharge Summary (Signed)
Obstetric Discharge Summary  Patient ID: Angela Knox MRN: [id number] DOB/AGE: 35-Jan-1982 35 y.o.   Date of Admission: 11/15/2017  Date of Discharge: 11/15/2017  Admitting Diagnosis: Observation at [redacted]w[redacted]d  Secondary Diagnosis: History of depression-taking Lexapro     Discharge Diagnosis: No other diagnosis   Antepartum Procedures: NST, Serial blood pressures, and PIH labs   Brief Hospital Course    L&D OB Triage Note  Angela Knox is a 37 y.o. [id number] female at [redacted]w[redacted]d, EDD Estimated Date of Delivery: 12/26/17 who presented to triage for complaints of nausea, headache and elevated blood pressure on home monitor.  She was evaluated by the nurses with no significant findings for pre-eclampsia, fetal distress, or preterm labor. An NST was performed and has been reviewed by CNM.   NST INTERPRETATION:  Mode: External Baseline Rate (A): 140 bpm Variability: Moderate Accelerations: 15 x 15 Decelerations: None     Contraction Frequency (min): none captured  Impression: reactive  VITAL SIGNS  BP 116/79   Pulse (!) 103   Temp 97.9 F (36.6 C) (Oral)   Resp 16   LMP 03/21/2017   CBC    Component Value Date/Time   WBC 9.0 11/15/2017 1116   RBC 3.70 (L) 11/15/2017 1116   HGB 11.4 (L) 11/15/2017 1116   HCT 32.8 (L) 11/15/2017 1116   PLT 198 11/15/2017 1116   MCV 88.8 11/15/2017 1116   MCH 30.9 11/15/2017 1116   MCHC 34.8 11/15/2017 1116   RDW 14.0 11/15/2017 1116   CMP     Component Value Date/Time   NA 135 11/15/2017 1116   K 3.5 11/15/2017 1116   CL 105 11/15/2017 1116   CO2 23 11/15/2017 1116   GLUCOSE 132 (H) 11/15/2017 1116   BUN 7 11/15/2017 1116   CREATININE 0.64 11/15/2017 1116   CALCIUM 8.6 (L) 11/15/2017 1116   PROT 6.0 (L) 11/15/2017 1116   ALBUMIN 2.8 (L) 11/15/2017 1116   AST 21 11/15/2017 1116   ALT 13 11/15/2017 1116   ALKPHOS 128 (H) 11/15/2017 1116   BILITOT 0.4 11/15/2017 1116   GFRNONAA >60 11/15/2017 1116   GFRAA >60 11/15/2017 1116     Urine Protein/Creatinine ratio 0.09  Plan: NST performed was reviewed and was found to be reactive. She was discharged home with bleeding/labor precautions.  Continue routine prenatal care. Follow up with CNM as previously scheduled.    Discharge Instructions: Per After Visit Summary. Activity: Advance as tolerated. Pelvic rest for 6 weeks.  Also refer to After Visit Summary Diet: Regular Medications: Allergies as of 11/15/2017      Reactions   Sertraline Hcl Itching   Asa [aspirin] Itching, Rash      Medication List    TAKE these medications   acetaminophen 500 MG tablet Commonly known as:  TYLENOL Take 2 tablets (1,000 mg total) by mouth every 6 (six) hours as needed for fever or headache.   CONCEPT DHA 53.5-38-1 MG Caps Take 1 capsule daily by mouth.   escitalopram 10 MG tablet Commonly known as:  LEXAPRO Take 1 tablet (10 mg total) by mouth daily.   Iron 325 (65 Fe) MG Tabs Take 1 tablet (325 mg total) by mouth daily.      Outpatient follow up:  Follow-up Information    [facility] Follow up.   Why:  Follow up as previously scheduled Contact information: [address]  [address] [place] [address] [phone]          Discharged Condition:  stable  Discharged to: home   Angela Knox Angela Knox, CNM [facility], [facility] 11/19/17 3:05 AM

## 2017-11-23 ENCOUNTER — Ambulatory Visit (INDEPENDENT_AMBULATORY_CARE_PROVIDER_SITE_OTHER): Payer: BLUE CROSS/BLUE SHIELD | Admitting: Certified Nurse Midwife

## 2017-11-23 VITALS — BP 115/83 | HR 95 | Wt 213.6 lb

## 2017-11-23 DIAGNOSIS — O368131 Decreased fetal movements, third trimester, fetus 1: Secondary | ICD-10-CM | POA: Diagnosis not present

## 2017-11-23 DIAGNOSIS — Z3403 Encounter for supervision of normal first pregnancy, third trimester: Secondary | ICD-10-CM | POA: Diagnosis not present

## 2017-11-23 LAB — POCT URINALYSIS DIPSTICK
Bilirubin, UA: NEGATIVE
Blood, UA: NEGATIVE
Glucose, UA: NEGATIVE
Leukocytes, UA: NEGATIVE
NITRITE UA: NEGATIVE
PROTEIN UA: POSITIVE — AB
Spec Grav, UA: 1.02 (ref 1.010–1.025)
Urobilinogen, UA: 0.2 E.U./dL
pH, UA: 5 (ref 5.0–8.0)

## 2017-11-23 NOTE — Progress Notes (Signed)
Pt presents today for problem visit. She has had a headache x 2 days that has not improved with Fioricet. She is also having right upper quadrant pain intermittently. She states she has seen"floaters" prior to pregnancy but it has increased recently. Reflexes 2+ bilaterally, negative clonus, 1 + edema. She complains of decreased fetal movement over the last day. Discussed use of Fioricet affecting movement. She verbalizes understanding. Labs today. Reassurance given . Red flag symptoms reviewed.   NST: Reactive /category 1  Baseline:130 Variability: moderate Accelerations: present Decelerations: absent  Toco: none  Doreene BurkeAnnie Nas Wafer, CNM

## 2017-11-23 NOTE — Patient Instructions (Addendum)
Group B Streptococcus Infection During Pregnancy Group B Streptococcus (GBS) is a type of bacteria (Streptococcus agalactiae) that is often found in healthy people, commonly in the rectum, vagina, and intestines. In people who are healthy and not pregnant, the bacteria rarely cause serious illness or complications. However, women who test positive for GBS during pregnancy can pass the bacteria to their baby during childbirth, which can cause serious infection in the baby after birth. Women with GBS may also have infections during their pregnancy or immediately after childbirth, such as such as urinary tract infections (UTIs) or infections of the uterus (uterine infections). Having GBS also increases a woman's risk of complications during pregnancy, such as early (preterm) labor or delivery, miscarriage, or stillbirth. Routine testing (screening) for GBS is recommended for all pregnant women. What increases the risk? You may have a higher risk for GBS infection during pregnancy if you had one during a past pregnancy. What are the signs or symptoms? In most cases, GBS infection does not cause symptoms in pregnant women. Signs and symptoms of a possible GBS-related infection may include:  Labor starting before the 37th week of pregnancy.  A UTI or bladder infection, which may cause: ? Fever. ? Pain or burning during urination. ? Frequent urination.  Fever during labor, along with: ? Bad-smelling discharge. ? Uterine tenderness. ? Rapid heartbeat in the mother, baby, or both.  Rare but serious symptoms of a possible GBS-related infection in women include:  Blood infection (septicemia). This may cause fever, chills, or confusion.  Lung infection (pneumonia). This may cause fever, chills, cough, rapid breathing, difficulty breathing, or chest pain.  Bone, joint, skin, or soft tissue infection.  How is this diagnosed? You may be screened for GBS between week 35 and week 37 of your pregnancy. If  you have symptoms of preterm labor, you may be screened earlier. This condition is diagnosed based on lab test results from:  A swab of fluid from the vagina and rectum.  A urine sample.  How is this treated? This condition is treated with antibiotic medicine. When you go into labor, or as soon as your water breaks (your membranes rupture), you will be given antibiotics through an IV tube. Antibiotics will continue until after you give birth. If you are having a cesarean delivery, you do not need antibiotics unless your membranes have already ruptured. Follow these instructions at home:  Take over-the-counter and prescription medicines only as told by your health care provider.  Take your antibiotic medicine as told by your health care provider. Do not stop taking the antibiotic even if you start to feel better.  Keep all pre-birth (prenatal) visits and follow-up visits as told by your health care provider. This is important. Contact a health care provider if:  You have pain or burning when you urinate.  You have to urinate frequently.  You have a fever or chills.  You develop a bad-smelling vaginal discharge. Get help right away if:  Your membranes rupture.  You go into labor.  You have severe pain in your abdomen.  You have difficulty breathing.  You have chest pain. This information is not intended to replace advice given to you by your health care provider. Make sure you discuss any questions you have with your health care provider. Document Released: 07/18/2007 Document Revised: 11/05/2015 Document Reviewed: 11/04/2015 Elsevier Interactive Patient Education  2018 [employer]  Preeclampsia and Eclampsia Preeclampsia is a serious condition that develops only during pregnancy. It is also called toxemia   of pregnancy. This condition causes high blood pressure along with other symptoms, such as swelling and headaches. These symptoms may develop as the condition gets worse.  Preeclampsia may occur at 20 weeks of pregnancy or later. Diagnosing and treating preeclampsia early is very important. If not treated early, it can cause serious problems for you and your baby. One problem it can lead to is eclampsia, which is a condition that causes muscle jerking or shaking (convulsions or seizures) in the mother. Delivering your baby is the best treatment for preeclampsia or eclampsia. Preeclampsia and eclampsia symptoms usually go away after your baby is born. What are the causes? The cause of preeclampsia is not known. What increases the risk? The following risk factors make you more likely to develop preeclampsia:  Being pregnant for the first time.  Having had preeclampsia during a past pregnancy.  Having a family history of preeclampsia.  Having high blood pressure.  Being pregnant with twins or triplets.  Being 35 or older.  Being African-American.  Having kidney disease or diabetes.  Having medical conditions such as lupus or blood diseases.  Being very overweight (obese).  What are the signs or symptoms? The earliest signs of preeclampsia are:  High blood pressure.  Increased protein in your urine. Your health care provider will check for this at every visit before you give birth (prenatal visit).  Other symptoms that may develop as the condition gets worse include:  Severe headaches.  Sudden weight gain.  Swelling of the hands, face, legs, and feet.  Nausea and vomiting.  Vision problems, such as blurred or double vision.  Numbness in the face, arms, legs, and feet.  Urinating less than usual.  Dizziness.  Slurred speech.  Abdominal pain, especially upper abdominal pain.  Convulsions or seizures.  Symptoms generally go away after giving birth. How is this diagnosed? There are no screening tests for preeclampsia. Your health care provider will ask you about symptoms and check for signs of preeclampsia during your prenatal  visits. You may also have tests that include:  Urine tests.  Blood tests.  Checking your blood pressure.  Monitoring your baby's heart rate.  Ultrasound.  How is this treated? You and your health care provider will determine the treatment approach that is best for you. Treatment may include:  Having more frequent prenatal exams to check for signs of preeclampsia, if you have an increased risk for preeclampsia.  Bed rest.  Reducing how much salt (sodium) you eat.  Medicine to lower your blood pressure.  Staying in the hospital, if your condition is severe. There, treatment will focus on controlling your blood pressure and the amount of fluids in your body (fluid retention).  You may need to take medicine (magnesium sulfate) to prevent seizures. This medicine may be given as an injection or through an IV tube.  Delivering your baby early, if your condition gets worse. You may have your labor started with medicine (induced), or you may have a cesarean delivery.  Follow these instructions at home: Eating and drinking   Drink enough fluid to keep your urine clear or pale yellow.  Eat a healthy diet that is low in sodium. Do not add salt to your food. Check nutrition labels to see how much sodium a food or beverage contains.  Avoid caffeine. Lifestyle  Do not use any products that contain nicotine or tobacco, such as cigarettes and e-cigarettes. If you need help quitting, ask your health care provider.  Do not use alcohol   or drugs.  Avoid stress as much as possible. Rest and get plenty of sleep. General instructions  Take over-the-counter and prescription medicines only as told by your health care provider.  When lying down, lie on your side. This keeps pressure off of your baby.  When sitting or lying down, raise (elevate) your feet. Try putting some pillows underneath your lower legs.  Exercise regularly. Ask your health care provider what kinds of exercise are best  for you.  Keep all follow-up and prenatal visits as told by your health care provider. This is important. How is this prevented? To prevent preeclampsia or eclampsia from developing during another pregnancy:  Get proper medical care during pregnancy. Your health care provider may be able to prevent preeclampsia or diagnose and treat it early.  Your health care provider may have you take a low-dose aspirin or a calcium supplement during your next pregnancy.  You may have tests of your blood pressure and kidney function after giving birth.  Maintain a healthy weight. Ask your health care provider for help managing weight gain during pregnancy.  Work with your health care provider to manage any long-term (chronic) health conditions you have, such as diabetes or kidney problems.  Contact a health care provider if:  You gain more weight than expected.  You have headaches.  You have nausea or vomiting.  You have abdominal pain.  You feel dizzy or light-headed. Get help right away if:  You develop sudden or severe swelling anywhere in your body. This usually happens in the legs.  You gain 5 lbs (2.3 kg) or more during one week.  You have severe: ? Abdominal pain. ? Headaches. ? Dizziness. ? Vision problems. ? Confusion. ? Nausea or vomiting.  You have a seizure.  You have trouble moving any part of your body.  You develop numbness in any part of your body.  You have trouble speaking.  You have any abnormal bleeding.  You pass out. This information is not intended to replace advice given to you by your health care provider. Make sure you discuss any questions you have with your health care provider. Document Released: 04/07/2000 Document Revised: 12/07/2015 Document Reviewed: 11/15/2015 Elsevier Interactive Patient Education  2018 [employer]  

## 2017-11-23 NOTE — Progress Notes (Signed)
Pt is here with c/o migraine X 2 days.Has right upper quadrant pain and is nauseous. Also has diarrhea.

## 2017-11-24 ENCOUNTER — Encounter (INDEPENDENT_AMBULATORY_CARE_PROVIDER_SITE_OTHER): Payer: Self-pay

## 2017-11-24 LAB — PROTEIN / CREATININE RATIO, URINE
Creatinine, Urine: 114.1 mg/dL
PROTEIN UR: 16.7 mg/dL
PROTEIN/CREAT RATIO: 146 mg/g{creat} (ref 0–200)

## 2017-11-24 LAB — COMPREHENSIVE METABOLIC PANEL
ALK PHOS: 156 IU/L — AB (ref 39–117)
ALT: 12 IU/L (ref 0–32)
AST: 15 IU/L (ref 0–40)
Albumin/Globulin Ratio: 1.5 (ref 1.2–2.2)
Albumin: 3.3 g/dL — ABNORMAL LOW (ref 3.5–5.5)
BUN/Creatinine Ratio: 11 (ref 9–23)
BUN: 7 mg/dL (ref 6–20)
CHLORIDE: 102 mmol/L (ref 96–106)
CO2: 21 mmol/L (ref 20–29)
CREATININE: 0.62 mg/dL (ref 0.57–1.00)
Calcium: 8.7 mg/dL (ref 8.7–10.2)
GFR calc Af Amer: 135 mL/min/{1.73_m2} (ref >59)
GFR calc non Af Amer: 117 mL/min/{1.73_m2} (ref >59)
GLUCOSE: 126 mg/dL — AB (ref 65–99)
Globulin, Total: 2.2 g/dL (ref 1.5–4.5)
POTASSIUM: 4 mmol/L (ref 3.5–5.2)
Sodium: 137 mmol/L (ref 134–144)
Total Protein: 5.5 g/dL — ABNORMAL LOW (ref 6.0–8.5)

## 2017-11-24 LAB — CBC WITH DIFFERENTIAL/PLATELET
BASOS ABS: 0 10*3/uL (ref 0.0–0.2)
Basos: 0 %
EOS (ABSOLUTE): 0 10*3/uL (ref 0.0–0.4)
Eos: 1 %
Hematocrit: 36.5 % (ref 34.0–46.6)
Hemoglobin: 11.6 g/dL (ref 11.1–15.9)
IMMATURE GRANS (ABS): 0 10*3/uL (ref 0.0–0.1)
Immature Granulocytes: 0 %
Lymphocytes Absolute: 1.5 10*3/uL (ref 0.7–3.1)
Lymphs: 21 %
MCH: 29.4 pg (ref 26.6–33.0)
MCHC: 31.8 g/dL (ref 31.5–35.7)
MCV: 92 fL (ref 79–97)
MONOCYTES: 6 %
Monocytes Absolute: 0.4 10*3/uL (ref 0.1–0.9)
NEUTROS ABS: 4.9 10*3/uL (ref 1.4–7.0)
NEUTROS PCT: 72 %
PLATELETS: 202 10*3/uL (ref 150–450)
RBC: 3.95 x10E6/uL (ref 3.77–5.28)
RDW: 14 % (ref 12.3–15.4)
WBC: 6.9 10*3/uL (ref 3.4–10.8)

## 2017-11-28 ENCOUNTER — Ambulatory Visit (INDEPENDENT_AMBULATORY_CARE_PROVIDER_SITE_OTHER): Payer: BLUE CROSS/BLUE SHIELD | Admitting: Obstetrics and Gynecology

## 2017-11-28 VITALS — BP 124/90 | HR 87 | Wt 213.5 lb

## 2017-11-28 DIAGNOSIS — Z3493 Encounter for supervision of normal pregnancy, unspecified, third trimester: Secondary | ICD-10-CM

## 2017-11-28 DIAGNOSIS — Z3685 Encounter for antenatal screening for Streptococcus B: Secondary | ICD-10-CM

## 2017-11-28 DIAGNOSIS — Z113 Encounter for screening for infections with a predominantly sexual mode of transmission: Secondary | ICD-10-CM

## 2017-11-28 LAB — POCT URINALYSIS DIPSTICK
BILIRUBIN UA: NEGATIVE
Blood, UA: NEGATIVE
GLUCOSE UA: NEGATIVE
KETONES UA: NEGATIVE
NITRITE UA: NEGATIVE
PROTEIN UA: POSITIVE — AB
Spec Grav, UA: 1.015 (ref 1.010–1.025)
Urobilinogen, UA: 0.2 E.U./dL
pH, UA: 6.5 (ref 5.0–8.0)

## 2017-11-28 NOTE — Progress Notes (Signed)
ROB - cultures obtained. Considering decreasing work hours for remainder of pregnancy. Labor precautions discussed along with PIH

## 2017-11-28 NOTE — Patient Instructions (Signed)
Braxton Hicks Contractions °Contractions of the uterus can occur throughout pregnancy, but they are not always a sign that you are in labor. You may have practice contractions called Braxton Hicks contractions. These false labor contractions are sometimes confused with true labor. °What are Braxton Hicks contractions? °Braxton Hicks contractions are tightening movements that occur in the muscles of the uterus before labor. Unlike true labor contractions, these contractions do not result in opening (dilation) and thinning of the cervix. Toward the end of pregnancy (32-34 weeks), Braxton Hicks contractions can happen more often and may become stronger. These contractions are sometimes difficult to tell apart from true labor because they can be very uncomfortable. You should not feel embarrassed if you go to the hospital with false labor. °Sometimes, the only way to tell if you are in true labor is for your health care provider to look for changes in the cervix. The health care provider will do a physical exam and may monitor your contractions. If you are not in true labor, the exam should show that your cervix is not dilating and your water has not broken. °If there are other health problems associated with your pregnancy, it is completely safe for you to be sent home with false labor. You may continue to have Braxton Hicks contractions until you go into true labor. °How to tell the difference between true labor and false labor °True labor °· Contractions last 30-70 seconds. °· Contractions become very regular. °· Discomfort is usually felt in the top of the uterus, and it spreads to the lower abdomen and low back. °· Contractions do not go away with walking. °· Contractions usually become more intense and increase in frequency. °· The cervix dilates and gets thinner. °False labor °· Contractions are usually shorter and not as strong as true labor contractions. °· Contractions are usually irregular. °· Contractions  are often felt in the front of the lower abdomen and in the groin. °· Contractions may go away when you walk around or change positions while lying down. °· Contractions get weaker and are shorter-lasting as time goes on. °· The cervix usually does not dilate or become thin. °Follow these instructions at home: °· Take over-the-counter and prescription medicines only as told by your health care provider. °· Keep up with your usual exercises and follow other instructions from your health care provider. °· Eat and drink lightly if you think you are going into labor. °· If Braxton Hicks contractions are making you uncomfortable: °? Change your position from lying down or resting to walking, or change from walking to resting. °? Sit and rest in a tub of warm water. °? Drink enough fluid to keep your urine pale yellow. Dehydration may cause these contractions. °? Do slow and deep breathing several times an hour. °· Keep all follow-up prenatal visits as told by your health care provider. This is important. °Contact a health care provider if: °· You have a fever. °· You have continuous pain in your abdomen. °Get help right away if: °· Your contractions become stronger, more regular, and closer together. °· You have fluid leaking or gushing from your vagina. °· You pass blood-tinged mucus (bloody show). °· You have bleeding from your vagina. °· You have low back pain that you never had before. °· You feel your baby’s head pushing down and causing pelvic pressure. °· Your baby is not moving inside you as much as it used to. °Summary °· Contractions that occur before labor are called Braxton   Hicks contractions, false labor, or practice contractions. °· Braxton Hicks contractions are usually shorter, weaker, farther apart, and less regular than true labor contractions. True labor contractions usually become progressively stronger and regular and they become more frequent. °· Manage discomfort from Braxton Hicks contractions by  changing position, resting in a warm bath, drinking plenty of water, or practicing deep breathing. °This information is not intended to replace advice given to you by your health care provider. Make sure you discuss any questions you have with your health care provider. °Document Released: 08/24/2016 Document Revised: 08/24/2016 Document Reviewed: 08/24/2016 °Elsevier Interactive Patient Education © 2018 [employer] ° °

## 2017-11-28 NOTE — Progress Notes (Signed)
ROB- cultures obtained, pt had headache this am

## 2017-12-01 LAB — STREP GP B NAA: STREP GROUP B AG: NEGATIVE

## 2017-12-02 LAB — GC/CHLAMYDIA PROBE AMP
Chlamydia trachomatis, NAA: NEGATIVE
Neisseria gonorrhoeae by PCR: NEGATIVE

## 2017-12-03 ENCOUNTER — Encounter: Payer: Self-pay | Admitting: Certified Nurse Midwife

## 2017-12-03 ENCOUNTER — Ambulatory Visit (INDEPENDENT_AMBULATORY_CARE_PROVIDER_SITE_OTHER): Payer: BLUE CROSS/BLUE SHIELD | Admitting: Certified Nurse Midwife

## 2017-12-03 VITALS — BP 120/89 | HR 88 | Wt 214.3 lb

## 2017-12-03 DIAGNOSIS — Z3403 Encounter for supervision of normal first pregnancy, third trimester: Secondary | ICD-10-CM

## 2017-12-03 LAB — POCT URINALYSIS DIPSTICK
BILIRUBIN UA: NEGATIVE
Blood, UA: NEGATIVE
Glucose, UA: NEGATIVE
KETONES UA: NEGATIVE
Leukocytes, UA: NEGATIVE
Nitrite, UA: NEGATIVE
PROTEIN UA: NEGATIVE
Spec Grav, UA: 1.025 (ref 1.010–1.025)
Urobilinogen, UA: 0.2 E.U./dL
pH, UA: 5 (ref 5.0–8.0)

## 2017-12-03 NOTE — Progress Notes (Signed)
Pt states she had a terrible headache yesterday 146/97 and states she didn't feel well at all. Decreased fetal movement. Also c/o terrible pain upper right side near axilla.

## 2017-12-03 NOTE — Patient Instructions (Signed)
Preeclampsia and Eclampsia °Preeclampsia is a serious condition that develops only during pregnancy. It is also called toxemia of pregnancy. This condition causes high blood pressure along with other symptoms, such as swelling and headaches. These symptoms may develop as the condition gets worse. Preeclampsia may occur at 20 weeks of pregnancy or later. °Diagnosing and treating preeclampsia early is very important. If not treated early, it can cause serious problems for you and your baby. One problem it can lead to is eclampsia, which is a condition that causes muscle jerking or shaking (convulsions or seizures) in the mother. Delivering your baby is the best treatment for preeclampsia or eclampsia. Preeclampsia and eclampsia symptoms usually go away after your baby is born. °What are the causes? °The cause of preeclampsia is not known. °What increases the risk? °The following risk factors make you more likely to develop preeclampsia: °· Being pregnant for the first time. °· Having had preeclampsia during a past pregnancy. °· Having a family history of preeclampsia. °· Having high blood pressure. °· Being pregnant with twins or triplets. °· Being 35 or older. °· Being African-American. °· Having kidney disease or diabetes. °· Having medical conditions such as lupus or blood diseases. °· Being very overweight (obese). ° °What are the signs or symptoms? °The earliest signs of preeclampsia are: °· High blood pressure. °· Increased protein in your urine. Your health care provider will check for this at every visit before you give birth (prenatal visit). ° °Other symptoms that may develop as the condition gets worse include: °· Severe headaches. °· Sudden weight gain. °· Swelling of the hands, face, legs, and feet. °· Nausea and vomiting. °· Vision problems, such as blurred or double vision. °· Numbness in the face, arms, legs, and feet. °· Urinating less than usual. °· Dizziness. °· Slurred speech. °· Abdominal pain,  especially upper abdominal pain. °· Convulsions or seizures. ° °Symptoms generally go away after giving birth. °How is this diagnosed? °There are no screening tests for preeclampsia. Your health care provider will ask you about symptoms and check for signs of preeclampsia during your prenatal visits. You may also have tests that include: °· Urine tests. °· Blood tests. °· Checking your blood pressure. °· Monitoring your baby’s heart rate. °· Ultrasound. ° °How is this treated? °You and your health care provider will determine the treatment approach that is best for you. Treatment may include: °· Having more frequent prenatal exams to check for signs of preeclampsia, if you have an increased risk for preeclampsia. °· Bed rest. °· Reducing how much salt (sodium) you eat. °· Medicine to lower your blood pressure. °· Staying in the hospital, if your condition is severe. There, treatment will focus on controlling your blood pressure and the amount of fluids in your body (fluid retention). °· You may need to take medicine (magnesium sulfate) to prevent seizures. This medicine may be given as an injection or through an IV tube. °· Delivering your baby early, if your condition gets worse. You may have your labor started with medicine (induced), or you may have a cesarean delivery. ° °Follow these instructions at home: °Eating and drinking ° °· Drink enough fluid to keep your urine clear or pale yellow. °· Eat a healthy diet that is low in sodium. Do not add salt to your food. Check nutrition labels to see how much sodium a food or beverage contains. °· Avoid caffeine. °Lifestyle °· Do not use any products that contain nicotine or tobacco, such as cigarettes   and e-cigarettes. If you need help quitting, ask your health care provider. °· Do not use alcohol or drugs. °· Avoid stress as much as possible. Rest and get plenty of sleep. °General instructions °· Take over-the-counter and prescription medicines only as told by your  health care provider. °· When lying down, lie on your side. This keeps pressure off of your baby. °· When sitting or lying down, raise (elevate) your feet. Try putting some pillows underneath your lower legs. °· Exercise regularly. Ask your health care provider what kinds of exercise are best for you. °· Keep all follow-up and prenatal visits as told by your health care provider. This is important. °How is this prevented? °To prevent preeclampsia or eclampsia from developing during another pregnancy: °· Get proper medical care during pregnancy. Your health care provider may be able to prevent preeclampsia or diagnose and treat it early. °· Your health care provider may have you take a low-dose aspirin or a calcium supplement during your next pregnancy. °· You may have tests of your blood pressure and kidney function after giving birth. °· Maintain a healthy weight. Ask your health care provider for help managing weight gain during pregnancy. °· Work with your health care provider to manage any long-term (chronic) health conditions you have, such as diabetes or kidney problems. ° °Contact a health care provider if: °· You gain more weight than expected. °· You have headaches. °· You have nausea or vomiting. °· You have abdominal pain. °· You feel dizzy or light-headed. °Get help right away if: °· You develop sudden or severe swelling anywhere in your body. This usually happens in the legs. °· You gain 5 lbs (2.3 kg) or more during one week. °· You have severe: °? Abdominal pain. °? Headaches. °? Dizziness. °? Vision problems. °? Confusion. °? Nausea or vomiting. °· You have a seizure. °· You have trouble moving any part of your body. °· You develop numbness in any part of your body. °· You have trouble speaking. °· You have any abnormal bleeding. °· You pass out. °This information is not intended to replace advice given to you by your health care provider. Make sure you discuss any questions you have with your health  care provider. °Document Released: 04/07/2000 Document Revised: 12/07/2015 Document Reviewed: 11/15/2015 °Elsevier Interactive Patient Education © 2018 [employer] ° °

## 2017-12-03 NOTE — Addendum Note (Signed)
Addended by: Brooke DareSICK, Lynea Rollison L on: 12/03/2017 03:36 PM   Modules accepted: Orders

## 2017-12-03 NOTE — Progress Notes (Signed)
PT presents today with concern about right upper quadrant pain and decreased movement. She had a headache yesterday she took Fioricet for, that improved. Today she has taken tylenol. Discussed the decreased sensatation of movement due to anterior placenta. Reflexes 2+, negative clonus, 1+ edema bilaterally. Pt states that the right upper quad pain is intermittent and does not feel any different from what she has been feeling. Reassurance given. Follow up 1 wk.   Doreene BurkeAnnie Chassidy Layson, CNM

## 2017-12-05 ENCOUNTER — Encounter: Payer: BLUE CROSS/BLUE SHIELD | Admitting: Certified Nurse Midwife

## 2017-12-12 ENCOUNTER — Encounter: Payer: Self-pay | Admitting: Certified Nurse Midwife

## 2017-12-12 ENCOUNTER — Ambulatory Visit (INDEPENDENT_AMBULATORY_CARE_PROVIDER_SITE_OTHER): Payer: BLUE CROSS/BLUE SHIELD | Admitting: Certified Nurse Midwife

## 2017-12-12 VITALS — BP 122/93 | HR 90 | Wt 218.6 lb

## 2017-12-12 DIAGNOSIS — Z3493 Encounter for supervision of normal pregnancy, unspecified, third trimester: Secondary | ICD-10-CM | POA: Diagnosis not present

## 2017-12-12 LAB — POCT URINALYSIS DIPSTICK OB
BILIRUBIN UA: NEGATIVE
Blood, UA: NEGATIVE
GLUCOSE, UA: NEGATIVE — AB
KETONES UA: NEGATIVE
Leukocytes, UA: NEGATIVE
Nitrite, UA: NEGATIVE
PH UA: 5 (ref 5.0–8.0)
Spec Grav, UA: 1.025 (ref 1.010–1.025)
UROBILINOGEN UA: NEGATIVE U/dL — AB

## 2017-12-12 NOTE — Patient Instructions (Signed)
Braxton Hicks Contractions °Contractions of the uterus can occur throughout pregnancy, but they are not always a sign that you are in labor. You may have practice contractions called Braxton Hicks contractions. These false labor contractions are sometimes confused with true labor. °What are Braxton Hicks contractions? °Braxton Hicks contractions are tightening movements that occur in the muscles of the uterus before labor. Unlike true labor contractions, these contractions do not result in opening (dilation) and thinning of the cervix. Toward the end of pregnancy (32-34 weeks), Braxton Hicks contractions can happen more often and may become stronger. These contractions are sometimes difficult to tell apart from true labor because they can be very uncomfortable. You should not feel embarrassed if you go to the hospital with false labor. °Sometimes, the only way to tell if you are in true labor is for your health care provider to look for changes in the cervix. The health care provider will do a physical exam and may monitor your contractions. If you are not in true labor, the exam should show that your cervix is not dilating and your water has not broken. °If there are other health problems associated with your pregnancy, it is completely safe for you to be sent home with false labor. You may continue to have Braxton Hicks contractions until you go into true labor. °How to tell the difference between true labor and false labor °True labor °· Contractions last 30-70 seconds. °· Contractions become very regular. °· Discomfort is usually felt in the top of the uterus, and it spreads to the lower abdomen and low back. °· Contractions do not go away with walking. °· Contractions usually become more intense and increase in frequency. °· The cervix dilates and gets thinner. °False labor °· Contractions are usually shorter and not as strong as true labor contractions. °· Contractions are usually irregular. °· Contractions  are often felt in the front of the lower abdomen and in the groin. °· Contractions may go away when you walk around or change positions while lying down. °· Contractions get weaker and are shorter-lasting as time goes on. °· The cervix usually does not dilate or become thin. °Follow these instructions at home: °· Take over-the-counter and prescription medicines only as told by your health care provider. °· Keep up with your usual exercises and follow other instructions from your health care provider. °· Eat and drink lightly if you think you are going into labor. °· If Braxton Hicks contractions are making you uncomfortable: °? Change your position from lying down or resting to walking, or change from walking to resting. °? Sit and rest in a tub of warm water. °? Drink enough fluid to keep your urine pale yellow. Dehydration may cause these contractions. °? Do slow and deep breathing several times an hour. °· Keep all follow-up prenatal visits as told by your health care provider. This is important. °Contact a health care provider if: °· You have a fever. °· You have continuous pain in your abdomen. °Get help right away if: °· Your contractions become stronger, more regular, and closer together. °· You have fluid leaking or gushing from your vagina. °· You pass blood-tinged mucus (bloody show). °· You have bleeding from your vagina. °· You have low back pain that you never had before. °· You feel your baby’s head pushing down and causing pelvic pressure. °· Your baby is not moving inside you as much as it used to. °Summary °· Contractions that occur before labor are called Braxton   Hicks contractions, false labor, or practice contractions. °· Braxton Hicks contractions are usually shorter, weaker, farther apart, and less regular than true labor contractions. True labor contractions usually become progressively stronger and regular and they become more frequent. °· Manage discomfort from Braxton Hicks contractions by  changing position, resting in a warm bath, drinking plenty of water, or practicing deep breathing. °This information is not intended to replace advice given to you by your health care provider. Make sure you discuss any questions you have with your health care provider. °Document Released: 08/24/2016 Document Revised: 08/24/2016 Document Reviewed: 08/24/2016 °Elsevier Interactive Patient Education © 2018 [employer] ° °

## 2017-12-12 NOTE — Progress Notes (Signed)
Pt is here for an ROB visit. C/o headache- denies blurred vision.

## 2017-12-12 NOTE — Progress Notes (Signed)
ROB doing well. Feels good movement. Denies contractions. BP today 122/93 repeat 126/88. She has a mild headache that came on after lunch she has not taken anything for it. Reflexes 2 + bilaterally, negative clonus. No visual disturbdances, no epigastric pain. Pt to encouraged to go take tylenol for her headache and to hydrate if headache does not go away or worsen she can go to hospital for evaluation. She verbalizes understanding and agrees to plan. Follow up in 1 wk   Angela BurkeAnnie Jayland Knox, CNM

## 2017-12-14 ENCOUNTER — Encounter: Payer: Self-pay | Admitting: Certified Nurse Midwife

## 2017-12-14 ENCOUNTER — Telehealth: Payer: Self-pay | Admitting: Certified Nurse Midwife

## 2017-12-14 ENCOUNTER — Other Ambulatory Visit: Payer: BLUE CROSS/BLUE SHIELD

## 2017-12-14 ENCOUNTER — Ambulatory Visit (INDEPENDENT_AMBULATORY_CARE_PROVIDER_SITE_OTHER): Payer: BLUE CROSS/BLUE SHIELD | Admitting: Certified Nurse Midwife

## 2017-12-14 ENCOUNTER — Other Ambulatory Visit: Payer: Self-pay | Admitting: Certified Nurse Midwife

## 2017-12-14 VITALS — BP 134/91 | HR 78

## 2017-12-14 DIAGNOSIS — O26893 Other specified pregnancy related conditions, third trimester: Secondary | ICD-10-CM | POA: Diagnosis not present

## 2017-12-14 DIAGNOSIS — Z3A38 38 weeks gestation of pregnancy: Secondary | ICD-10-CM

## 2017-12-14 DIAGNOSIS — R51 Headache: Secondary | ICD-10-CM | POA: Diagnosis not present

## 2017-12-14 DIAGNOSIS — R519 Headache, unspecified: Secondary | ICD-10-CM

## 2017-12-14 NOTE — Patient Instructions (Signed)
Nonstress Test The nonstress test is a procedure that monitors the fetus's heartbeat. The test will monitor the heartbeat when the fetus is at rest and while the fetus is moving. In a healthy fetus, there will be an increase in fetal heart rate when the fetus moves or kicks. The heart rate will decrease at rest. This test helps determine if the fetus is healthy. Your health care provider will look at a number of patterns in the heart rate tracing to make sure your baby is thriving. If there is concern, your health care provider may order additional tests or may suggest another course of action. This test is often done in the third trimester and can help determine if an early delivery is needed and safe. Common reasons to have this test are:  You are past your due date.  You have a high-risk pregnancy.  You are feeling less movement than normal.  You have lost a pregnancy in the past.  Your health care provider suspects fetal growth problems.  You have too much or too little amniotic fluid.  What happens before the procedure?  Eat a meal right before the test or as directed by your health care provider. Food may help stimulate fetal movements.  Use the restroom right before the test. What happens during the procedure?  Two belts will be placed around your abdomen. These belts have monitors attached to them. One records the fetal heart rate and the other records uterine contractions.  You may be asked to lie down on your side or to stay sitting upright.  You may be given a button to press when you feel movement.  The fetal heartbeat is listened to and watched on a screen. The heartbeat is recorded on a sheet of paper.  If the fetus seems to be sleeping, you may be asked to drink some juice or soda, gently press your abdomen, or make some noise to wake the fetus. What happens after the procedure? Your health care provider will discuss the test results with you and make recommendations  for the near future.  This information is not intended to replace advice given to you by your health care provider. Make sure you discuss any questions you have with your health care provider. This information is not intended to replace advice given to you by your health care provider. Make sure you discuss any questions you have with your health care provider. Document Released: 03/31/2002 Document Revised: 03/10/2016 Document Reviewed: 05/14/2012 Elsevier Interactive Patient Education  2018 Elsevier Inc[employer] Fetal Movement Counts Patient Name: ________________________________________________ Patient Due Date: ____________________ What is a fetal movement count? A fetal movement count is the number of times that you feel your baby move during a certain amount of time. This may also be called a fetal kick count. A fetal movement count is recommended for every pregnant woman. You may be asked to start counting fetal movements as early as week 28 of your pregnancy. Pay attention to when your baby is most active. You may notice your baby's sleep and wake cycles. You may also notice things that make your baby move more. You should do a fetal movement count:  When your baby is normally most active.  At the same time each day.  A good time to count movements is while you are resting, after having something to eat and drink. How do I count fetal movements? 1. Find a quiet, comfortable area. Sit, or lie down on your side. 2. Write down the   date, the start time and stop time, and the number of movements that you felt between those two times. Take this information with you to your health care visits. 3. For 2 hours, count kicks, flutters, swishes, rolls, and jabs. You should feel at least 10 movements during 2 hours. 4. You may stop counting after you have felt 10 movements. 5. If you do not feel 10 movements in 2 hours, have something to eat and drink. Then, keep resting and counting for 1 hour. If you feel  at least 4 movements during that hour, you may stop counting. Contact a health care provider if:  You feel fewer than 4 movements in 2 hours.  Your baby is not moving like he or she usually does. Date: ____________ Start time: ____________ Stop time: ____________ Movements: ____________ Date: ____________ Start time: ____________ Stop time: ____________ Movements: ____________ Date: ____________ Start time: ____________ Stop time: ____________ Movements: ____________ Date: ____________ Start time: ____________ Stop time: ____________ Movements: ____________ Date: ____________ Start time: ____________ Stop time: ____________ Movements: ____________ Date: ____________ Start time: ____________ Stop time: ____________ Movements: ____________ Date: ____________ Start time: ____________ Stop time: ____________ Movements: ____________ Date: ____________ Start time: ____________ Stop time: ____________ Movements: ____________ Date: ____________ Start time: ____________ Stop time: ____________ Movements: ____________ This information is not intended to replace advice given to you by your health care provider. Make sure you discuss any questions you have with your health care provider. Document Released: 05/10/2006 Document Revised: 12/08/2015 Document Reviewed: 05/20/2015 Elsevier Interactive Patient Education  2018 Elsevier Inc.  

## 2017-12-14 NOTE — Telephone Encounter (Signed)
Telephone call to patient, verified full name and date of birth. HIPPA approved message left on identified line instructing patient to contact CNM for lab results.    Angela Knox [name], CNM [facility], [facility]

## 2017-12-14 NOTE — Progress Notes (Signed)
ROB-Reports headache and lower extremity swelling. PIH labs collected, see orders. Will contact patient with results. NST performed today was reviewed and was found to be reactive. Baseline 125 bpm with moderate variability, accelerattions present and no decelerations noted. Reviewed red flag symptoms and when to call. RTC as previously scheduled or sooner if needed.

## 2017-12-15 LAB — COMPREHENSIVE METABOLIC PANEL
A/G RATIO: 1.6 (ref 1.2–2.2)
ALK PHOS: 185 IU/L — AB (ref 39–117)
ALT: 9 IU/L (ref 0–32)
AST: 10 IU/L (ref 0–40)
Albumin: 3.3 g/dL — ABNORMAL LOW (ref 3.5–5.5)
BUN/Creatinine Ratio: 10 (ref 9–23)
BUN: 7 mg/dL (ref 6–20)
CHLORIDE: 102 mmol/L (ref 96–106)
CO2: 19 mmol/L — ABNORMAL LOW (ref 20–29)
Calcium: 8.6 mg/dL — ABNORMAL LOW (ref 8.7–10.2)
Creatinine, Ser: 0.69 mg/dL (ref 0.57–1.00)
GFR calc Af Amer: 130 mL/min/{1.73_m2} (ref >59)
GFR calc non Af Amer: 113 mL/min/{1.73_m2} (ref >59)
GLUCOSE: 78 mg/dL (ref 65–99)
Globulin, Total: 2.1 g/dL (ref 1.5–4.5)
POTASSIUM: 3.9 mmol/L (ref 3.5–5.2)
Sodium: 134 mmol/L (ref 134–144)
Total Protein: 5.4 g/dL — ABNORMAL LOW (ref 6.0–8.5)

## 2017-12-15 LAB — CBC
HEMATOCRIT: 35.1 % (ref 34.0–46.6)
HEMOGLOBIN: 12 g/dL (ref 11.1–15.9)
MCH: 30.5 pg (ref 26.6–33.0)
MCHC: 34.2 g/dL (ref 31.5–35.7)
MCV: 89 fL (ref 79–97)
Platelets: 192 10*3/uL (ref 150–450)
RBC: 3.94 x10E6/uL (ref 3.77–5.28)
RDW: 14.1 % (ref 12.3–15.4)
WBC: 7.4 10*3/uL (ref 3.4–10.8)

## 2017-12-15 LAB — PROTEIN / CREATININE RATIO, URINE
CREATININE, UR: 242.9 mg/dL
PROTEIN UR: 70 mg/dL
PROTEIN/CREAT RATIO: 288 mg/g{creat} — AB (ref 0–200)

## 2017-12-16 ENCOUNTER — Other Ambulatory Visit: Payer: Self-pay

## 2017-12-16 ENCOUNTER — Observation Stay
Admission: EM | Admit: 2017-12-16 | Discharge: 2017-12-16 | Disposition: A | Discharge status: Home / Self Care | Payer: BLUE CROSS/BLUE SHIELD | Attending: Certified Nurse Midwife | Admitting: Certified Nurse Midwife

## 2017-12-16 DIAGNOSIS — Z886 Allergy status to analgesic agent status: Secondary | ICD-10-CM | POA: Diagnosis not present

## 2017-12-16 DIAGNOSIS — O4290 Premature rupture of membranes, unspecified as to length of time between rupture and onset of labor, unspecified weeks of gestation: Secondary | ICD-10-CM | POA: Diagnosis present

## 2017-12-16 DIAGNOSIS — Z3A38 38 weeks gestation of pregnancy: Secondary | ICD-10-CM | POA: Insufficient documentation

## 2017-12-16 DIAGNOSIS — O99343 Other mental disorders complicating pregnancy, third trimester: Secondary | ICD-10-CM | POA: Diagnosis not present

## 2017-12-16 DIAGNOSIS — F329 Major depressive disorder, single episode, unspecified: Secondary | ICD-10-CM | POA: Diagnosis not present

## 2017-12-16 DIAGNOSIS — Z79899 Other long term (current) drug therapy: Secondary | ICD-10-CM | POA: Diagnosis not present

## 2017-12-16 DIAGNOSIS — Z0371 Encounter for suspected problem with amniotic cavity and membrane ruled out: Principal | ICD-10-CM | POA: Insufficient documentation

## 2017-12-16 DIAGNOSIS — O479 False labor, unspecified: Secondary | ICD-10-CM | POA: Diagnosis present

## 2017-12-16 DIAGNOSIS — O4703 False labor before 37 completed weeks of gestation, third trimester: Secondary | ICD-10-CM

## 2017-12-16 LAB — ROM PLUS (ARMC ONLY): ROM PLUS: NEGATIVE

## 2017-12-16 NOTE — OB Triage Note (Signed)
Nitrazine Equivocal

## 2017-12-16 NOTE — Discharge Summary (Signed)
  Obstetric Discharge Summary  Patient ID: Angela PallJennifer Knox MRN: [id number] DOB/AGE: [birth year] 35 y.o.   Date of Admission: 12/16/2017  Date of Discharge: 12/16/17  Admitting Diagnosis: Observation at 5632w4d  Secondary Diagnosis: Depression     Discharge Diagnosis: No other diagnosis   Antepartum Procedures: NST and ROM Plus   Brief Hospital Course   L&D OB Triage Note  Angela PallJennifer Knox is a 37 y.o. 662P0010 female at 2532w4d, EDD Estimated Date of Delivery: 12/26/17 who presented to triage for complaints of contractions and leakage of fluid.  She was evaluated by the nurses with no significant findings for labor or fetal distress. Vital signs stable. An NST was performed and has been reviewed by Angela Knox.   NST INTERPRETATION:  Indications: rule out uterine contractions  Mode: External Baseline Rate (A): 115 bpm Variability: Moderate Accelerations: 15 x 15 Decelerations: None Contraction Frequency (min): 3-6  Impression: reactive  Dilation: 1.5 Effacement (%): 60 Cervical Position: Middle Station: -2 Presentation: Vertex Exam by:: Laural BenesB. Nielsen RN  ROM Plus: Negative  Plan: NST performed was reviewed and was found to be reactive. She was discharged home with bleeding/labor precautions.  Continue routine prenatal care. Follow up with OB/GYN as previously scheduled.   Discharge Instructions: Per After Visit Summary.  Activity:  Also refer to After Visit Summary.  Diet: Regular  Medications:  Allergies as of 12/16/2017      Reactions   Sertraline Hcl Itching   Asa [aspirin] Itching, Rash      Medication List    STOP taking these medications   acetaminophen 500 MG tablet Commonly known as:  TYLENOL     TAKE these medications   Butalbital-APAP-Caffeine 50-300-40 MG Caps TAKE 1-2 CAPSULES BY MOUTH EVERY 6 HOURS AS NEEDED FOR HEADACHE, MAX 6 CAPS / DAY   CONCEPT DHA 53.5-38-1 MG Caps Take 1 capsule daily by mouth.   escitalopram 10 MG tablet Commonly known as:   LEXAPRO Take 1 tablet (10 mg total) by mouth daily.   Iron 325 (65 Fe) MG Tabs Take 1 tablet (325 mg total) by mouth daily.      Outpatient follow up:  Follow-up Information    [place], Melody N, Angela Knox Follow up.   Specialties:  Obstetrics and Gynecology, Radiology Why:  As previously scheduled Contact information: [address] [place] [id number] [phone]           Discharged Condition: stable  Discharged to: home   Angela Knox, Angela Knox [facility], [facility] 12/16/17 5:48 AM

## 2017-12-16 NOTE — OB Triage Note (Signed)
Pt presents c/o of LOF since right before midnight. Pt has had some abdominal cramping this evening as well. Pt denies any bleeding or pain. Reports positive fetal movement. Reports last intercourse of last night. Bp elevated. Bps cycling q15 mins. No clonus, +2 reflexes. Denies epigastric pain. Other vitals WNL. Will continue to monitor.

## 2017-12-20 ENCOUNTER — Ambulatory Visit (INDEPENDENT_AMBULATORY_CARE_PROVIDER_SITE_OTHER): Payer: BLUE CROSS/BLUE SHIELD | Admitting: Obstetrics and Gynecology

## 2017-12-20 VITALS — BP 139/91 | HR 75 | Wt 221.0 lb

## 2017-12-20 DIAGNOSIS — Z3493 Encounter for supervision of normal pregnancy, unspecified, third trimester: Secondary | ICD-10-CM

## 2017-12-20 LAB — POCT URINALYSIS DIPSTICK OB
BILIRUBIN UA: NEGATIVE
GLUCOSE, UA: NEGATIVE
Ketones, UA: NEGATIVE
LEUKOCYTES UA: NEGATIVE
Nitrite, UA: NEGATIVE
RBC UA: NEGATIVE
Spec Grav, UA: 1.015 (ref 1.010–1.025)
Urobilinogen, UA: 0.2 E.U./dL
pH, UA: 6 (ref 5.0–8.0)

## 2017-12-20 NOTE — Progress Notes (Signed)
ROB- discussed plan IOL due to gestational hypertension. NTZ & Crist FatFern  negative reassured.

## 2017-12-20 NOTE — Progress Notes (Signed)
ROB- pt is having some pelvic pressure, denies a headache today

## 2017-12-21 ENCOUNTER — Other Ambulatory Visit: Payer: Self-pay | Admitting: Certified Nurse Midwife

## 2017-12-22 DIAGNOSIS — F329 Major depressive disorder, single episode, unspecified: Secondary | ICD-10-CM | POA: Diagnosis present

## 2017-12-22 DIAGNOSIS — Z3A39 39 weeks gestation of pregnancy: Secondary | ICD-10-CM

## 2017-12-22 DIAGNOSIS — O99344 Other mental disorders complicating childbirth: Secondary | ICD-10-CM | POA: Diagnosis present

## 2017-12-22 DIAGNOSIS — O134 Gestational [pregnancy-induced] hypertension without significant proteinuria, complicating childbirth: Principal | ICD-10-CM | POA: Diagnosis present

## 2017-12-23 ENCOUNTER — Inpatient Hospital Stay: Payer: BLUE CROSS/BLUE SHIELD | Admitting: Anesthesiology

## 2017-12-23 ENCOUNTER — Encounter: Payer: Self-pay | Admitting: Obstetrics and Gynecology

## 2017-12-23 ENCOUNTER — Inpatient Hospital Stay
Admission: EM | Admit: 2017-12-23 | Discharge: 2017-12-25 | DRG: 807 | Disposition: A | Discharge status: Home / Self Care | Payer: BLUE CROSS/BLUE SHIELD | Attending: Certified Nurse Midwife | Admitting: Certified Nurse Midwife

## 2017-12-23 ENCOUNTER — Other Ambulatory Visit: Payer: Self-pay

## 2017-12-23 DIAGNOSIS — F329 Major depressive disorder, single episode, unspecified: Secondary | ICD-10-CM | POA: Diagnosis present

## 2017-12-23 DIAGNOSIS — O99344 Other mental disorders complicating childbirth: Secondary | ICD-10-CM | POA: Diagnosis present

## 2017-12-23 DIAGNOSIS — Z3A39 39 weeks gestation of pregnancy: Secondary | ICD-10-CM | POA: Diagnosis not present

## 2017-12-23 DIAGNOSIS — O134 Gestational [pregnancy-induced] hypertension without significant proteinuria, complicating childbirth: Secondary | ICD-10-CM | POA: Diagnosis present

## 2017-12-23 LAB — COMPREHENSIVE METABOLIC PANEL
ALT: 15 U/L (ref 0–44)
AST: 24 U/L (ref 15–41)
Albumin: 2.8 g/dL — ABNORMAL LOW (ref 3.5–5.0)
Alkaline Phosphatase: 187 U/L — ABNORMAL HIGH (ref 38–126)
Anion gap: 7 (ref 5–15)
BUN: 9 mg/dL (ref 6–20)
CHLORIDE: 106 mmol/L (ref 98–111)
CO2: 21 mmol/L — ABNORMAL LOW (ref 22–32)
CREATININE: 0.75 mg/dL (ref 0.44–1.00)
Calcium: 8.2 mg/dL — ABNORMAL LOW (ref 8.9–10.3)
Glucose, Bld: 105 mg/dL — ABNORMAL HIGH (ref 70–99)
POTASSIUM: 3.6 mmol/L (ref 3.5–5.1)
Sodium: 134 mmol/L — ABNORMAL LOW (ref 135–145)
Total Bilirubin: 0.2 mg/dL — ABNORMAL LOW (ref 0.3–1.2)
Total Protein: 5.9 g/dL — ABNORMAL LOW (ref 6.5–8.1)

## 2017-12-23 LAB — CBC
HCT: 33.7 % — ABNORMAL LOW (ref 35.0–47.0)
Hemoglobin: 12.2 g/dL (ref 12.0–16.0)
MCH: 31.6 pg (ref 26.0–34.0)
MCHC: 36.1 g/dL — AB (ref 32.0–36.0)
MCV: 87.5 fL (ref 80.0–100.0)
PLATELETS: 145 10*3/uL — AB (ref 150–440)
RBC: 3.85 MIL/uL (ref 3.80–5.20)
RDW: 13.6 % (ref 11.5–14.5)
WBC: 7.9 10*3/uL (ref 3.6–11.0)

## 2017-12-23 LAB — TYPE AND SCREEN
ABO/RH(D): O POS
ANTIBODY SCREEN: NEGATIVE

## 2017-12-23 LAB — PROTEIN / CREATININE RATIO, URINE
CREATININE, URINE: 39 mg/dL
Protein Creatinine Ratio: 0.49 mg/mg{Cre} — ABNORMAL HIGH (ref 0.00–0.15)
TOTAL PROTEIN, URINE: 19 mg/dL

## 2017-12-23 MED ORDER — SODIUM CHLORIDE 0.9 % IJ SOLN
INTRAMUSCULAR | Status: AC
  Filled 2017-12-23: qty 50

## 2017-12-23 MED ORDER — MISOPROSTOL 25 MCG QUARTER TABLET
50.0000 ug | ORAL_TABLET | ORAL | Status: DC
  Administered 2017-12-23 (×2): 50 ug via VAGINAL
  Filled 2017-12-23 (×2): qty 1

## 2017-12-23 MED ORDER — OXYTOCIN 10 UNIT/ML IJ SOLN: 10.0000 [IU] | Freq: Once | INTRAMUSCULAR | Status: DC

## 2017-12-23 MED ORDER — TERBUTALINE SULFATE 1 MG/ML IJ SOLN: 0.2500 mg | Freq: Once | INTRAMUSCULAR | Status: DC | PRN

## 2017-12-23 MED ORDER — ONDANSETRON HCL 4 MG/2ML IJ SOLN: 4.0000 mg | Freq: Four times a day (QID) | INTRAMUSCULAR | Status: DC | PRN

## 2017-12-23 MED ORDER — LIDOCAINE HCL (PF) 1 % IJ SOLN
INTRAMUSCULAR | Status: DC | PRN
  Administered 2017-12-23: 2 mL

## 2017-12-23 MED ORDER — LACTATED RINGERS IV SOLN
INTRAVENOUS | Status: DC
  Administered 2017-12-23: 01:00:00 via INTRAVENOUS

## 2017-12-23 MED ORDER — OXYTOCIN 40 UNITS IN LACTATED RINGERS INFUSION - SIMPLE MED
2.5000 [IU]/h | INTRAVENOUS | Status: DC
  Filled 2017-12-23: qty 1000

## 2017-12-23 MED ORDER — AMMONIA AROMATIC IN INHA
RESPIRATORY_TRACT | Status: AC
  Filled 2017-12-23: qty 10

## 2017-12-23 MED ORDER — SODIUM CHLORIDE 0.9 % IV SOLN
INTRAVENOUS | Status: DC | PRN
  Administered 2017-12-23 (×3): 5 mL via EPIDURAL

## 2017-12-23 MED ORDER — BENZOCAINE-MENTHOL 20-0.5 % EX AERO
1.0000 "application " | INHALATION_SPRAY | CUTANEOUS | Status: DC | PRN
  Filled 2017-12-23: qty 56

## 2017-12-23 MED ORDER — METHYLERGONOVINE MALEATE 0.2 MG/ML IJ SOLN
INTRAMUSCULAR | Status: AC
  Administered 2017-12-23: 0.2 mg
  Filled 2017-12-23: qty 1

## 2017-12-23 MED ORDER — LACTATED RINGERS IV SOLN: 500.0000 mL | Freq: Once | INTRAVENOUS | Status: DC

## 2017-12-23 MED ORDER — LIDOCAINE HCL (PF) 1 % IJ SOLN
INTRAMUSCULAR | Status: AC
  Administered 2017-12-23: 30 mL via SUBCUTANEOUS
  Filled 2017-12-23: qty 30

## 2017-12-23 MED ORDER — OXYTOCIN 10 UNIT/ML IJ SOLN
INTRAMUSCULAR | Status: AC
  Filled 2017-12-23: qty 2

## 2017-12-23 MED ORDER — ACETAMINOPHEN 325 MG PO TABS: 650.0000 mg | ORAL_TABLET | ORAL | Status: DC | PRN

## 2017-12-23 MED ORDER — FENTANYL 2.5 MCG/ML W/ROPIVACAINE 0.15% IN NS 100 ML EPIDURAL (ARMC)
EPIDURAL | Status: AC
  Filled 2017-12-23: qty 100

## 2017-12-23 MED ORDER — EPHEDRINE 5 MG/ML INJ
10.0000 mg | INTRAVENOUS | Status: DC | PRN
  Filled 2017-12-23: qty 2

## 2017-12-23 MED ORDER — OXYTOCIN BOLUS FROM INFUSION
500.0000 mL | Freq: Once | INTRAVENOUS | Status: AC
  Administered 2017-12-23: 500 mL via INTRAVENOUS

## 2017-12-23 MED ORDER — PHENYLEPHRINE 40 MCG/ML (10ML) SYRINGE FOR IV PUSH (FOR BLOOD PRESSURE SUPPORT)
80.0000 ug | PREFILLED_SYRINGE | INTRAVENOUS | Status: DC | PRN
  Filled 2017-12-23: qty 5

## 2017-12-23 MED ORDER — IBUPROFEN 600 MG PO TABS
600.0000 mg | ORAL_TABLET | Freq: Four times a day (QID) | ORAL | Status: DC
  Administered 2017-12-23 – 2017-12-25 (×5): 600 mg via ORAL
  Filled 2017-12-23 (×5): qty 1

## 2017-12-23 MED ORDER — SOD CITRATE-CITRIC ACID 500-334 MG/5ML PO SOLN: 30.0000 mL | ORAL | Status: DC | PRN

## 2017-12-23 MED ORDER — LIDOCAINE HCL (PF) 1 % IJ SOLN
30.0000 mL | INTRAMUSCULAR | Status: DC | PRN
  Administered 2017-12-23: 30 mL via SUBCUTANEOUS

## 2017-12-23 MED ORDER — FENTANYL 2.5 MCG/ML W/ROPIVACAINE 0.15% IN NS 100 ML EPIDURAL (ARMC)
12.0000 mL/h | EPIDURAL | Status: DC
  Administered 2017-12-23: 12 mL/h via EPIDURAL
  Filled 2017-12-23: qty 100

## 2017-12-23 MED ORDER — LACTATED RINGERS IV SOLN: 500.0000 mL | INTRAVENOUS | Status: DC | PRN

## 2017-12-23 MED ORDER — MISOPROSTOL 200 MCG PO TABS
ORAL_TABLET | ORAL | Status: AC
  Administered 2017-12-23: 50 ug via VAGINAL
  Filled 2017-12-23: qty 4

## 2017-12-23 MED ORDER — OXYTOCIN 40 UNITS IN LACTATED RINGERS INFUSION - SIMPLE MED
1.0000 m[IU]/min | INTRAVENOUS | Status: DC
  Administered 2017-12-23: 2 m[IU]/min via INTRAVENOUS

## 2017-12-23 MED ORDER — DIPHENHYDRAMINE HCL 50 MG/ML IJ SOLN: 12.5000 mg | INTRAMUSCULAR | Status: DC | PRN

## 2017-12-23 MED ORDER — BUTORPHANOL TARTRATE 1 MG/ML IJ SOLN: 1.0000 mg | INTRAMUSCULAR | Status: DC | PRN

## 2017-12-23 NOTE — Anesthesia Procedure Notes (Signed)
Epidural Patient location during procedure: OB Start time: 12/23/2017 9:40 AM End time: 12/23/2017 9:55 AM  Staffing Anesthesiologist: Jovita Gamma, MD Performed: anesthesiologist   Preanesthetic Checklist Completed: patient identified, site marked, surgical consent, pre-op evaluation, timeout performed, IV checked, risks and benefits discussed and monitors and equipment checked  Epidural Patient position: sitting Prep: ChloraPrep and site prepped and draped Patient monitoring: heart rate, continuous pulse ox and blood pressure Approach: midline Location: L3-L4 Injection technique: LOR saline  Needle:  Needle type: Tuohy  Needle gauge: 18 G Needle length: 9 cm and 9 Needle insertion depth: 6 cm Catheter type: closed end flexible Catheter size: 20 Guage Catheter at skin depth: 11 cm Test dose: negative and Other  Assessment Events: blood not aspirated, injection not painful, no injection resistance, negative IV test and no paresthesia  Additional Notes   Patient tolerated the insertion well without complications.Reason for block:procedure for pain

## 2017-12-23 NOTE — Progress Notes (Signed)
LABOR NOTE   Angela Knox 37 y.o.@ at [redacted]w[redacted]d Early latent labor.  SUBJECTIVE:  Comfortable with epidural  OBJECTIVE:  BP (!) 155/94   Pulse 68   Temp 98.3 F (36.8 C) (Oral)   Resp 16   Ht 5\' 5"  (1.651 m)   Wt 100.2 kg   LMP 03/21/2017 (Exact Date)   SpO2 99%   BMI 36.76 kg/m  No intake/output data recorded.  She has shown cervical change. CERVIX: 4cm:  90%:   -2:   mid position:   firm SVE:   Dilation: 3 Effacement (%): 80 Station: -2 Exam by:: HWC CONTRACTIONS: regular, every 1-3 minutes FHR: Fetal heart tracing reviewed. Baseline: 130 bpm, Variability: Good {> 6 bpm), Accelerations: Reactive and Decelerations: Absent Category I   Analgesia: Epidural  Labs: Lab Results  Component Value Date   WBC 7.9 12/23/2017   HGB 12.2 12/23/2017   HCT 33.7 (L) 12/23/2017   MCV 87.5 12/23/2017   PLT 145 (L) 12/23/2017    ASSESSMENT: 1) Labor curve reviewed.       Progress: Early latent labor.     Membranes: ruptured     IUPC placed       Active Problems:   Labor and delivery, indication for care Gestational Hypertension Depression  PLAN: IV Pitocin augmentation and placed IUPC. Titrate pitocin for adequate MVU's 200-250  [name], [place]  12/23/2017 12:09 PM

## 2017-12-23 NOTE — Plan of Care (Signed)
Plan of care reviewed with pt. All questions answered. Will monitor closely.

## 2017-12-23 NOTE — H&P (Signed)
History and Physical   HPI  Angela Knox is a 37 y.o. G2P0010 at [redacted]w[redacted]d Estimated Date of Delivery: 12/26/17 who is being admitted for induction of labor due to gestational hypertension.   OB History  OB History  Gravida Para Term Preterm AB Living  2 0 0 0 1 0  SAB TAB Ectopic Multiple Live Births  1 0 0 0 0    # Outcome Date GA Lbr Len/2nd Weight Sex Delivery Anes PTL Lv  2 Current           1 SAB 2018 [redacted]w[redacted]d           PROBLEM LIST  Pregnancy complications or risks: Patient Active Problem List   Diagnosis Date Noted  . Labor and delivery, indication for care 12/23/2017  . Indication for care in labor or delivery 12/16/2017  . Other malaise and fatigue 09/05/2012  . Depression   . Insomnia     Prenatal labs and studies: ABO, Rh: --/--/O POS (09/01 0038) Antibody: NEG (09/01 0038) Rubella: 6.13 (02/01 0956) RPR: Non Reactive (06/12 1044)  HBsAg: Negative (02/01 0956)  HIV: Non Reactive (02/01 0956)  JOA:CZYSAYTK (08/08 1601)   Past Medical History:  Diagnosis Date  . Depression   . Insomnia      Past Surgical History:  Procedure Laterality Date  . APPENDECTOMY       Medications    Current Discharge Medication List    CONTINUE these medications which have NOT CHANGED   Details  escitalopram (LEXAPRO) 10 MG tablet Take 1 tablet (10 mg total) by mouth daily. Qty: 30 tablet, Refills: 6    Ferrous Sulfate (IRON) 325 (65 Fe) MG TABS Take 1 tablet (325 mg total) by mouth daily. Qty: 30 each, Refills: 6    Prenat-FeFum-FePo-FA-Omega 3 (CONCEPT DHA) 53.5-38-1 MG CAPS Take 1 capsule daily by mouth. Qty: 30 capsule, Refills: 12    Butalbital-APAP-Caffeine 50-300-40 MG CAPS TAKE 1-2 CAPSULES BY MOUTH EVERY 6 HOURS AS NEEDED FOR HEADACHE, MAX 6 CAPS / DAY Refills: 0         Allergies  Sertraline hcl and Asa [aspirin]  Review of Systems  Constitutional: negative Eyes: negative Ears, nose, mouth, throat, and face: negative Respiratory:  negative Cardiovascular: negative Gastrointestinal: negative Genitourinary:negative Integument/breast: negative Hematologic/lymphatic: negative Musculoskeletal:negative Neurological: negative Behavioral/Psych: negative Endocrine: negative Allergic/Immunologic: negative  Physical Exam  BP (!) 153/91   Pulse 75   Temp 98.3 F (36.8 C) (Oral)   Resp 16   Ht 5\' 5"  (1.651 m)   Wt 100.2 kg   LMP 03/21/2017 (Exact Date)   BMI 36.76 kg/m   Lungs:  CTA B Cardio: RRR  Abd: Soft, gravid, NT Presentation: cephalic EXT: No C/C/ 1+ Edema DTRs: 2+ B Negative clonus CERVIX: Dilation: 3 Effacement (%): 70 Cervical Position: Posterior Station: -2 Presentation: Vertex Exam by:: AT CNM    See Prenatal records for more detailed PE.     FHR:  Baseline: 130 bpm, Variability: Good {> 6 bpm), Accelerations: Reactive and Decelerations: Absent  Toco: Uterine Contractions: Frequency: Every 2-3 minutes, Duration: 60-90 seconds and Intensity: mild  Test Results  Results for orders placed or performed during the hospital encounter of 12/23/17 (from the past 24 hour(s))  CBC     Status: Abnormal   Collection Time: 12/23/17 12:38 AM  Result Value Ref Range   WBC 7.9 3.6 - 11.0 K/uL   RBC 3.85 3.80 - 5.20 MIL/uL   Hemoglobin 12.2 12.0 - 16.0 g/dL   HCT  33.7 (L) 35.0 - 47.0 %   MCV 87.5 80.0 - 100.0 fL   MCH 31.6 26.0 - 34.0 pg   MCHC 36.1 (H) 32.0 - 36.0 g/dL   RDW 16.1 09.6 - 04.5 %   Platelets 145 (L) 150 - 440 K/uL  Comprehensive metabolic panel     Status: Abnormal   Collection Time: 12/23/17 12:38 AM  Result Value Ref Range   Sodium 134 (L) 135 - 145 mmol/L   Potassium 3.6 3.5 - 5.1 mmol/L   Chloride 106 98 - 111 mmol/L   CO2 21 (L) 22 - 32 mmol/L   Glucose, Bld 105 (H) 70 - 99 mg/dL   BUN 9 6 - 20 mg/dL   Creatinine, Ser 4.09 0.44 - 1.00 mg/dL   Calcium 8.2 (L) 8.9 - 10.3 mg/dL   Total Protein 5.9 (L) 6.5 - 8.1 g/dL   Albumin 2.8 (L) 3.5 - 5.0 g/dL   AST 24 15 - 41  U/L   ALT 15 0 - 44 U/L   Alkaline Phosphatase 187 (H) 38 - 126 U/L   Total Bilirubin 0.2 (L) 0.3 - 1.2 mg/dL   GFR calc non Af Amer >60 >60 mL/min   GFR calc Af Amer >60 >60 mL/min   Anion gap 7 5 - 15  Protein / creatinine ratio, urine     Status: Abnormal   Collection Time: 12/23/17 12:38 AM  Result Value Ref Range   Creatinine, Urine 39 mg/dL   Total Protein, Urine 19 mg/dL   Protein Creatinine Ratio 0.49 (H) 0.00 - 0.15 mg/mg[Cre]  Type and screen     Status: None   Collection Time: 12/23/17 12:38 AM  Result Value Ref Range   ABO/RH(D) O POS    Antibody Screen NEG    Sample Expiration      12/26/2017 Performed at [facility], [address] [place], [place] [address]    Group B Strep negative  Assessment   G2P0010 at [redacted]w[redacted]d Estimated Date of Delivery: 12/26/17  The fetus is reassuring.    Patient Active Problem List   Diagnosis Date Noted  . Labor and delivery, indication for care 12/23/2017  . Indication for care in labor or delivery 12/16/2017  . Other malaise and fatigue 09/05/2012  . Depression   . Insomnia     Plan  1. Admit to L&D : Cytotec placed x 2 doses.  AROM-bloody fluid 2. EFM:-- Category 1 3. Epidural if desired.  Stadol for IV pain until epidural requested. 4. Admission labs completed 5. Anticipate NSVD  [name], CNM  12/23/2017 8:06 AM

## 2017-12-23 NOTE — Progress Notes (Signed)
LABOR NOTE   Angela Knox 37 y.o.@ at [redacted]w[redacted]d Active phase labor.  SUBJECTIVE:  Comfortable with epidural . Denies any pressure OBJECTIVE:  BP (!) 155/94   Pulse 68   Temp 98.3 F (36.8 C) (Oral)   Resp 16   Ht 5\' 5"  (1.651 m)   Wt 100.2 kg   LMP 03/21/2017 (Exact Date)   SpO2 99%   BMI 36.76 kg/m  No intake/output data recorded.  She has shown cervical change. CERVIX: 9cm:  100%:   -2:   mid position:   soft SVE:   Dilation: 9 Effacement (%): 100 Station: -2 Exam by:: AT CNM  CONTRACTIONS: regular, every 1-3 minutes FHR: Fetal heart tracing reviewed. Baseline: 120 bpm, Variability: Good {> 6 bpm), Accelerations: Non-reactive but appropriate for gestational age and Decelerations: early and variable Category II  Analgesia: Epidural  Labs: Lab Results  Component Value Date   WBC 7.9 12/23/2017   HGB 12.2 12/23/2017   HCT 33.7 (L) 12/23/2017   MCV 87.5 12/23/2017   PLT 145 (L) 12/23/2017    ASSESSMENT: 1) Labor curve reviewed.       Progress: Active phase labor.     Membranes: ruptured, clear fluid     IUPC       Active Problems:   Labor and delivery, indication for care Gestational Hypertension Depression  PLAN: continue present management.    [name], CNM  12/23/2017 4:40 PM

## 2017-12-23 NOTE — Anesthesia Preprocedure Evaluation (Addendum)
Anesthesia Evaluation  Patient identified by MRN, date of birth, ID band Patient awake    Reviewed: Allergy & Precautions, H&P , NPO status , Patient's Chart, lab work & pertinent test results  Airway Mallampati: III  TM Distance: >3 FB Neck ROM: full    Dental no notable dental hx. (+) Teeth Intact   Pulmonary neg pulmonary ROS,           Cardiovascular Exercise Tolerance: Good hypertension, negative cardio ROS       Neuro/Psych    GI/Hepatic negative GI ROS,   Endo/Other    Renal/GU   negative genitourinary   Musculoskeletal   Abdominal   Peds  Hematology negative hematology ROS (+)   Anesthesia Other Findings Past Medical History: No date: Depression No date: Insomnia  Past Surgical History: No date: APPENDECTOMY  BMI    Body Mass Index:  36.76 kg/m      Reproductive/Obstetrics (+) Pregnancy                            Anesthesia Physical Anesthesia Plan  ASA: III  Anesthesia Plan: Epidural   Post-op Pain Management:    Induction:   PONV Risk Score and Plan:   Airway Management Planned:   Additional Equipment:   Intra-op Plan:   Post-operative Plan:   Informed Consent: I have reviewed the patients History and Physical, chart, labs and discussed the procedure including the risks, benefits and alternatives for the proposed anesthesia with the patient or authorized representative who has indicated his/her understanding and acceptance.     Plan Discussed with: Anesthesiologist  Anesthesia Plan Comments:        Anesthesia Quick Evaluation

## 2017-12-23 NOTE — Progress Notes (Signed)
LABOR NOTE   Angela Knox 37 y.o.@ at [redacted]w[redacted]d Early latent labor.  SUBJECTIVE:  Comfortable with epidural. Denies feeling any pressure at this time.  OBJECTIVE:  BP (!) 155/94   Pulse 68   Temp 98.3 F (36.8 C) (Oral)   Resp 16   Ht 5\' 5"  (1.651 m)   Wt 100.2 kg   LMP 03/21/2017 (Exact Date)   SpO2 99%   BMI 36.76 kg/m  No intake/output data recorded.   CERVIX: Exam deferred SVE:   Dilation: 3 Effacement (%): 80 Station: -2 Exam by:: HWC CONTRACTIONS: regular, every 1-3 minutes FHR: Fetal heart tracing reviewed. Baseline: 135 bpm, Variability: Good {> 6 bpm), Accelerations: Reactive and Decelerations: Absent Category I   Analgesia: Epidural  Labs: Lab Results  Component Value Date   WBC 7.9 12/23/2017   HGB 12.2 12/23/2017   HCT 33.7 (L) 12/23/2017   MCV 87.5 12/23/2017   PLT 145 (L) 12/23/2017    ASSESSMENT: 1) Labor curve reviewed.       Progress: Early latent labor.     Membranes: ruptured, clear fluid     IUPC      Active Problems:   Labor and delivery, indication for care Gestational Hypertension Depression  PLAN: continue present management   [name], CNM  12/23/2017 2:25 PM

## 2017-12-23 NOTE — Anesthesia Post-op Follow-up Note (Signed)
Anesthesia QCDR form completed.        

## 2017-12-24 LAB — RPR: RPR Ser Ql: NONREACTIVE

## 2017-12-24 LAB — CBC
HCT: 29.6 % — ABNORMAL LOW (ref 35.0–47.0)
HEMOGLOBIN: 10.2 g/dL — AB (ref 12.0–16.0)
MCH: 30.8 pg (ref 26.0–34.0)
MCHC: 34.6 g/dL (ref 32.0–36.0)
MCV: 89.1 fL (ref 80.0–100.0)
Platelets: 133 10*3/uL — ABNORMAL LOW (ref 150–440)
RBC: 3.33 MIL/uL — ABNORMAL LOW (ref 3.80–5.20)
RDW: 13.6 % (ref 11.5–14.5)
WBC: 14.5 10*3/uL — ABNORMAL HIGH (ref 3.6–11.0)

## 2017-12-24 MED ORDER — OXYCODONE-ACETAMINOPHEN 5-325 MG PO TABS: 2.0000 | ORAL_TABLET | ORAL | Status: DC | PRN

## 2017-12-24 MED ORDER — OXYCODONE-ACETAMINOPHEN 5-325 MG PO TABS: 1.0000 | ORAL_TABLET | ORAL | Status: DC | PRN

## 2017-12-24 MED ORDER — COCONUT OIL OIL: 1.0000 "application " | TOPICAL_OIL | Status: DC | PRN

## 2017-12-24 MED ORDER — FERROUS SULFATE 325 (65 FE) MG PO TABS
325.0000 mg | ORAL_TABLET | Freq: Every day | ORAL | Status: DC
  Administered 2017-12-24 – 2017-12-25 (×2): 325 mg via ORAL
  Filled 2017-12-24 (×2): qty 1

## 2017-12-24 MED ORDER — DIBUCAINE 1 % RE OINT: 1.0000 "application " | TOPICAL_OINTMENT | RECTAL | Status: DC | PRN

## 2017-12-24 MED ORDER — ONDANSETRON HCL 4 MG/2ML IJ SOLN: 4.0000 mg | INTRAMUSCULAR | Status: DC | PRN

## 2017-12-24 MED ORDER — WITCH HAZEL-GLYCERIN EX PADS: 1.0000 "application " | MEDICATED_PAD | CUTANEOUS | Status: DC | PRN

## 2017-12-24 MED ORDER — ESCITALOPRAM OXALATE 10 MG PO TABS
10.0000 mg | ORAL_TABLET | Freq: Every day | ORAL | Status: DC
  Administered 2017-12-24: 10 mg via ORAL
  Filled 2017-12-24: qty 1

## 2017-12-24 MED ORDER — SIMETHICONE 80 MG PO CHEW: 80.0000 mg | CHEWABLE_TABLET | ORAL | Status: DC | PRN

## 2017-12-24 MED ORDER — ONDANSETRON HCL 4 MG PO TABS: 4.0000 mg | ORAL_TABLET | ORAL | Status: DC | PRN

## 2017-12-24 MED ORDER — SENNOSIDES-DOCUSATE SODIUM 8.6-50 MG PO TABS: 2.0000 | ORAL_TABLET | ORAL | Status: DC

## 2017-12-24 MED ORDER — DOCUSATE SODIUM 100 MG PO CAPS
100.0000 mg | ORAL_CAPSULE | Freq: Two times a day (BID) | ORAL | Status: DC
  Administered 2017-12-24 – 2017-12-25 (×3): 100 mg via ORAL
  Filled 2017-12-24 (×3): qty 1

## 2017-12-24 MED ORDER — PRENATAL MULTIVITAMIN CH
1.0000 | ORAL_TABLET | Freq: Every day | ORAL | Status: DC
  Administered 2017-12-24: 1 via ORAL
  Filled 2017-12-24: qty 1

## 2017-12-24 MED ORDER — ACETAMINOPHEN 325 MG PO TABS: 650.0000 mg | ORAL_TABLET | ORAL | Status: DC | PRN

## 2017-12-24 NOTE — Progress Notes (Signed)
Patient ID: Angela Knox, female   DOB: [birth year], 37 y.o.   MRN: [id number]  Post Partum Day # 1, s/p SVD  Subjective:  Patient sitting in bed, no questions or concerns. Family members at bedside holding infant.   Denies difficulty breathing or respiratory distress, chest pain, abdominal pain, excessive vaginal bleeding, dysuria, and leg pain.   Objective:  Temp:  [97.3 F (36.3 C)-98.5 F (36.9 C)] 97.8 F (36.6 C) (09/02 0757) Pulse Rate:  [84-134] 90 (09/02 0757) Resp:  [16-18] 18 (09/02 0757) BP: (115-147)/(74-103) 125/92 (09/02 0757) SpO2:  [99 %] 99 % (09/02 0757)  Physical Exam:   General: alert and cooperative   Lungs: clear to auscultation bilaterally  Breasts: deferred, no complaints  Heart: normal apical impulse  Abdomen: soft, non-tender; bowel sounds normal; no masses,  no organomegaly  Pelvis: Lochia: appropriate, Uterine Fundus: firm  Extremities: DVT Evaluation: No evidence of DVT seen on physical exam.  Recent Labs    12/23/17 0038 12/24/17 0601  HGB 12.2 10.2*  HCT 33.7* 29.6*    Assessment:  37 year old G2P1 postpartum day #1, status post vaginal birth with repaired second degree perineal and clitoral lacerations, Rh positive, Gestational hypertension  Formula feeding  Plan:  Routine postpartum care and education.   Reviewed red flag symptoms and when to call.   Continue orders as written. Reassess as needed.   Plan for discharge tomorrow   LOS: 1 day    Gunnar Bulla, CNM [facility], [facility] 12/24/2017 11:27 AM

## 2017-12-24 NOTE — Addendum Note (Signed)
Addendum  created 12/24/17 0829 by Theone Bowell, CRNA   Intraprocedure Staff edited    

## 2017-12-24 NOTE — Anesthesia Postprocedure Evaluation (Signed)
Anesthesia Post Note  Patient: Angela Knox  Procedure(s) Performed: AN AD HOC LABOR EPIDURAL  Patient location during evaluation: Mother Baby Anesthesia Type: Epidural Level of consciousness: awake and alert and oriented Pain management: pain level controlled Vital Signs Assessment: post-procedure vital signs reviewed and stable Respiratory status: spontaneous breathing Cardiovascular status: stable Postop Assessment: no backache, patient able to bend at knees, no apparent nausea or vomiting, adequate PO intake and able to ambulate Anesthetic complications: no     Last Vitals:  Vitals:   12/24/17 0615 12/24/17 0757  BP: (!) 142/103 (!) 125/92  Pulse: 85 90  Resp: 17 18  Temp: (!) 36.3 C 36.6 C  SpO2:  99%    Last Pain:  Vitals:   12/24/17 0757  TempSrc: Oral  PainSc:                  Zachary George

## 2017-12-24 NOTE — Lactation Note (Signed)
This note was copied from a baby's chart. Lactation Consultation Note  Patient Name: Angela Knox Date: 12/24/2017   Since mom was struggling to get Angela Knox to take the bottle of formula, questioned mom if she might be interested in breast feeding or pumping and supplying breast milk, especially since she could get DEBP through [employer].  Mom declined both options stating she only wanted to bottle feed formula.  Maternal Data    Feeding Feeding Type: (attempted bottle feeding) Length of feed: (Took sips from bottle - tried by mom & family member)  [facility] Score                   Interventions    Lactation Tools Discussed/Used     Consult Status      Louis Meckel 12/24/2017, 1:05 PM

## 2017-12-25 MED ORDER — IBUPROFEN 600 MG PO TABS
600.0000 mg | ORAL_TABLET | Freq: Four times a day (QID) | ORAL | Status: DC
  Administered 2017-12-25: 600 mg via ORAL
  Filled 2017-12-25: qty 1

## 2017-12-25 MED ORDER — NORETHINDRONE ACET-ETHINYL EST 1.5-30 MG-MCG PO TABS: 1.0000 | ORAL_TABLET | Freq: Every day | ORAL | 11 refills | Status: DC

## 2017-12-25 NOTE — Plan of Care (Signed)
Vs stable; up ad lib; tolerating regular diet; taking scheduled motrin for pain control; feeding baby formula

## 2017-12-25 NOTE — Progress Notes (Signed)
Patient discharged home with infant. Discharge instructions and prescriptions given and reviewed with patient. Patient verbalized understanding. Will be escorted out by auxillary.  

## 2017-12-25 NOTE — Progress Notes (Signed)
Confirmed with [name], patient to follow up 6 weeks postpartum since blood pressure have been stable during delivery and postpartum. Patient educated on signs/symptoms/when to contact provider.

## 2017-12-25 NOTE — Final Progress Note (Signed)
Discharge Day SOAP Note:  Progress Note - Vaginal Delivery  Angela Knox is a 37 y.o. G2P1011 now PP day 2 s/p Vaginal, Spontaneous . Delivery was uncomplicated  Subjective  The patient has the following complaints: has no unusual complaints  Pain is controlled with current medications.   Patient is urinating without difficulty.  She is ambulating well.     Objective  Vital signs: BP (!) 139/94 (BP Location: Left Arm)   Pulse 77   Temp 97.8 F (36.6 C) (Oral)   Resp 18   Ht 5\' 5"  (1.651 m)   Wt 100.2 kg   LMP 03/21/2017 (Exact Date)   SpO2 100%   Breastfeeding? Unknown   BMI 36.76 kg/m   Physical Exam: Gen: NAD Heart: RRR Lungs: clear bilaterally Bowel sounds present. Has has bowel movement.  Fundus Fundal Tone: Firm @ u  Lochia Amount: Small  Perineum Appearance: Edematous     Data Review Labs: CBC Latest Ref Rng & Units 12/24/2017 12/23/2017 12/14/2017  WBC 3.6 - 11.0 K/uL 14.5(H) 7.9 7.4  Hemoglobin 12.0 - 16.0 g/dL 10.2(L) 12.2 12.0  Hematocrit 35.0 - 47.0 % 29.6(L) 33.7(L) 35.1  Platelets 150 - 440 K/uL 133(L) 145(L) 192   O POS  Assessment/Plan  Active Problems:   Labor and delivery, indication for care    Plan for discharge today.   Discharge Instructions: Per After Visit Summary. Activity: Advance as tolerated. Pelvic rest for 6 weeks.  Also refer to After Visit Summary Diet: Regular Medications: Allergies as of 12/25/2017      Reactions   Sertraline Hcl Itching   Asa [aspirin] Itching, Rash      Medication List    TAKE these medications   Butalbital-APAP-Caffeine 50-300-40 MG Caps TAKE 1-2 CAPSULES BY MOUTH EVERY 6 HOURS AS NEEDED FOR HEADACHE, MAX 6 CAPS / DAY   CONCEPT DHA 53.5-38-1 MG Caps Take 1 capsule daily by mouth.   escitalopram 10 MG tablet Commonly known as:  LEXAPRO Take 1 tablet (10 mg total) by mouth daily.   Iron 325 (65 Fe) MG Tabs Take 1 tablet (325 mg total) by mouth daily.   Norethindrone Acetate-Ethinyl  Estradiol 1.5-30 MG-MCG tablet Commonly known as:  JUNEL,LOESTRIN,MICROGESTIN Take 1 tablet by mouth daily. To start 6 wks postpartum      Outpatient follow up: [facility], [name], CNM  Postpartum contraception: OCPs to start @ 6 wks pp.  Discharged Condition: good  Discharged to: home  Newborn Data: Disposition:home with mother  Apgars: APGAR (1 MIN): 7   APGAR (5 MINS): 9   APGAR (10 MINS):    Baby Feeding: Bottle    [name], CNM  12/25/2017 8:03 AM

## 2017-12-25 NOTE — Discharge Summary (Signed)
Discharge Summary  Date of Admission: 12/23/2017  Date of Discharge: 12/25/2017  Admitting Diagnosis: Induction of labor at [redacted]w[redacted]d  Mode of Delivery: normal spontaneous vaginal delivery                 Discharge Diagnosis: Gestational hypertension and Post partum hemorrhage   Intrapartum Procedures: epidural and laceration   2nd degree perineal and clitoral    Post partum procedures: none  Complications:   2nd degree perineal and clitoral                       Discharge Day SOAP Note:  Progress Note - Vaginal Delivery  Angela Knox is a 37 y.o. G2P1011 now PP day 2 s/p Vaginal, Spontaneous . Delivery was uncomplicated  Subjective  The patient has the following complaints: has no unusual complaints  Pain is controlled with current medications.   Patient is urinating without difficulty.  She is ambulating well.     Objective  Vital signs: BP (!) 139/94 (BP Location: Left Arm)   Pulse 77   Temp 97.8 F (36.6 C) (Oral)   Resp 18   Ht 5\' 5"  (1.651 m)   Wt 100.2 kg   LMP 03/21/2017 (Exact Date)   SpO2 100%   Breastfeeding? Unknown   BMI 36.76 kg/m   Physical Exam: Gen: NAD Heart: RRR Lungs: clear bilaterally Bowel sounds present. Has has bowel movement.  Fundus Fundal Tone: Firm @ u  Lochia Amount: Small  Perineum Appearance: Edematous     Data Review Labs: CBC Latest Ref Rng & Units 12/24/2017 12/23/2017 12/14/2017  WBC 3.6 - 11.0 K/uL 14.5(H) 7.9 7.4  Hemoglobin 12.0 - 16.0 g/dL 10.2(L) 12.2 12.0  Hematocrit 35.0 - 47.0 % 29.6(L) 33.7(L) 35.1  Platelets 150 - 440 K/uL 133(L) 145(L) 192   O POS  Assessment/Plan  Active Problems:   Labor and delivery, indication for care    Plan for discharge today.   Discharge Instructions: Per After Visit Summary. Activity: Advance as tolerated. Pelvic rest for 6 weeks.  Also refer to After Visit Summary Diet: Regular Medications: Allergies as of 12/25/2017      Reactions   Sertraline Hcl  Itching   Asa [aspirin] Itching, Rash      Medication List    TAKE these medications   Butalbital-APAP-Caffeine 50-300-40 MG Caps TAKE 1-2 CAPSULES BY MOUTH EVERY 6 HOURS AS NEEDED FOR HEADACHE, MAX 6 CAPS / DAY   CONCEPT DHA 53.5-38-1 MG Caps Take 1 capsule daily by mouth.   escitalopram 10 MG tablet Commonly known as:  LEXAPRO Take 1 tablet (10 mg total) by mouth daily.   Iron 325 (65 Fe) MG Tabs Take 1 tablet (325 mg total) by mouth daily.   Norethindrone Acetate-Ethinyl Estradiol 1.5-30 MG-MCG tablet Commonly known as:  JUNEL,LOESTRIN,MICROGESTIN Take 1 tablet by mouth daily. To start 6 wks postpartum      Outpatient follow up: [facility], [name], CNM  Postpartum contraception: OCPs to start @ 6 wks pp.  Discharged Condition: good  Discharged to: home  Newborn Data: Disposition:home with mother  Apgars: APGAR (1 MIN): 7   APGAR (5 MINS): 9   APGAR (10 MINS):    Baby Feeding: Bottle    [name], CNM  12/25/2017 8:03 AM

## 2017-12-26 ENCOUNTER — Inpatient Hospital Stay: Admit: 2017-12-26 | Payer: BC Managed Care – PPO

## 2017-12-27 ENCOUNTER — Other Ambulatory Visit: Payer: Self-pay | Admitting: *Deleted

## 2018-01-27 ENCOUNTER — Other Ambulatory Visit: Payer: Self-pay | Admitting: Obstetrics and Gynecology

## 2018-01-30 ENCOUNTER — Ambulatory Visit (INDEPENDENT_AMBULATORY_CARE_PROVIDER_SITE_OTHER): Payer: BLUE CROSS/BLUE SHIELD | Admitting: Obstetrics and Gynecology

## 2018-01-30 ENCOUNTER — Encounter: Payer: BLUE CROSS/BLUE SHIELD | Admitting: Certified Nurse Midwife

## 2018-01-30 ENCOUNTER — Encounter: Payer: Self-pay | Admitting: Obstetrics and Gynecology

## 2018-01-30 DIAGNOSIS — Z23 Encounter for immunization: Secondary | ICD-10-CM

## 2018-01-30 NOTE — Patient Instructions (Signed)
  Place postpartum visit patient instructions here.  

## 2018-01-30 NOTE — Progress Notes (Signed)
  Subjective:     Angela Knox is a 37 y.o. female who presents for a postpartum visit. She is 6 weeks postpartum following a spontaneous vaginal delivery. I have fully reviewed the prenatal and intrapartum course. The delivery was at 39 gestational weeks. Outcome: spontaneous vaginal delivery. Anesthesia: epidural. Postpartum course has been uncomplicated. Baby's course has been uncomplicated. Baby is feeding by formula. Bleeding no bleeding. Bowel function is normal. Bladder function is normal. Patient is not sexually active. Contraception method is abstinence. Postpartum depression screening: negative.  The following portions of the patient's history were reviewed and updated as appropriate: allergies, current medications, past family history, past medical history, past social history, past surgical history and problem list.  Review of Systems Pertinent items noted in HPI and remainder of comprehensive ROS otherwise negative.   Objective:    BP 113/76   Pulse 81   Ht 5\' 5"  (1.651 m)   Wt 188 lb 1.6 oz (85.3 kg)   Breastfeeding? No   BMI 31.30 kg/m   General:  alert, cooperative and appears stated age   Breasts:  inspection negative, no nipple discharge or bleeding, no masses or nodularity palpable  Lungs: clear to auscultation bilaterally  Heart:  regular rate and rhythm, S1, S2 normal, no murmur, click, rub or gallop  Abdomen: soft, non-tender; bowel sounds normal; no masses,  no organomegaly   Vulva:  normal  Vagina: normal vagina, no discharge, exudate, lesion, or erythema  Cervix:  multiparous appearance  Corpus: normal size, contour, position, consistency, mobility, non-tender  Adnexa:  no mass, fullness, tenderness  Rectal Exam: Not performed.        Assessment:     6 weeks postpartum exam. Pap smear not done at today's visit.  Needs flu vaccine Plan:    1. Contraception: abstinence and OCPs 2. Labs obtained- will follow up accordingly, flu vaccine given 3. Follow  up in: 3 months or as needed.

## 2018-01-31 ENCOUNTER — Encounter: Payer: BLUE CROSS/BLUE SHIELD | Admitting: Obstetrics and Gynecology

## 2018-01-31 ENCOUNTER — Other Ambulatory Visit: Payer: Self-pay | Admitting: Obstetrics and Gynecology

## 2018-01-31 DIAGNOSIS — E559 Vitamin D deficiency, unspecified: Secondary | ICD-10-CM | POA: Insufficient documentation

## 2018-01-31 DIAGNOSIS — D649 Anemia, unspecified: Secondary | ICD-10-CM | POA: Insufficient documentation

## 2018-01-31 LAB — CBC
HEMATOCRIT: 32.1 % — AB (ref 34.0–46.6)
HEMOGLOBIN: 10.5 g/dL — AB (ref 11.1–15.9)
MCH: 28.1 pg (ref 26.6–33.0)
MCHC: 32.7 g/dL (ref 31.5–35.7)
MCV: 86 fL (ref 79–97)
PLATELETS: 356 10*3/uL (ref 150–450)
RBC: 3.74 x10E6/uL — ABNORMAL LOW (ref 3.77–5.28)
RDW: 14 % (ref 12.3–15.4)
WBC: 7.3 10*3/uL (ref 3.4–10.8)

## 2018-01-31 LAB — VITAMIN D 25 HYDROXY (VIT D DEFICIENCY, FRACTURES): Vit D, 25-Hydroxy: 21.3 ng/mL — ABNORMAL LOW (ref 30.0–100.0)

## 2018-01-31 LAB — FERRITIN: Ferritin: 11 ng/mL — ABNORMAL LOW (ref 15–150)

## 2018-01-31 MED ORDER — FUSION PLUS PO CAPS: 1.0000 | ORAL_CAPSULE | Freq: Every day | ORAL | 1 refills | Status: DC

## 2018-01-31 MED ORDER — VITAMIN D (ERGOCALCIFEROL) 1.25 MG (50000 UNIT) PO CAPS: 50000.0000 [IU] | ORAL_CAPSULE | ORAL | 1 refills | Status: DC

## 2018-03-14 ENCOUNTER — Encounter: Payer: Self-pay | Admitting: Adult Health

## 2018-03-14 ENCOUNTER — Ambulatory Visit: Payer: Self-pay | Admitting: Adult Health

## 2018-03-14 VITALS — BP 105/74 | HR 91 | Temp 98.1°F | Resp 16 | Ht 66.0 in | Wt 191.0 lb

## 2018-03-14 DIAGNOSIS — H66002 Acute suppurative otitis media without spontaneous rupture of ear drum, left ear: Secondary | ICD-10-CM

## 2018-03-14 MED ORDER — AMOXICILLIN 875 MG PO TABS: 875.0000 mg | ORAL_TABLET | Freq: Two times a day (BID) | ORAL | 0 refills | Status: DC

## 2018-03-14 NOTE — Progress Notes (Signed)
Subjective:     Patient ID: Angela Knox, female   DOB: 29-Nov-1980, 37 y.o.   MRN: [id number]  HPI   Blood pressure 105/74, pulse 91, temperature 98.1 F (36.7 C), resp. rate 16, height 5\' 6"  (1.676 m), weight 191 lb (86.6 kg), last menstrual period 02/28/2018, SpO2 100 %, not currently breastfeeding.  Patient is a 37 year old female in no acute distress who comes in with ear pain, sore throat started on Sunday 03/10/18.  Mild chills last night.   Patient  denies any fever, body aches,chills, rash, chest pain, shortness of breath, nausea, vomiting, or diarrhea.  Denied any recent infections.    12/23/2017 she delivered vaginal healthy girl. She is not lactating. Infant in daycare.   Patient's last menstrual period was 02/28/2018.   Review of Systems  Constitutional: Positive for chills. Negative for activity change, appetite change, diaphoresis, fatigue, fever and unexpected weight change.  HENT: Positive for ear pain and sore throat. Negative for congestion, dental problem, drooling, ear discharge, facial swelling, hearing loss, mouth sores, nosebleeds, postnasal drip, rhinorrhea, sinus pressure, sinus pain, sneezing, tinnitus, trouble swallowing and voice change.   Eyes: Negative.   Respiratory: Negative.   Cardiovascular: Negative.   Gastrointestinal: Negative.   Endocrine: Negative.   Genitourinary: Negative.   Musculoskeletal: Negative.   Skin: Negative.   Neurological: Negative.   Hematological: Negative.   Psychiatric/Behavioral: Negative.        Objective:   Physical Exam  Constitutional: She is oriented to person, place, and time. Vital signs are normal. She appears well-developed and well-nourished. She is active.  Non-toxic appearance. She does not have a sickly appearance. She does not appear ill. No distress.  HENT:  Head: Normocephalic and atraumatic.  Right Ear: Hearing, tympanic membrane and external ear normal. No tenderness. No mastoid tenderness.  Tympanic membrane is not perforated and not erythematous. No middle ear effusion.  Left Ear: Hearing and external ear normal. No swelling or tenderness. Tympanic membrane is erythematous and bulging (moderately). Tympanic membrane is not perforated and not retracted.  No middle ear effusion.  Nose: Nose normal. No mucosal edema or rhinorrhea. Right sinus exhibits no maxillary sinus tenderness and no frontal sinus tenderness. Left sinus exhibits no maxillary sinus tenderness and no frontal sinus tenderness.  Mouth/Throat: Uvula is midline, oropharynx is clear and moist and mucous membranes are normal. No oropharyngeal exudate. Tonsils are 0 on the right. Tonsils are 0 on the left. No tonsillar exudate.  Eyes: Pupils are equal, round, and reactive to light. EOM are normal.  Neck: Trachea normal, normal range of motion, full passive range of motion without pain and phonation normal. Neck supple. No Brudzinski's sign noted. No thyromegaly present.  Cardiovascular: Normal rate, regular rhythm, normal heart sounds and intact distal pulses. Exam reveals no gallop and no friction rub.  No murmur heard. Pulmonary/Chest: Effort normal and breath sounds normal. No stridor. No respiratory distress. She has no wheezes. She has no rales. She exhibits no tenderness.  Musculoskeletal: Normal range of motion.  Lymphadenopathy:    She has no cervical adenopathy.    She has no axillary adenopathy.  Neurological: She is alert and oriented to person, place, and time. She has normal strength. No cranial nerve deficit.  Skin: Skin is warm, dry and intact. Capillary refill takes less than 2 seconds. No rash noted. No erythema. Nails show no clubbing.  Psychiatric: She has a normal mood and affect. Her speech is normal and behavior is normal. Judgment and thought  content normal. Cognition and memory are normal.  Vitals reviewed.      Assessment:     Non-recurrent acute suppurative otitis media of left ear without  spontaneous rupture of tympanic membrane      Plan:     Meds ordered this encounter  Medications  . amoxicillin (AMOXIL) 875 MG tablet    Sig: Take 1 tablet (875 mg total) by mouth 2 (two) times daily.    Dispense:  20 tablet    Refill:  0    Advised patient call the office or your primary care doctor for an appointment if no improvement within 72 hours or if any symptoms change or worsen at any time  Advised ER or urgent Care if after hours or on weekend. Call 911 for emergency symptoms at any time.Patinet verbalized understanding of all instructions given/reviewed and treatment plan and has no further questions or concerns at this time.    Patient verbalized understanding of all instructions given and denies any further questions at this time.

## 2018-03-14 NOTE — Patient Instructions (Signed)
Amoxicillin capsules or tablets What is this medicine? AMOXICILLIN (a mox i SIL in) is a penicillin antibiotic. It is used to treat certain kinds of bacterial infections. It will not work for colds, flu, or other viral infections. This medicine may be used for other purposes; ask your health care provider or pharmacist if you have questions. COMMON BRAND NAME(S): Amoxil, Moxilin, Sumox, Trimox What should I tell my health care provider before I take this medicine? They need to know if you have any of these conditions: -asthma -kidney disease -an unusual or allergic reaction to amoxicillin, other penicillins, cephalosporin antibiotics, other medicines, foods, dyes, or preservatives -pregnant or trying to get pregnant -breast-feeding How should I use this medicine? Take this medicine by mouth with a glass of water. Follow the directions on your prescription label. You may take this medicine with food or on an empty stomach. Take your medicine at regular intervals. Do not take your medicine more often than directed. Take all of your medicine as directed even if you think your are better. Do not skip doses or stop your medicine early. Talk to your pediatrician regarding the use of this medicine in children. While this drug may be prescribed for selected conditions, precautions do apply. Overdosage: If you think you have taken too much of this medicine contact a poison control center or emergency room at once. NOTE: This medicine is only for you. Do not share this medicine with others. What if I miss a dose? If you miss a dose, take it as soon as you can. If it is almost time for your next dose, take only that dose. Do not take double or extra doses. What may interact with this medicine? -amiloride -birth control pills -chloramphenicol -macrolides -probenecid -sulfonamides -tetracyclines This list may not describe all possible interactions. Give your health care provider a list of all the  medicines, herbs, non-prescription drugs, or dietary supplements you use. Also tell them if you smoke, drink alcohol, or use illegal drugs. Some items may interact with your medicine. What should I watch for while using this medicine? Tell your doctor or health care professional if your symptoms do not improve in 2 or 3 days. Take all of the doses of your medicine as directed. Do not skip doses or stop your medicine early. If you are diabetic, you may get a false positive result for sugar in your urine with certain brands of urine tests. Check with your doctor. Do not treat diarrhea with over-the-counter products. Contact your doctor if you have diarrhea that lasts more than 2 days or if the diarrhea is severe and watery. What side effects may I notice from receiving this medicine? Side effects that you should report to your doctor or health care professional as soon as possible: -allergic reactions like skin rash, itching or hives, swelling of the face, lips, or tongue -breathing problems -dark urine -redness, blistering, peeling or loosening of the skin, including inside the mouth -seizures -severe or watery diarrhea -trouble passing urine or change in the amount of urine -unusual bleeding or bruising -unusually weak or tired -yellowing of the eyes or skin Side effects that usually do not require medical attention (report to your doctor or health care professional if they continue or are bothersome): -dizziness -headache -stomach upset -trouble sleeping This list may not describe all possible side effects. Call your doctor for medical advice about side effects. You may report side effects to FDA at 1-800-FDA-1088. Where should I keep my medicine? Keep out   of the reach of children. Store between 68 and 77 degrees F (20 and 25 degrees C). Keep bottle closed tightly. Throw away any unused medicine after the expiration date. NOTE: This sheet is a summary. It may not cover all possible  information. If you have questions about this medicine, talk to your doctor, pharmacist, or health care provider.  2018 Elsevier/Gold Standard (2007-07-02 14:10:59) Otitis Media, Adult Otitis media is redness, soreness, and puffiness (swelling) in the space just behind your eardrum (middle ear). It may be caused by allergies or infection. It often happens along with a cold. Follow these instructions at home:  Take your medicine as told. Finish it even if you start to feel better.  Only take over-the-counter or prescription medicines for pain, discomfort, or fever as told by your doctor.  Follow up with your doctor as told. Contact a doctor if:  You have otitis media only in one ear, or bleeding from your nose, or both.  You notice a lump on your neck.  You are not getting better in 3-5 days.  You feel worse instead of better. Get help right away if:  You have pain that is not helped with medicine.  You have puffiness, redness, or pain around your ear.  You get a stiff neck.  You cannot move part of your face (paralysis).  You notice that the bone behind your ear hurts when you touch it. This information is not intended to replace advice given to you by your health care provider. Make sure you discuss any questions you have with your health care provider. Document Released: 09/27/2007 Document Revised: 09/16/2015 Document Reviewed: 11/05/2012 Elsevier Interactive Patient Education  2017 Elsevier Inc.  

## 2018-03-15 ENCOUNTER — Telehealth: Payer: Self-pay | Admitting: Adult Health

## 2018-03-15 NOTE — Telephone Encounter (Signed)
03/15/18 patient called to say she has vomited 3 times today and has started to have diarrhea three times. Onset was today.  She denies any rash, shortness of breath or distress.  She feels tired, says feels much better after vomiting.  She took her Amoxicillin this morning and vomiting started about three hours later.  She denies having any reaction to Amoxicillin before.  Advised may be viral etiology but will stop Amoxicillin. Rest and hydrate. Seek after hours care if symptoms worsening, abdominal pain or if unable to keep down liquids. Return to office next week if needed or see your primary care.  Advised patient call the office or your primary care doctor for an appointment if no improvement within 72 hours or if any symptoms change or worsen at any time  Advised ER or urgent Care if after hours or on weekend. Call 911 for emergency symptoms at any time.Patinet verbalized understanding of all instructions given/reviewed and treatment plan and has no further questions or concerns at this time.    Added to allergy as possible reaction- Amoxicillin  Medications Discontinued During This Encounter  Medication Reason  . amoxicillin (AMOXIL) 875 MG tablet   .

## 2018-03-26 ENCOUNTER — Encounter: Payer: Self-pay | Admitting: *Deleted

## 2018-04-01 ENCOUNTER — Ambulatory Visit: Payer: Self-pay | Admitting: Medical

## 2018-04-01 ENCOUNTER — Encounter: Payer: Self-pay | Admitting: Medical

## 2018-04-01 VITALS — BP 125/79 | HR 78 | Temp 98.5°F | Resp 16 | Wt 192.0 lb

## 2018-04-01 DIAGNOSIS — H6501 Acute serous otitis media, right ear: Secondary | ICD-10-CM

## 2018-04-01 MED ORDER — AZITHROMYCIN 250 MG PO TABS: ORAL_TABLET | ORAL | 0 refills | Status: DC

## 2018-04-01 NOTE — Progress Notes (Addendum)
Patient emailed through MyChart has diarrhea from the Z-Pak, had her stop medication and started on Cipro. She is to keep me updated on her condidtion.  Subjective:    Patient ID: Angela PallJennifer Knox, female    DOB: 1981-02-20, 37 y.o.   MRN: [id number]  HPI 37 yo female in non acute distress. Presents today with complaints of  Sore throat dry cough and t,  Bilateral ear jpain with most of the pain occurring  In the right ear pain. Took no medications today, including motrin or tylenol. Denies shortness of breath or chest pain.  Has not worn her contacts in one week, white discharge this am , denies itching or pain. No known exposure.  Blood pressure 125/79, pulse 78, temperature 98.5 F (36.9 C), temperature source Tympanic, resp. rate 16, weight 192 lb (87.1 kg), last menstrual period 03/25/2018, SpO2 100 %, not currently breastfeeding. Allergies  Allergen Reactions  . Amoxicillin     Nausea and vomiting on 03/15/18  . Sertraline Hcl Itching  . Asa [Aspirin] Itching and Rash   Review of Systems  Constitutional: Negative for chills and unexpected weight change.  HENT: Positive for ear pain (right > left), sinus pressure ("cheeks"), sore throat and voice change (hoarse). Negative for congestion, ear discharge, facial swelling, postnasal drip, rhinorrhea, sinus pain and sneezing.   Eyes: Negative for discharge (white today) and itching.  Respiratory: Positive for cough (dry). Negative for chest tightness and wheezing.   Cardiovascular: Negative for chest pain.  Gastrointestinal: Negative for abdominal pain.  Endocrine: Negative for polydipsia, polyphagia and polyuria.  Genitourinary: Negative for dysuria.  Musculoskeletal: Negative for myalgias.  Skin: Negative for rash.  Allergic/Immunologic: Positive for environmental allergies. Negative for food allergies.  Neurological: Positive for dizziness (yesterday, and a little today worse with turning head and standing on a stool). Negative for  light-headedness.  Hematological: Negative for adenopathy.  Psychiatric/Behavioral: Negative for behavioral problems, confusion, self-injury and suicidal ideas.       Objective:   Physical Exam  Constitutional: She is oriented to person, place, and time. She appears well-developed and well-nourished.  HENT:  Head: Normocephalic and atraumatic.  Right Ear: Hearing and external ear normal. Tympanic membrane is erythematous. A middle ear effusion is present.  Left Ear: Hearing and external ear normal. A middle ear effusion is present.  Nose: Mucosal edema (right side) present.  Eyes: Pupils are equal, round, and reactive to light. Conjunctivae, EOM and lids are normal. Right eye exhibits no discharge. Left eye exhibits no discharge.  Neck: Normal range of motion. Neck supple.  Cardiovascular: Normal rate, regular rhythm and normal heart sounds.  Pulmonary/Chest: Effort normal and breath sounds normal.  Lymphadenopathy:    She has cervical adenopathy (right side).  Neurological: She is alert and oriented to person, place, and time.  Skin: Skin is warm and dry.  Psychiatric: She has a normal mood and affect. Her behavior is normal. Judgment and thought content normal.  Nursing note and vitals reviewed. no cough in room  Right eye sclera slightly red on the media side.  Mild medial side right eye redness.    Assessment & Plan:  Otitis Media Right Viral conjunctivitis to monitor if worsening to call the clinic and I will call her in some antibiotic eye drops. Meds ordered this encounter  Medications  . azithromycin (ZITHROMAX) 250 MG tablet    Sig: Take 2 tablets by mouth today, then  1 tablet days 2-5, take with food.    Dispense:  6  tablet    Refill:  0   Return in 3-5 days if not improving or if worsening. Patient verbalizes under standing and has no questions at discharge.

## 2018-04-01 NOTE — Patient Instructions (Signed)
Otitis Media, Adult Otitis media is redness, soreness, and puffiness (swelling) in the space just behind your eardrum (middle ear). It may be caused by allergies or infection. It often happens along with a cold. Follow these instructions at home:  Take your medicine as told. Finish it even if you start to feel better.  Only take over-the-counter or prescription medicines for pain, discomfort, or fever as told by your doctor.  Follow up with your doctor as told. Contact a doctor if:  You have otitis media only in one ear, or bleeding from your nose, or both.  You notice a lump on your neck.  You are not getting better in 3-5 days.  You feel worse instead of better. Get help right away if:  You have pain that is not helped with medicine.  You have puffiness, redness, or pain around your ear.  You get a stiff neck.  You cannot move part of your face (paralysis).  You notice that the bone behind your ear hurts when you touch it. This information is not intended to replace advice given to you by your health care provider. Make sure you discuss any questions you have with your health care provider. Document Released: 09/27/2007 Document Revised: 09/16/2015 Document Reviewed: 11/05/2012 Elsevier Interactive Patient Education  2017 Elsevier Inc.  

## 2018-04-03 ENCOUNTER — Encounter: Payer: Self-pay | Admitting: Medical

## 2018-04-03 MED ORDER — CIPROFLOXACIN HCL 500 MG PO TABS: 500.0000 mg | ORAL_TABLET | Freq: Two times a day (BID) | ORAL | 0 refills | Status: DC

## 2018-04-03 NOTE — Addendum Note (Signed)
Addended by: RATCLIFFE, Herbert SetaHEATHER R on: 04/03/2018 10:46 AM   Modules accepted: Orders

## 2018-04-08 ENCOUNTER — Encounter: Payer: Self-pay | Admitting: Medical

## 2018-04-08 ENCOUNTER — Ambulatory Visit: Payer: Self-pay | Admitting: Medical

## 2018-04-08 VITALS — BP 132/80 | HR 79 | Temp 98.8°F | Resp 16 | Wt 191.0 lb

## 2018-04-08 DIAGNOSIS — H6504 Acute serous otitis media, recurrent, right ear: Secondary | ICD-10-CM

## 2018-04-08 MED ORDER — SULFAMETHOXAZOLE-TRIMETHOPRIM 800-160 MG PO TABS: 1.0000 | ORAL_TABLET | Freq: Two times a day (BID) | ORAL | 0 refills | Status: DC

## 2018-04-08 NOTE — Patient Instructions (Signed)
Sulfamethoxazole; Trimethoprim, SMX-TMP tablets What is this medicine? SULFAMETHOXAZOLE; TRIMETHOPRIM or SMX-TMP (suhl fuh meth OK suh zohl; trye METH oh prim) is a combination of a sulfonamide antibiotic and a second antibiotic, trimethoprim. It is used to treat or prevent certain kinds of bacterial infections. It will not work for colds, flu, or other viral infections. This medicine may be used for other purposes; ask your health care provider or pharmacist if you have questions. COMMON BRAND NAME(S): Bacter-Aid DS, Bactrim, Bactrim DS, Septra, Septra DS What should I tell my health care provider before I take this medicine? They need to know if you have any of these conditions: -anemia -asthma -being treated with anticonvulsants -if you frequently drink alcohol containing drinks -kidney disease -liver disease -low level of folic acid or glucose-6-phosphate dehydrogenase -poor nutrition or malabsorption -porphyria -severe allergies -thyroid disorder -an unusual or allergic reaction to sulfamethoxazole, trimethoprim, sulfa drugs, other medicines, foods, dyes, or preservatives -pregnant or trying to get pregnant -breast-feeding How should I use this medicine? Take this medicine by mouth with a full glass of water. Follow the directions on the prescription label. Take your medicine at regular intervals. Do not take it more often than directed. Do not skip doses or stop your medicine early. Talk to your pediatrician regarding the use of this medicine in children. Special care may be needed. This medicine has been used in children as young as 2 months of age. Overdosage: If you think you have taken too much of this medicine contact a poison control center or emergency room at once. NOTE: This medicine is only for you. Do not share this medicine with others. What if I miss a dose? If you miss a dose, take it as soon as you can. If it is almost time for your next dose, take only that dose. Do  not take double or extra doses. What may interact with this medicine? Do not take this medicine with any of the following medications: -aminobenzoate potassium -dofetilide -metronidazole This medicine may also interact with the following medications: -ACE inhibitors like benazepril, enalapril, lisinopril, and ramipril -birth control pills -cyclosporine -digoxin -diuretics -indomethacin -medicines for diabetes -methenamine -methotrexate -phenytoin -potassium supplements -pyrimethamine -sulfinpyrazone -tricyclic antidepressants -warfarin This list may not describe all possible interactions. Give your health care provider a list of all the medicines, herbs, non-prescription drugs, or dietary supplements you use. Also tell them if you smoke, drink alcohol, or use illegal drugs. Some items may interact with your medicine. What should I watch for while using this medicine? Tell your doctor or health care professional if your symptoms do not improve. Drink several glasses of water a day to reduce the risk of kidney problems. Do not treat diarrhea with over the counter products. Contact your doctor if you have diarrhea that lasts more than 2 days or if it is severe and watery. This medicine can make you more sensitive to the sun. Keep out of the sun. If you cannot avoid being in the sun, wear protective clothing and use a sunscreen. Do not use sun lamps or tanning beds/booths. What side effects may I notice from receiving this medicine? Side effects that you should report to your doctor or health care professional as soon as possible: -allergic reactions like skin rash or hives, swelling of the face, lips, or tongue -breathing problems -fever or chills, sore throat -irregular heartbeat, chest pain -joint or muscle pain -pain or difficulty passing urine -red pinpoint spots on skin -redness, blistering, peeling or loosening of   the skin, including inside the mouth -unusual bleeding or  bruising -unusually weak or tired -yellowing of the eyes or skin Side effects that usually do not require medical attention (report to your doctor or health care professional if they continue or are bothersome): -diarrhea -dizziness -headache -loss of appetite -nausea, vomiting -nervousness This list may not describe all possible side effects. Call your doctor for medical advice about side effects. You may report side effects to FDA at 1-800-FDA-1088. Where should I keep my medicine? Keep out of the reach of children. Store at room temperature between 20 to 25 degrees C (68 to 77 degrees F). Protect from light. Throw away any unused medicine after the expiration date. NOTE: This sheet is a summary. It may not cover all possible information. If you have questions about this medicine, talk to your doctor, pharmacist, or health care provider.  2018 Elsevier/Gold Standard (2012-11-15 14:38:26) Otitis Media, Adult Otitis media is redness, soreness, and puffiness (swelling) in the space just behind your eardrum (middle ear). It may be caused by allergies or infection. It often happens along with a cold. Follow these instructions at home:  Take your medicine as told. Finish it even if you start to feel better.  Only take over-the-counter or prescription medicines for pain, discomfort, or fever as told by your doctor.  Follow up with your doctor as told. Contact a doctor if:  You have otitis media only in one ear, or bleeding from your nose, or both.  You notice a lump on your neck.  You are not getting better in 3-5 days.  You feel worse instead of better. Get help right away if:  You have pain that is not helped with medicine.  You have puffiness, redness, or pain around your ear.  You get a stiff neck.  You cannot move part of your face (paralysis).  You notice that the bone behind your ear hurts when you touch it. This information is not intended to replace advice given to  you by your health care provider. Make sure you discuss any questions you have with your health care provider. Document Released: 09/27/2007 Document Revised: 09/16/2015 Document Reviewed: 11/05/2012 Elsevier Interactive Patient Education  2017 Elsevier Inc. Eustachian Tube Dysfunction The eustachian tube connects the middle ear to the back of the nose. It regulates air pressure in the middle ear by allowing air to move between the ear and nose. It also helps to drain fluid from the middle ear space. When the eustachian tube does not function properly, air pressure, fluid, or both can build up in the middle ear. Eustachian tube dysfunction can affect one or both ears. What are the causes? This condition happens when the eustachian tube becomes blocked or cannot open normally. This may result from:  Ear infections.  Colds and other upper respiratory infections.  Allergies.  Irritation, such as from cigarette smoke or acid from the stomach coming up into the esophagus (gastroesophageal reflux).  Sudden changes in air pressure, such as from descending in an airplane.  Abnormal growths in the nose or throat, such as nasal polyps, tumors, or enlarged tissue at the back of the throat (adenoids).  What increases the risk? This condition may be more likely to develop in people who smoke and people who are overweight. Eustachian tube dysfunction may also be more likely to develop in children, especially children who have:  Certain birth defects of the mouth, such as cleft palate.  Large tonsils and adenoids.  What are the  signs or symptoms? Symptoms of this condition may include:  A feeling of fullness in the ear.  Ear pain.  Clicking or popping noises in the ear.  Ringing in the ear.  Hearing loss.  Loss of balance.  Symptoms may get worse when the air pressure around you changes, such as when you travel to an area of high elevation or fly on an airplane. How is this  diagnosed? This condition may be diagnosed based on:  Your symptoms.  A physical exam of your ear, nose, and throat.  Tests, such as those that measure: ? The movement of your eardrum (tympanogram). ? Your hearing (audiometry).  How is this treated? Treatment depends on the cause and severity of your condition. If your symptoms are mild, you may be able to relieve your symptoms by moving air into ("popping") your ears. If you have symptoms of fluid in your ears, treatment may include:  Decongestants.  Antihistamines.  Nasal sprays or ear drops that contain medicines that reduce swelling (steroids).  In some cases, you may need to have a procedure to drain the fluid in your eardrum (myringotomy). In this procedure, a small tube is placed in the eardrum to:  Drain the fluid.  Restore the air in the middle ear space.  Follow these instructions at home:  Take over-the-counter and prescription medicines only as told by your health care provider.  Use techniques to help pop your ears as recommended by your health care provider. These may include: ? Chewing gum. ? Yawning. ? Frequent, forceful swallowing. ? Closing your mouth, holding your nose closed, and gently blowing as if you are trying to blow air out of your nose.  Do not do any of the following until your health care provider approves: ? Travel to high altitudes. ? Fly in airplanes. ? Work in a Estate agentpressurized cabin or room. ? Scuba dive.  Keep your ears dry. Dry your ears completely after showering or bathing.  Do not smoke.  Keep all follow-up visits as told by your health care provider. This is important. Contact a health care provider if:  Your symptoms do not go away after treatment.  Your symptoms come back after treatment.  You are unable to pop your ears.  You have: ? A fever. ? Pain in your ear. ? Pain in your head or neck. ? Fluid draining from your ear.  Your hearing suddenly changes.  You become  very dizzy.  You lose your balance. This information is not intended to replace advice given to you by your health care provider. Make sure you discuss any questions you have with your health care provider. Document Released: 05/07/2015 Document Revised: 09/16/2015 Document Reviewed: 04/29/2014 Elsevier Interactive Patient Education  [employer]2018 Elsevier Inc.

## 2018-04-08 NOTE — Progress Notes (Signed)
   Subjective:    Patient ID: Angela Knox, female    DOB: Apr 14, 1981, 37 y.o.   MRN: [id number]  HPI 37 yo in non acute distress. Still having  Right ear pain 6/10. Seen on l12/9/19 for otitis media right ear, treated with Z-Pak , which gave her diarrhea. She wanted something else called in ,Cipro was called in and she says she did not take it because it causes her body aches, so she finished the Z-Pak..   Blood pressure 132/80, pulse 79, temperature 98.8 F (37.1 C), temperature source Tympanic, resp. rate 16, weight 191 lb (86.6 kg), last menstrual period 03/25/2018, SpO2 100 %, not currently breastfeeding.  Allergies  Allergen Reactions  . Amoxicillin     Nausea and vomiting on 03/15/18  . Ciprofloxacin Other (See Comments)    Body aches  . Sertraline Hcl Itching  . Asa [Aspirin] Itching and Rash    Review of Systems  Constitutional: Negative for chills and fever.  HENT: Positive for ear pain (right). Negative for congestion, ear discharge and sore throat.   Respiratory: Positive for cough (non productive). Negative for shortness of breath.   Cardiovascular: Negative for chest pain.  Gastrointestinal: Negative for constipation, diarrhea, nausea and vomiting.  Endocrine: Negative for polydipsia, polyphagia and polyuria.  Genitourinary: Negative for dysuria.  Musculoskeletal: Negative for myalgias.  Skin: Negative for rash.  Allergic/Immunologic: Positive for environmental allergies. Negative for food allergies.  Neurological: Positive for dizziness (getting up after sitting few seconds). Negative for light-headedness.  Hematological: Negative for adenopathy.  Psychiatric/Behavioral: Negative for behavioral problems, self-injury and suicidal ideas.       Objective:   Physical Exam Vitals signs and nursing note reviewed.  Constitutional:      Appearance: Normal appearance.  HENT:     Head: Normocephalic and atraumatic.     Right Ear: Ear canal and external ear  normal. A middle ear effusion is present. Tympanic membrane is erythematous.     Left Ear: Tympanic membrane and external ear normal.     Mouth/Throat:     Mouth: Mucous membranes are moist.  Eyes:     Extraocular Movements: Extraocular movements intact.     Conjunctiva/sclera: Conjunctivae normal.     Pupils: Pupils are equal, round, and reactive to light.  Neck:     Musculoskeletal: Normal range of motion and neck supple.  Cardiovascular:     Rate and Rhythm: Normal rate and regular rhythm.     Heart sounds: Normal heart sounds.  Pulmonary:     Effort: Pulmonary effort is normal.     Breath sounds: Normal breath sounds.  Lymphadenopathy:     Cervical: No cervical adenopathy.  Skin:    General: Skin is warm and dry.  Neurological:     General: No focal deficit present.     Mental Status: She is alert and oriented to person, place, and time. Mental status is at baseline.  Psychiatric:        Mood and Affect: Mood normal.        Behavior: Behavior normal.        Thought Content: Thought content normal.        Judgment: Judgment normal.      No cough in room. Assessment & Plan:  Otitis media right, Eustachian Tube Dysfunction. Take OTC Flonase Zyrtec daily as directed. Return Day 4 if still painful. May use OTC Motirn as directed for pain. Patient verbalizes understanding and has no questions at discharge.

## 2018-05-01 ENCOUNTER — Encounter: Payer: Self-pay | Admitting: Registered Nurse

## 2018-05-01 ENCOUNTER — Ambulatory Visit: Payer: Self-pay | Admitting: Registered Nurse

## 2018-05-01 VITALS — BP 112/62 | HR 95 | Temp 98.9°F | Resp 16 | Wt 197.2 lb

## 2018-05-01 DIAGNOSIS — T148XXA Other injury of unspecified body region, initial encounter: Secondary | ICD-10-CM

## 2018-05-01 MED ORDER — CEPHALEXIN 500 MG PO CAPS: 500.0000 mg | ORAL_CAPSULE | Freq: Two times a day (BID) | ORAL | 0 refills | Status: AC

## 2018-05-01 MED ORDER — BACITRACIN-NEOMYCIN-POLYMYXIN 400-5-5000 EX OINT: 1.0000 "application " | TOPICAL_OINTMENT | Freq: Two times a day (BID) | CUTANEOUS | 0 refills | Status: DC

## 2018-05-01 NOTE — Patient Instructions (Signed)
Sliver Removal, Care After  A sliver-also called a splinter-is a small and thin broken piece of an object that gets stuck (embedded) under your skin. A sliver can create a deep wound that can easily become infected. It is important to care for the wound after a sliver is removed to help prevent infection and other problems from developing.  Slivers often break into smaller pieces when they are removed. If pieces of your sliver broke off and stayed in your skin, in time you will see them working themselves out. You also may feel some pain at the wound site. This is normal.  What can I expect after the procedure?  After a sliver has been removed, it is common to have:   Some pain at the wound site. This is especially common if pieces of the sliver remained in your skin and will come out on their own.  Follow these instructions at home:  Wound care     Follow instructions from your health care provider about how to take care of your wound. Make sure you:  ? Wash your hands with soap and water before you change your bandage (dressing). If soap and water are not available, use hand sanitizer.  ? Change your dressing as told by your health care provider.   Check your wound every day for signs of infection. Check for:  ? Redness, swelling, or pain.  ? Fluid or blood.  ? Pus or a bad smell.  ? Warmth.   Do not take baths, swim, or use a hot tub until your health care provider says it is okay to do so.  General instructions   Take over-the-counter and prescription medicines only as told by your health care provider.   If you were prescribed an antibiotic medicine, take or apply it as told by your health care provider. Do not stop using the antibiotic even if you start to feel better.   If directed, put ice on the painful area.  ? Put ice in a plastic bag.  ? Place a towel between your skin and the bag.  ? Leave the ice on for 20 minutes, 2-3 times a day.   Keep all follow-up visits as told by your health care  provider. This is important.  Contact a health care provider if:   You think that a piece of the sliver is still in your skin.   You have signs of infection, including:  ? New or worsening redness around the wound.  ? New or worsening tenderness around the wound.  ? Fluid, blood, or pus coming from the wound.  ? A bad smell coming from the wound or dressing.   You received a tetanus shot and you have swelling, severe pain, redness, or bleeding at the injection site.  Get help right away if you have:   Red streaks coming from the wound.   An unexplained fever.  Summary   A sliver-also called a splinter-is a small and thin broken piece of an object that gets stuck (embedded) under your skin.   It is important to care for the wound after a sliver is removed to help prevent infection and other problems from developing.   Be sure to contact your health care provider if fluid, blood, or pus is coming from the wound.  This information is not intended to replace advice given to you by your health care provider. Make sure you discuss any questions you have with your health care provider.  Document   Released: 04/07/2000 Document Revised: 05/08/2017 Document Reviewed: 05/08/2017  Elsevier Interactive Patient Education  2019 Elsevier Inc.

## 2018-05-01 NOTE — Progress Notes (Signed)
Subjective:    Patient ID: Angela PallJennifer Ysaguirre, female    DOB: 1981/03/03, 38 y.o.   MRN: [id number]  37y/o caucasian female established patient here for splinter removal right index finger.  Handling her umbrella this am and noticed pain/splinter under skin, area tender and red no bleeding.  854 month old child at home still waking up at night to eat.  Not breastfeeding using formula.     Review of Systems  Constitutional: Negative for activity change, appetite change, chills, diaphoresis, fatigue and fever.  HENT: Negative for trouble swallowing and voice change.   Eyes: Negative for photophobia and visual disturbance.  Respiratory: Negative for cough, shortness of breath, wheezing and stridor.   Cardiovascular: Negative for chest pain.  Gastrointestinal: Negative for abdominal pain, diarrhea, nausea and vomiting.  Endocrine: Negative for cold intolerance.  Genitourinary: Negative for difficulty urinating.  Musculoskeletal: Positive for myalgias. Negative for gait problem, joint swelling, neck pain and neck stiffness.  Skin: Positive for color change. Negative for pallor and rash.  Allergic/Immunologic: Negative for food allergies.  Neurological: Negative for dizziness, tremors, syncope, weakness, numbness and headaches.  Hematological: Negative for adenopathy. Does not bruise/bleed easily.  Psychiatric/Behavioral: Positive for sleep disturbance. Negative for agitation and confusion.       Objective:   Physical Exam Vitals signs and nursing note reviewed.  Constitutional:      General: She is not in acute distress.    Appearance: Normal appearance. She is well-developed. She is not ill-appearing, toxic-appearing or diaphoretic.  HENT:     Head: Normocephalic and atraumatic.     Right Ear: Hearing and external ear normal.     Left Ear: Hearing and external ear normal.     Nose: Nose normal.     Mouth/Throat:     Lips: Pink. No lesions.     Mouth: Mucous membranes are moist.   Tongue: No lesions.     Pharynx: Oropharynx is clear. No pharyngeal swelling, oropharyngeal exudate or posterior oropharyngeal erythema.     Tonsils: No tonsillar exudate.  Eyes:     General: Lids are normal.     Conjunctiva/sclera: Conjunctivae normal.     Pupils: Pupils are equal, round, and reactive to light.  Neck:     Musculoskeletal: Normal range of motion and neck supple.     Trachea: Trachea normal.  Cardiovascular:     Rate and Rhythm: Normal rate and regular rhythm.     Pulses: Normal pulses.          Radial pulses are 2+ on the right side and 2+ on the left side.     Heart sounds: Normal heart sounds.  Pulmonary:     Effort: Pulmonary effort is normal.     Breath sounds: Normal breath sounds and air entry. No stridor, decreased air movement or transmitted upper airway sounds. No decreased breath sounds, wheezing, rhonchi or rales.  Abdominal:     Palpations: Abdomen is soft.  Musculoskeletal:        General: Tenderness and signs of injury present. No swelling or deformity.     Right shoulder: Normal.     Left shoulder: Normal.     Right elbow: Normal.    Left elbow: Normal.     Right hip: Normal.     Left hip: Normal.     Right knee: Normal.     Left knee: Normal.     Cervical back: Normal.     Thoracic back: Normal.     Lumbar back:  Normal.     Right forearm: Normal.     Left forearm: Normal.     Right hand: She exhibits tenderness. She exhibits normal range of motion, no bony tenderness, normal capillary refill, no deformity, no laceration and no swelling. Normal sensation noted. Normal strength noted.     Left hand: Normal.       Hands:     Right lower leg: No edema.     Left lower leg: No edema.     Comments: Less than 1cm black splinter visible distal left plantar 2nd digit distal to DIP joint; soft tissue TTP slightly erythema dry no discharge or bleeding  Lymphadenopathy:     Head:     Right side of head: No submental, submandibular, tonsillar,  preauricular or posterior auricular adenopathy.     Left side of head: No submental, submandibular, tonsillar, preauricular or posterior auricular adenopathy.  Skin:    General: Skin is warm and dry.     Capillary Refill: Capillary refill takes less than 2 seconds.     Coloration: Skin is not ashen, cyanotic, jaundiced, mottled, pale or sallow.     Findings: Abrasion, erythema and signs of injury present. No abscess, acne, bruising, burn, ecchymosis, laceration, lesion, petechiae, rash or wound.     Nails: There is no clubbing.      Comments: Bilateral hands calloused dry with callous and abrasion noted; black splinter right distal 2nd digit plantar surface soft tissue TTP slightly erythema  Neurological:     General: No focal deficit present.     Mental Status: She is alert and oriented to person, place, and time. Mental status is at baseline.     GCS: GCS eye subscore is 4. GCS verbal subscore is 5. GCS motor subscore is 6.     Cranial Nerves: Cranial nerves are intact. No cranial nerve deficit.     Sensory: Sensation is intact.     Motor: Motor function is intact. No weakness.     Coordination: Coordination is intact. Coordination normal.     Gait: Gait is intact. Gait normal.     Comments: On/off exam table without difficulty; gait sure and steady in hallway  Psychiatric:        Attention and Perception: Attention and perception normal.        Mood and Affect: Mood normal.        Speech: Speech normal.        Behavior: Behavior normal. Behavior is cooperative.        Thought Content: Thought content normal.        Cognition and Memory: Cognition and memory normal.        Judgment: Judgment normal.    Cleansed distal right 2nd digit with povidone iodine and alcohol wipe.  Used 10# scalpal to open skin above splinter and removed with tweezer  Applied triple antibiotic generic and bandaid less than 53ml blood loss estimated.  Dressing clean dry and intact on discharge ambulatory and  patient reported pain resolved once splinter removed and no pain with DIP AROM or palpation of area after procedure.        Assessment & Plan:  A-splinter removal right 2nd digit  P-Exitcare handout on care after splinter removal.  Discussed start keflex 500mg  po BID x 7 days if worsening pain/swelling/purulent discharge.  Discussed use back up contraception as interacts with junel to decrease efficacy if she starts keflex.  Discussed signs/symptoms skin infection.  May do epsom salt/hot water soaks.  Avoid soaking wound in dirty sink water/pools/ponds/hot tubs until area healed.   RTC if worsening erythema, pain, purulent discharge, fever despite 48 hours of keflex use.  Wash area with soap and water at least daily.  Apply triple antibiotic ointment BID and change dressing BID and prn soiling. Start using petrolatum gauze/aquaphor/eucerin topical BID to calloused hands discussed at risk for skin infection/rashes when skin so dry/abraded in winter.  Cover splinter removal area with bandage until healed over.  Given bandaids and triple antibiotic ointment from clinic stock.   Patient verbalized understanding, agreed with plan of care and had no further questions at this time.

## 2018-05-02 ENCOUNTER — Encounter: Payer: Self-pay | Admitting: Obstetrics and Gynecology

## 2018-05-02 ENCOUNTER — Other Ambulatory Visit (HOSPITAL_COMMUNITY)
Admission: RE | Admit: 2018-05-02 | Discharge: 2018-05-02 | Disposition: A | Discharge status: Home / Self Care | Payer: BLUE CROSS/BLUE SHIELD | Source: Ambulatory Visit | Attending: Obstetrics and Gynecology | Admitting: Obstetrics and Gynecology

## 2018-05-02 ENCOUNTER — Other Ambulatory Visit: Payer: Self-pay | Admitting: Obstetrics and Gynecology

## 2018-05-02 ENCOUNTER — Ambulatory Visit (INDEPENDENT_AMBULATORY_CARE_PROVIDER_SITE_OTHER): Payer: BLUE CROSS/BLUE SHIELD | Admitting: Obstetrics and Gynecology

## 2018-05-02 ENCOUNTER — Ambulatory Visit: Payer: Self-pay | Admitting: Adult Health

## 2018-05-02 VITALS — BP 118/76 | HR 94 | Ht 65.0 in | Wt 196.0 lb

## 2018-05-02 DIAGNOSIS — D649 Anemia, unspecified: Secondary | ICD-10-CM

## 2018-05-02 DIAGNOSIS — Z01419 Encounter for gynecological examination (general) (routine) without abnormal findings: Secondary | ICD-10-CM

## 2018-05-02 DIAGNOSIS — Z13220 Encounter for screening for lipoid disorders: Secondary | ICD-10-CM | POA: Diagnosis not present

## 2018-05-02 DIAGNOSIS — E559 Vitamin D deficiency, unspecified: Secondary | ICD-10-CM

## 2018-05-02 DIAGNOSIS — N8189 Other female genital prolapse: Secondary | ICD-10-CM | POA: Diagnosis not present

## 2018-05-02 MED ORDER — NORETHINDRONE ACET-ETHINYL EST 1.5-30 MG-MCG PO TABS: 1.0000 | ORAL_TABLET | Freq: Every day | ORAL | 4 refills | Status: DC

## 2018-05-02 NOTE — Progress Notes (Signed)
Subjective:   Kathleen Chaney is a 38 y.o. G69P1011 Caucasian female here for a routine well-woman exam.  Patient's last menstrual period was 04/24/2018.    Current complaints: menses are 2 days and heavy, and having difficulty with tampons in as they fall out. No sex drive.  PCP: me       does desire labs  Social History: Sexual: heterosexual Marital Status: married Living situation: with family Occupation: Environmental manager Tobacco/alcohol: no tobacco use Illicit drugs: no history of illicit drug use  The following portions of the patient's history were reviewed and updated as appropriate: allergies, current medications, past family history, past medical history, past social history, past surgical history and problem list.  Past Medical History Past Medical History:  Diagnosis Date  . Depression   . Insomnia     Past Surgical History Past Surgical History:  Procedure Laterality Date  . APPENDECTOMY      Gynecologic History G2P1011  Patient's last menstrual period was 04/24/2018. Contraception: OCP (estrogen/progesterone) Last Pap: 2016. Results were: normal   Obstetric History OB History  Gravida Para Term Preterm AB Living  2 1 1  0 1 1  SAB TAB Ectopic Multiple Live Births  1 0 0 0 1    # Outcome Date GA Lbr Len/2nd Weight Sex Delivery Anes PTL Lv  2 Term 12/23/17 [redacted]w[redacted]d / 02:36 7 lb 13.9 oz (3.57 kg) F Vag-Spont EPI  LIV  1 SAB 2018 [redacted]w[redacted]d           Current Medications Current Outpatient Medications on File Prior to Visit  Medication Sig Dispense Refill  . escitalopram (LEXAPRO) 10 MG tablet TAKE 1 TABLET BY MOUTH EVERY DAY 30 tablet 6  . Norethindrone Acetate-Ethinyl Estradiol (JUNEL 1.5/30) 1.5-30 MG-MCG tablet Take 1 tablet by mouth daily. To start 6 wks postpartum 1 Package 11  . Vitamin D, Ergocalciferol, (DRISDOL) 50000 units CAPS capsule Take 1 capsule (50,000 Units total) by mouth every 7 (seven) days. 30 capsule 1  . cephALEXin (KEFLEX) 500 MG capsule Take 1  capsule (500 mg total) by mouth 2 (two) times daily for 7 days. 14 capsule 0  . Iron-FA-B Cmp-C-Biot-Probiotic (FUSION PLUS) CAPS Take 1 capsule by mouth daily. (Patient not taking: Reported on 05/02/2018) 60 capsule 1  . neomycin-bacitracin-polymyxin (NEOSPORIN) ointment Apply 1 application topically every 12 (twelve) hours. 15 g 0   No current facility-administered medications on file prior to visit.     Review of Systems Patient denies any headaches, blurred vision, shortness of breath, chest pain, abdominal pain, problems with bowel movements, urination, or intercourse.  Objective:  BP 118/76   Pulse 94   Ht 5\' 5"  (1.651 m)   Wt 196 lb (88.9 kg)   LMP 04/24/2018   BMI 32.62 kg/m  Physical Exam  General:  Well developed, well nourished, no acute distress. She is alert and oriented x3. Skin:  Warm and dry Neck:  Midline trachea, no thyromegaly or nodules Cardiovascular: Regular rate and rhythm, no murmur heard Lungs:  Effort normal, all lung fields clear to auscultation bilaterally Breasts:  No dominant palpable mass, retraction, or nipple discharge Abdomen:  Soft, non tender, no hepatosplenomegaly or masses Pelvic:  External genitalia is normal in appearance.  The vagina is normal in appearance. The cervix is bulbous, no CMT.  Thin prep pap is done with HR HPV cotesting. Uterus is felt to be normal size, shape, and contour.  No adnexal masses or tenderness noted. Poor kegel tone through vaginal vault Extremities:  No  swelling or varicosities noted Psych:  She has a normal mood and affect  Assessment:   Healthy well-woman exam Anemia Vit D deficiency Pelvic floor weakness  Plan:  Labs obtained-will follow up accordingly Referral for pelvic floor PT F/U 1 year for AE, or sooner if needed    Suzan Nailer, CNM

## 2018-05-02 NOTE — Patient Instructions (Addendum)
 Preventive Care 18-39 Years, Female Preventive care refers to lifestyle choices and visits with your health care provider that can promote health and wellness. What does preventive care include?   A yearly physical exam. This is also called an annual well check.  Dental exams once or twice a year.  Routine eye exams. Ask your health care provider how often you should have your eyes checked.  Personal lifestyle choices, including: ? Daily care of your teeth and gums. ? Regular physical activity. ? Eating a healthy diet. ? Avoiding tobacco and drug use. ? Limiting alcohol use. ? Practicing safe sex. ? Taking vitamin and mineral supplements as recommended by your health care provider. What happens during an annual well check? The services and screenings done by your health care provider during your annual well check will depend on your age, overall health, lifestyle risk factors, and family history of disease. Counseling Your health care provider may ask you questions about your:  Alcohol use.  Tobacco use.  Drug use.  Emotional well-being.  Home and relationship well-being.  Sexual activity.  Eating habits.  Work and work environment.  Method of birth control.  Menstrual cycle.  Pregnancy history. Screening You may have the following tests or measurements:  Height, weight, and BMI.  Diabetes screening. This is done by checking your blood sugar (glucose) after you have not eaten for a while (fasting).  Blood pressure.  Lipid and cholesterol levels. These may be checked every 5 years starting at age 20.  Skin check.  Hepatitis C blood test.  Hepatitis B blood test.  Sexually transmitted disease (STD) testing.  BRCA-related cancer screening. This may be done if you have a family history of breast, ovarian, tubal, or peritoneal cancers.  Pelvic exam and Pap test. This may be done every 3 years starting at age 21. Starting at age 30, this may be done  every 5 years if you have a Pap test in combination with an HPV test. Discuss your test results, treatment options, and if necessary, the need for more tests with your health care provider. Vaccines Your health care provider may recommend certain vaccines, such as:  Influenza vaccine. This is recommended every year.  Tetanus, diphtheria, and acellular pertussis (Tdap, Td) vaccine. You may need a Td booster every 10 years.  Varicella vaccine. You may need this if you have not been vaccinated.  HPV vaccine. If you are 26 or younger, you may need three doses over 6 months.  Measles, mumps, and rubella (MMR) vaccine. You may need at least one dose of MMR. You may also need a second dose.  Pneumococcal 13-valent conjugate (PCV13) vaccine. You may need this if you have certain conditions and were not previously vaccinated.  Pneumococcal polysaccharide (PPSV23) vaccine. You may need one or two doses if you smoke cigarettes or if you have certain conditions.  Meningococcal vaccine. One dose is recommended if you are age 19-21 years and a first-year college student living in a residence hall, or if you have one of several medical conditions. You may also need additional booster doses.  Hepatitis A vaccine. You may need this if you have certain conditions or if you travel or work in places where you may be exposed to hepatitis A.  Hepatitis B vaccine. You may need this if you have certain conditions or if you travel or work in places where you may be exposed to hepatitis B.  Haemophilus influenzae type b (Hib) vaccine. You may need this if   you have certain risk factors. Talk to your health care provider about which screenings and vaccines you need and how often you need them. This information is not intended to replace advice given to you by your health care provider. Make sure you discuss any questions you have with your health care provider. Document Released: 06/06/2001 Document Revised:  11/21/2016 Document Reviewed: 02/09/2015 Elsevier Interactive Patient Education  2019 [employer].  Kegel Exercises Kegel exercises help strengthen the muscles that support the rectum, vagina, small intestine, bladder, and uterus. Doing Kegel exercises can help:  Improve bladder and bowel control.  Improve sexual response.  Reduce problems and discomfort during pregnancy. Kegel exercises involve squeezing your pelvic floor muscles, which are the same muscles you squeeze when you try to stop the flow of urine. The exercises can be done while sitting, standing, or lying down, but it is best to vary your position. Exercises 1. Squeeze your pelvic floor muscles tight. You should feel a tight lift in your rectal area. If you are a female, you should also feel a tightness in your vaginal area. Keep your stomach, buttocks, and legs relaxed. 2. Hold the muscles tight for up to 10 seconds. 3. Relax your muscles. Repeat this exercise 50 times a day or as many times as told by your health care provider. Continue to do this exercise for at least 4-6 weeks or for as long as told by your health care provider. This information is not intended to replace advice given to you by your health care provider. Make sure you discuss any questions you have with your health care provider. Document Released: 03/27/2012 Document Revised: 08/21/2016 Document Reviewed: 02/28/2015 Elsevier Interactive Patient Education  2019 [employer].

## 2018-05-02 NOTE — Addendum Note (Signed)
Addended by: Rosine Beat L on: 05/02/2018 11:57 AM   Modules accepted: Orders

## 2018-05-03 ENCOUNTER — Other Ambulatory Visit: Payer: Self-pay | Admitting: Obstetrics and Gynecology

## 2018-05-03 LAB — CBC
HEMATOCRIT: 35.9 % (ref 34.0–46.6)
Hemoglobin: 11.9 g/dL (ref 11.1–15.9)
MCH: 26.7 pg (ref 26.6–33.0)
MCHC: 33.1 g/dL (ref 31.5–35.7)
MCV: 81 fL (ref 79–97)
PLATELETS: 300 10*3/uL (ref 150–450)
RBC: 4.46 x10E6/uL (ref 3.77–5.28)
RDW: 14.4 % (ref 11.7–15.4)
WBC: 6.5 10*3/uL (ref 3.4–10.8)

## 2018-05-03 LAB — COMPREHENSIVE METABOLIC PANEL
ALT: 8 IU/L (ref 0–32)
AST: 10 IU/L (ref 0–40)
Albumin/Globulin Ratio: 1.4 (ref 1.2–2.2)
Albumin: 3.8 g/dL (ref 3.5–5.5)
Alkaline Phosphatase: 88 IU/L (ref 39–117)
BUN / CREAT RATIO: 13 (ref 9–23)
BUN: 11 mg/dL (ref 6–20)
Bilirubin Total: <0.2 mg/dL (ref 0.0–1.2)
CO2: 24 mmol/L (ref 20–29)
CREATININE: 0.85 mg/dL (ref 0.57–1.00)
Calcium: 8.8 mg/dL (ref 8.7–10.2)
Chloride: 103 mmol/L (ref 96–106)
GFR calc Af Amer: 103 mL/min/{1.73_m2} (ref >59)
GFR calc non Af Amer: 89 mL/min/{1.73_m2} (ref >59)
GLOBULIN, TOTAL: 2.7 g/dL (ref 1.5–4.5)
GLUCOSE: 76 mg/dL (ref 65–99)
Potassium: 4.4 mmol/L (ref 3.5–5.2)
SODIUM: 141 mmol/L (ref 134–144)
Total Protein: 6.5 g/dL (ref 6.0–8.5)

## 2018-05-03 LAB — LIPID PANEL
CHOLESTEROL TOTAL: 192 mg/dL (ref 100–199)
Chol/HDL Ratio: 3 ratio (ref 0.0–4.4)
HDL: 63 mg/dL (ref >39)
LDL Calculated: 96 mg/dL (ref 0–99)
Triglycerides: 165 mg/dL — ABNORMAL HIGH (ref 0–149)
VLDL Cholesterol Cal: 33 mg/dL (ref 5–40)

## 2018-05-03 LAB — HEMOGLOBIN A1C
ESTIMATED AVERAGE GLUCOSE: 114 mg/dL
HEMOGLOBIN A1C: 5.6 % (ref 4.8–5.6)

## 2018-05-03 LAB — VITAMIN D 25 HYDROXY (VIT D DEFICIENCY, FRACTURES): Vit D, 25-Hydroxy: 25.6 ng/mL — ABNORMAL LOW (ref 30.0–100.0)

## 2018-05-03 LAB — TSH: TSH: 0.877 u[IU]/mL (ref 0.450–4.500)

## 2018-05-03 LAB — FERRITIN: Ferritin: 12 ng/mL — ABNORMAL LOW (ref 15–150)

## 2018-05-03 MED ORDER — VITAMIN D (ERGOCALCIFEROL) 1.25 MG (50000 UNIT) PO CAPS: 50000.0000 [IU] | ORAL_CAPSULE | ORAL | 1 refills | Status: DC

## 2018-05-03 MED ORDER — FUSION PLUS PO CAPS: 1.0000 | ORAL_CAPSULE | Freq: Every day | ORAL | 1 refills | Status: DC

## 2018-05-06 LAB — CYTOLOGY - PAP
Diagnosis: NEGATIVE
HPV: NOT DETECTED

## 2018-05-21 ENCOUNTER — Encounter: Payer: Self-pay | Admitting: Medical

## 2018-05-21 ENCOUNTER — Ambulatory Visit: Payer: Self-pay | Admitting: Medical

## 2018-05-21 VITALS — BP 137/77 | HR 85 | Temp 99.1°F | Resp 16 | Wt 198.4 lb

## 2018-05-21 DIAGNOSIS — H6502 Acute serous otitis media, left ear: Secondary | ICD-10-CM

## 2018-05-21 DIAGNOSIS — H1031 Unspecified acute conjunctivitis, right eye: Secondary | ICD-10-CM

## 2018-05-21 DIAGNOSIS — H6993 Unspecified Eustachian tube disorder, bilateral: Secondary | ICD-10-CM

## 2018-05-21 DIAGNOSIS — H6983 Other specified disorders of Eustachian tube, bilateral: Secondary | ICD-10-CM

## 2018-05-21 MED ORDER — PREDNISONE 10 MG (21) PO TBPK: ORAL_TABLET | ORAL | 0 refills | Status: DC

## 2018-05-21 MED ORDER — AZITHROMYCIN 250 MG PO TABS: ORAL_TABLET | ORAL | 0 refills | Status: DC

## 2018-05-21 MED ORDER — GENTAMICIN SULFATE 0.3 % OP SOLN: 1.0000 [drp] | OPHTHALMIC | 0 refills | Status: DC

## 2018-05-21 NOTE — Patient Instructions (Addendum)
Eustachian Tube Dysfunction  Eustachian tube dysfunction refers to a condition in which a blockage develops in the narrow passage that connects the middle ear to the back of the nose (eustachian tube). The eustachian tube regulates air pressure in the middle ear by letting air move between the ear and nose. It also helps to drain fluid from the middle ear space. Eustachian tube dysfunction can affect one or both ears. When the eustachian tube does not function properly, air pressure, fluid, or both can build up in the middle ear. What are the causes? This condition occurs when the eustachian tube becomes blocked or cannot open normally. Common causes of this condition include:  Ear infections.  Colds and other infections that affect the nose, mouth, and throat (upper respiratory tract).  Allergies.  Irritation from cigarette smoke.  Irritation from stomach acid coming up into the esophagus (gastroesophageal reflux). The esophagus is the tube that carries food from the mouth to the stomach.  Sudden changes in air pressure, such as from descending in an airplane or scuba diving.  Abnormal growths in the nose or throat, such as: ? Growths that line the nose (nasal polyps). ? Abnormal growth of cells (tumors). ? Enlarged tissue at the back of the throat (adenoids). What increases the risk? You are more likely to develop this condition if:  You smoke.  You are overweight.  You are a child who has: ? Certain birth defects of the mouth, such as cleft palate. ? Large tonsils or adenoids. What are the signs or symptoms? Common symptoms of this condition include:  A feeling of fullness in the ear.  Ear pain.  Clicking or popping noises in the ear.  Ringing in the ear.  Hearing loss.  Loss of balance.  Dizziness. Symptoms may get worse when the air pressure around you changes, such as when you travel to an area of high elevation, fly on an airplane, or go scuba diving. How is  this diagnosed? This condition may be diagnosed based on:  Your symptoms.  A physical exam of your ears, nose, and throat.  Tests, such as those that measure: ? The movement of your eardrum (tympanogram). ? Your hearing (audiometry). How is this treated? Treatment depends on the cause and severity of your condition.  In mild cases, you may relieve your symptoms by moving air into your ears. This is called "popping the ears."  In more severe cases, or if you have symptoms of fluid in your ears, treatment may include: ? Medicines to relieve congestion (decongestants). ? Medicines that treat allergies (antihistamines). ? Nasal sprays or ear drops that contain medicines that reduce swelling (steroids). ? A procedure to drain the fluid in your eardrum (myringotomy). In this procedure, a small tube is placed in the eardrum to:  Drain the fluid.  Restore the air in the middle ear space. ? A procedure to insert a balloon device through the nose to inflate the opening of the eustachian tube (balloon dilation). Follow these instructions at home: Lifestyle  Do not do any of the following until your health care provider approves: ? Travel to high altitudes. ? Fly in airplanes. ? Work in a pressurized cabin or room. ? Scuba dive.  Do not use any products that contain nicotine or tobacco, such as cigarettes and e-cigarettes. If you need help quitting, ask your health care provider.  Keep your ears dry. Wear fitted earplugs during showering and bathing. Dry your ears completely after. General instructions  Take over-the-counter   and prescription medicines only as told by your health care provider.  Use techniques to help pop your ears as recommended by your health care provider. These may include: ? Chewing gum. ? Yawning. ? Frequent, forceful swallowing. ? Closing your mouth, holding your nose closed, and gently blowing as if you are trying to blow air out of your nose.  Keep all  follow-up visits as told by your health care provider. This is important. Contact a health care provider if:  Your symptoms do not go away after treatment.  Your symptoms come back after treatment.  You are unable to pop your ears.  You have: ? A fever. ? Pain in your ear. ? Pain in your head or neck. ? Fluid draining from your ear.  Your hearing suddenly changes.  You become very dizzy.  You lose your balance. Summary  Eustachian tube dysfunction refers to a condition in which a blockage develops in the eustachian tube.  It can be caused by ear infections, allergies, inhaled irritants, or abnormal growths in the nose or throat.  Symptoms include ear pain, hearing loss, or ringing in the ears.  Mild cases are treated with maneuvers to unblock the ears, such as yawning or ear popping.  Severe cases are treated with medicines. Surgery may also be done (rare). This information is not intended to replace advice given to you by your health care provider. Make sure you discuss any questions you have with your health care provider. Document Released: 05/07/2015 Document Revised: 07/31/2017 Document Reviewed: 07/31/2017 Elsevier Interactive Patient Education  2019 [employer] Otitis Media, Adult  Otitis media means that the middle ear is red and swollen (inflamed) and full of fluid. The condition usually goes away on its own. Follow these instructions at home:  Take over-the-counter and prescription medicines only as told by your doctor.  If you were prescribed an antibiotic medicine, take it as told by your doctor. Do not stop taking the antibiotic even if you start to feel better.  Keep all follow-up visits as told by your doctor. This is important. Contact a doctor if:  You have bleeding from your nose.  There is a lump on your neck.  You are not getting better in 5 days.  You feel worse instead of better. Get help right away if:  You have pain that is not helped  with medicine.  You have swelling, redness, or pain around your ear.  You get a stiff neck.  You cannot move part of your face (paralyzed).  You notice that the bone behind your ear hurts when you touch it.  You get a very bad headache. Summary  Otitis media means that the middle ear is red, swollen, and full of fluid.  This condition usually goes away on its own. In some cases, treatment may be needed.  If you were prescribed an antibiotic medicine, take it as told by your doctor. This information is not intended to replace advice given to you by your health care provider. Make sure you discuss any questions you have with your health care provider. Document Released: 09/27/2007 Document Revised: 05/01/2016 Document Reviewed: 05/01/2016 Elsevier Interactive Patient Education  2019 [employer]  Viral Conjunctivitis, Adult  Viral conjunctivitis is an inflammation of the clear membrane that covers the white part of your eye and the inner surface of your eyelid (conjunctiva). The inflammation is caused by a viral infection. The blood vessels in the conjunctiva become inflamed, causing the eye to become red or  pink, and often itchy. Viral conjunctivitis can be easily passed from one person to another (is contagious). This condition is often called pink eye. What are the causes? This condition is caused by a virus. A virus is a type of contagious germ. It can be spread by touching objects that have been contaminated with the virus, such as doorknobs or towels. It can also be passed through droplets, such as from coughing or sneezing. What are the signs or symptoms? Symptoms of this condition include:  Eye redness.  Tearing or watery eyes.  Itchy and irritated eyes.  Burning feeling in the eyes.  Clear drainage from the eye.  Swollen eyelids.  A gritty feeling in the eye.  Light sensitivity. This condition often occurs with other symptoms, such as a fever, nausea, or a  rash. How is this diagnosed? This condition is diagnosed with a medical history and physical exam. If you have discharge from your eye, the discharge may be tested to rule out other causes of conjunctivitis. How is this treated? Viral conjunctivitis does not respond to medicines that kill bacteria (antibiotics). Treatment for viral conjunctivitis is directed at stopping a bacterial infection from developing in addition to the viral infection. Treatment also aims to relieve your symptoms, such as itching. This may be done with antihistamine drops or other eye medicines. Rarely, steroid eye drops or antiviral medicines may be prescribed. Follow these instructions at home: Medicines   Take or apply over-the-counter and prescription medicines only as told by your health care provider.  Be very careful to avoid touching the edge of the eyelid with the eye drop bottle or ointment tube when applying medicines to the affected eye. Being careful this way will stop you from spreading the infection to the other eye or to other people. Eye care  Avoid touching or rubbing your eyes.  Apply a warm, wet, clean washcloth to your eye for 10-20 minutes, 3-4 times per day or as told by your health care provider.  If you wear contact lenses, do not wear them until the inflammation is gone and your health care provider says it is safe to wear them again. Ask your health care provider how to sterilize or replace your contact lenses before using them again. Wear glasses until you can resume wearing contacts.  Avoid wearing eye makeup until the inflammation is gone. Throw away any old eye cosmetics that may be contaminated.  Gently wipe away any drainage from your eye with a warm, wet washcloth or a cotton ball. General instructions  Change or wash your pillowcase every day or as told by your health care provider.  Do not share towels, pillowcases, washcloths, eye makeup, makeup brushes, contact lenses, or  glasses. This may spread the infection.  Wash your hands often with soap and water. Use paper towels to dry your hands. If soap and water are not available, use hand sanitizer.  Try to avoid contact with other people for one week or as told by your health care provider. Contact a health care provider if:  Your symptoms do not improve with treatment or they get worse.  You have increased pain.  Your vision becomes blurry.  You have a fever.  You have facial pain, redness, or swelling.  You have yellow or green drainage coming from your eye.  You have new symptoms. This information is not intended to replace advice given to you by your health care provider. Make sure you discuss any questions you have with your  health care provider. Document Released: 07/01/2002 Document Revised: 11/06/2015 Document Reviewed: 10/26/2015 Elsevier Interactive Patient Education  2019 [employer].

## 2018-05-21 NOTE — Progress Notes (Signed)
Subjective:    Patient ID: Angela Knox, female    DOB: 11-24-1980, 38 y.o.   MRN: [id number]  HPI  Symptoms of intially sore throat same today and now with cough. Non jproductive. Head congestion , trouble hearing in both ears but no pain. Right eye red,not painful or itchy, no discharge, denies visual changes. Does wear contact haven't worn them in 2 weeks.   Blood pressure 137/77, pulse 85, temperature 99.1 F (37.3 C), temperature source Tympanic, resp. rate 16, weight 198 lb 6.4 oz (90 kg), last menstrual period 04/24/2018, SpO2 100 %, not currently breastfeeding.Denies fever shortness of breath or chest pain. Is taking OTC Flonase and Zyrtec daily.   Has a 18 month old sick with sinusitis,  Rhinosinitis on amoxil. Blood pressure 137/77, pulse 85, temperature 99.1 F (37.3 C), temperature source Tympanic, resp. rate 16, weight 198 lb 6.4 oz (90 kg), last menstrual period 04/24/2018, SpO2 100 %, not currently breastfeeding. Allergies  Allergen Reactions  . Amoxicillin     Nausea and vomiting on 03/15/18  . Ciprofloxacin Other (See Comments)    Body aches  . Sertraline Hcl Itching  . Asa [Aspirin] Itching and Rash  . Not breast feeding.   Review of Systems  Constitutional: Negative for activity change, appetite change, chills, fatigue and fever.  HENT: Positive for congestion, postnasal drip, rhinorrhea (clear), sneezing, sore throat and voice change (little hoarse). Negative for ear discharge, ear pain, sinus pressure and sinus pain.   Eyes: Positive for redness. Negative for photophobia, pain, discharge, itching and visual disturbance.  Respiratory: Positive for cough. Negative for chest tightness, shortness of breath and wheezing.   Cardiovascular: Negative for chest pain, palpitations and leg swelling.  Gastrointestinal: Negative for abdominal pain, diarrhea, nausea and vomiting.  Endocrine: Negative for polydipsia, polyphagia and polyuria.  Genitourinary: Negative for  dysuria.  Musculoskeletal: Negative for myalgias.  Skin: Negative for rash.  Allergic/Immunologic: Positive for environmental allergies. Negative for food allergies.  Neurological: Negative for dizziness, syncope, light-headedness and headaches.  Hematological: Negative for adenopathy.  Psychiatric/Behavioral: Negative for behavioral problems, confusion, self-injury and suicidal ideas. The patient is not nervous/anxious.        Objective:   Physical Exam Vitals signs and nursing note reviewed.  Constitutional:      Appearance: Normal appearance.  HENT:     Head: Normocephalic and atraumatic.     Jaw: There is normal jaw occlusion.     Right Ear: A middle ear effusion is present.     Left Ear: A middle ear effusion is present. Tympanic membrane is erythematous.     Nose: Congestion present. No rhinorrhea.     Right Turbinates: Not enlarged, swollen or pale.     Left Turbinates: Not enlarged, swollen or pale.     Mouth/Throat:     Lips: Pink.     Mouth: Mucous membranes are moist.     Pharynx: Oropharynx is clear. Uvula midline. Posterior oropharyngeal erythema (mild) present. No pharyngeal swelling, oropharyngeal exudate or uvula swelling.     Tonsils: Swelling: 2+ on the right. 2+ on the left.  Eyes:     General:        Right eye: Discharge present.        Left eye: No discharge.     Extraocular Movements: Extraocular movements intact.     Pupils: Pupils are equal, round, and reactive to light.  Neck:     Musculoskeletal: Normal range of motion and neck supple.  Cardiovascular:  Rate and Rhythm: Normal rate and regular rhythm.     Heart sounds: Normal heart sounds.  Pulmonary:     Effort: Pulmonary effort is normal.     Breath sounds: Normal breath sounds.  Lymphadenopathy:     Cervical: No cervical adenopathy.  Skin:    General: Skin is warm and dry.  Neurological:     General: No focal deficit present.     Mental Status: She is alert and oriented to person, place,  and time.  Psychiatric:        Mood and Affect: Mood normal.        Behavior: Behavior normal.        Thought Content: Thought content normal.        Judgment: Judgment normal.           Assessment & Plan:  Otitis media of the left possible the right Eustachian tube dysfunciton bilaterally Conjunctivitis right eye most likely viral  Meds ordered this encounter  Medications  . azithromycin (ZITHROMAX) 250 MG tablet    Sig: Take 2 tablets by mouth today then one tablet days 2-5 , take with food.    Dispense:  6 tablet    Refill:  0  . predniSONE (STERAPRED UNI-PAK 21 TAB) 10 MG (21) TBPK tablet    Sig: Take  5 tablets by mouth today  Then 5 tablets tomorrow then one less everyday there after. Take with food.    Dispense:  21 tablet    Refill:  0  . gentamicin (GARAMYCIN) 0.3 % ophthalmic solution    Sig: Place 1 drop into the right eye every 4 (four) hours. While away  5-7    Dispense:  5 mL    Refill:  0  wash hand before and after using eye drops. Return in 3-5 days if not improving or worsening.. Patient verbalizes understanding and has no questions at discharge.

## 2018-06-04 ENCOUNTER — Ambulatory Visit: Payer: Self-pay | Admitting: Medical

## 2018-06-04 ENCOUNTER — Encounter: Payer: Self-pay | Admitting: Medical

## 2018-06-04 VITALS — BP 121/83 | HR 112 | Temp 99.0°F | Resp 18 | Wt 200.0 lb

## 2018-06-04 DIAGNOSIS — R69 Illness, unspecified: Principal | ICD-10-CM

## 2018-06-04 DIAGNOSIS — J111 Influenza due to unidentified influenza virus with other respiratory manifestations: Secondary | ICD-10-CM

## 2018-06-04 MED ORDER — OSELTAMIVIR PHOSPHATE 75 MG PO CAPS: 75.0000 mg | ORAL_CAPSULE | Freq: Two times a day (BID) | ORAL | 0 refills | Status: DC

## 2018-06-04 NOTE — Progress Notes (Signed)
Subjective:    Patient ID: Angela PallJennifer Knox, female    DOB: Jul 16, 1980, 38 y.o.   MRN: [id number]  HPI 38 yo female in non acute distress. Started with symptoms of nasal congestion, sneezing , runny nose clear, no cough, mild body aches.Had Influenza A in Novemver."This feels similar but not as bad.Denies , shortness of breath or chest pain.   Blood pressure 121/83, pulse (!) 112, temperature 99 F (37.2 C), temperature source Tympanic, resp. rate 18, weight 200 lb (90.7 kg), SpO2 100 %, not currently breastfeeding. Allergies  Allergen Reactions  . Amoxicillin     Nausea and vomiting on 03/15/18  . Ciprofloxacin Other (See Comments)    Body aches  . Sertraline Hcl Itching  . Asa [Aspirin] Itching and Rash    Review of Systems  Constitutional: Positive for chills, fatigue and fever.  HENT: Positive for congestion, ear pain (left), postnasal drip, rhinorrhea (clear), sinus pressure, sinus pain (forehead), sneezing and voice change (deeper). Negative for sore throat.   Eyes: Negative for discharge and itching.  Respiratory: Negative for cough, shortness of breath and wheezing.   Cardiovascular: Negative for chest pain.  Gastrointestinal: Positive for nausea (yesterday better today). Negative for diarrhea and vomiting.  Endocrine: Negative for polydipsia, polyphagia and polyuria.  Genitourinary: Negative for dysuria.  Musculoskeletal: Positive for myalgias.  Skin: Negative for rash.  Allergic/Immunologic: Positive for environmental allergies. Negative for food allergies.  Neurological: Positive for headaches (frontal). Negative for dizziness, syncope and light-headedness.  Hematological: Negative for adenopathy.  Psychiatric/Behavioral: Negative for behavioral problems, confusion, self-injury and suicidal ideas.   Flu vaccine 11/2018    Objective:   Physical Exam Vitals signs and nursing note reviewed.  Constitutional:      Appearance: Normal appearance.  HENT:     Head:  Normocephalic and atraumatic.     Right Ear: Ear canal and external ear normal.     Left Ear: Ear canal and external ear normal.     Nose: Congestion and rhinorrhea present.     Mouth/Throat:     Mouth: Mucous membranes are moist.     Pharynx: Oropharynx is clear. No oropharyngeal exudate or posterior oropharyngeal erythema.  Eyes:     Extraocular Movements: Extraocular movements intact.     Conjunctiva/sclera: Conjunctivae normal.     Pupils: Pupils are equal, round, and reactive to light.  Neck:     Musculoskeletal: Normal range of motion and neck supple.  Cardiovascular:     Rate and Rhythm: Normal rate and regular rhythm.  Pulmonary:     Effort: Pulmonary effort is normal.     Breath sounds: Normal breath sounds.  Musculoskeletal: Normal range of motion.  Lymphadenopathy:     Cervical: No cervical adenopathy.  Skin:    General: Skin is warm and dry.  Neurological:     General: No focal deficit present.     Mental Status: She is alert and oriented to person, place, and time.  Psychiatric:        Mood and Affect: Mood normal.        Behavior: Behavior normal.        Thought Content: Thought content normal.        Judgment: Judgment normal.           Assessment & Plan:  Influenza like illness. Meds ordered this encounter  Medications  . oseltamivir (TAMIFLU) 75 MG capsule    Sig: Take 1 capsule (75 mg total) by mouth 2 (two) times daily.  Dispense:  10 capsule    Refill:  0  given mask and hygiene recommendations to not spread Flu to family. If constant ear pain to  Call for antibiotics. OTC Motrin or Tylenol for fever/chills/aches per package directions.   Rest , increase fluids. She verbalizes understanding and has no questions at discharge.

## 2018-06-05 ENCOUNTER — Encounter: Payer: Self-pay | Admitting: Medical

## 2018-06-10 ENCOUNTER — Ambulatory Visit: Payer: Self-pay | Admitting: Nurse Practitioner

## 2018-06-10 ENCOUNTER — Encounter: Payer: Self-pay | Admitting: Nurse Practitioner

## 2018-06-10 VITALS — BP 123/71 | HR 105 | Temp 98.8°F | Resp 18

## 2018-06-10 DIAGNOSIS — J329 Chronic sinusitis, unspecified: Secondary | ICD-10-CM

## 2018-06-10 DIAGNOSIS — B9789 Other viral agents as the cause of diseases classified elsewhere: Secondary | ICD-10-CM

## 2018-06-10 DIAGNOSIS — H6504 Acute serous otitis media, recurrent, right ear: Secondary | ICD-10-CM

## 2018-06-10 MED ORDER — SULFAMETHOXAZOLE-TRIMETHOPRIM 800-160 MG PO TABS: 1.0000 | ORAL_TABLET | Freq: Two times a day (BID) | ORAL | 0 refills | Status: DC

## 2018-06-10 NOTE — Progress Notes (Signed)
   Subjective:    Patient ID: Angela Knox, female    DOB: 09-Sep-1980, 38 y.o.   MRN: [id number]  HPI Angela Knox returns to the clinic today with c/o right ear pain; which is recurrent and dark nasal green discharge x 1 week. Denies facial pain but pressure behind eyes. She was diagnosed with influenza 06/04/2018 per clinical presentation and put on Tamiflu last week. For her current symptoms no self treatment except adequate hydration. Per chart and patient has had recurrent ear infections treated with multiple antibiotics in the past 3 months and has an appt with ENT next week.  On Junel for contraception and followed by gyn for this.   Review of Systems  Constitutional: Negative for fever.  HENT: Positive for congestion and ear pain.   Gastrointestinal: Negative for diarrhea, nausea and vomiting.       Objective:   Physical Exam Vitals signs reviewed.  Constitutional:      Appearance: Normal appearance. She is well-developed.  HENT:     Head: Normocephalic and atraumatic.     Comments: No maxillary or frontal sinus tenderness    Right Ear: Ear canal normal.     Left Ear: Ear canal normal.     Ears:     Comments: Right dull TM with erythema noted. Left TM with serous fluid behind intact TM.    Nose: Nose normal.     Mouth/Throat:     Mouth: Mucous membranes are moist.     Comments: Mildly injected pharynx Neck:     Musculoskeletal: Normal range of motion and neck supple. No muscular tenderness.  Cardiovascular:     Rate and Rhythm: Normal rate and regular rhythm.     Heart sounds: Normal heart sounds.  Pulmonary:     Effort: Pulmonary effort is normal. No respiratory distress.     Breath sounds: Normal breath sounds.  Abdominal:     General: Bowel sounds are normal.     Palpations: Abdomen is soft.  Musculoskeletal: Normal range of motion.  Lymphadenopathy:     Cervical: Cervical adenopathy present.  Skin:    General: Skin is warm and dry.  Neurological:     Mental  Status: She is alert and oriented to person, place, and time.  Psychiatric:        Mood and Affect: Mood normal.     Has been on Keflex and Zpack in the last 2 months     Assessment & Plan:

## 2018-06-10 NOTE — Patient Instructions (Addendum)
Britt Boozer it was nice meeting you today! Hold your Zyrtec, start over the counter Fexofenadine and resume your Flonase (1 spray nasally twice daily) I am giving you a prescription for antibiotic because you do have an ear infection, but with changing treatment your body may resolve the ear infection without having to take another antibiotic; which your last one was 3 weeks ago Plenty of fluids and rest encouraged Keep your appt with ENT and follow their advice

## 2018-06-18 DIAGNOSIS — H6980 Other specified disorders of Eustachian tube, unspecified ear: Secondary | ICD-10-CM | POA: Diagnosis not present

## 2018-06-18 DIAGNOSIS — H9209 Otalgia, unspecified ear: Secondary | ICD-10-CM | POA: Diagnosis not present

## 2018-06-21 ENCOUNTER — Ambulatory Visit: Payer: Self-pay | Admitting: Nurse Practitioner

## 2018-06-21 ENCOUNTER — Encounter: Payer: Self-pay | Admitting: Nurse Practitioner

## 2018-06-21 ENCOUNTER — Other Ambulatory Visit: Payer: Self-pay

## 2018-06-21 VITALS — BP 115/67 | HR 99 | Temp 98.3°F | Resp 16 | Ht 66.0 in | Wt 198.0 lb

## 2018-06-21 DIAGNOSIS — S161XXA Strain of muscle, fascia and tendon at neck level, initial encounter: Secondary | ICD-10-CM

## 2018-06-21 MED ORDER — CYCLOBENZAPRINE HCL 10 MG PO TABS: ORAL_TABLET | ORAL | 0 refills | Status: DC

## 2018-06-21 MED ORDER — NAPROXEN 500 MG PO TABS: ORAL_TABLET | ORAL | 0 refills | Status: DC

## 2018-06-21 NOTE — Progress Notes (Signed)
   Subjective:    Patient ID: Angela Knox, female    DOB: 1981/03/09, 38 y.o.   MRN: [id number]  HPI Emry comes to the clinic this morning with complaint of left-sided neck pain x2 weeks.  She reports her husband massages the area she uses heat which does help but the proximal pain still persists.  She denies injury and cannot recall how she developed this or if she slept the wrong way.  Denies any numbness or tingling or chest pain.    Review of Systems  Musculoskeletal: Positive for neck pain. Negative for gait problem and neck stiffness.  Skin: Negative for rash.  Neurological: Negative for weakness and numbness.       Objective:   Physical Exam Constitutional:      Appearance: Normal appearance.  HENT:     Head: Normocephalic and atraumatic.  Neck:     Musculoskeletal: Normal range of motion and neck supple. Muscular tenderness present. No neck rigidity.     Comments: FROM with rotation, hyperextension and hyperflexion. Strength 5/5. Tenderness to right sternocleidomastoid with no right side tenderness or tenderness over the trapezius. Musculoskeletal: Normal range of motion.  Skin:    General: Skin is warm and dry.     Comments: No edema or erythema  Neurological:     General: No focal deficit present.     Mental Status: She is alert and oriented to person, place, and time.  Psychiatric:        Mood and Affect: Mood normal.           Assessment & Plan:

## 2018-06-21 NOTE — Patient Instructions (Addendum)
Strain of sternocleidomastoid muscle, initial encounter PLAN: -Get over the counter salonpas pads and apply at night -Discussed ASA and reports has taken motrin or other NSAIDS with no side effects. -Continue heat and massage -Discussed cyclobenazeprine can cause drowsiness so take right before bed -Encouraged patient to call the office or primary care doctor for an appointment if no improvement in symptoms or if symptoms change or worsen after 72 hours of planned treatment. Patient verbalized understanding of all instructions given/reviewed and has no further questions or concerns at this time.

## 2018-06-25 ENCOUNTER — Encounter: Payer: Self-pay | Admitting: Medical

## 2018-06-25 ENCOUNTER — Ambulatory Visit: Payer: Self-pay | Admitting: Medical

## 2018-06-25 VITALS — BP 119/60 | HR 97 | Temp 99.0°F | Resp 16 | Wt 198.2 lb

## 2018-06-25 DIAGNOSIS — H6982 Other specified disorders of Eustachian tube, left ear: Secondary | ICD-10-CM

## 2018-06-25 DIAGNOSIS — J0101 Acute recurrent maxillary sinusitis: Secondary | ICD-10-CM

## 2018-06-25 DIAGNOSIS — H6504 Acute serous otitis media, recurrent, right ear: Secondary | ICD-10-CM

## 2018-06-25 MED ORDER — DOXYCYCLINE HYCLATE 100 MG PO TABS: 100.0000 mg | ORAL_TABLET | Freq: Two times a day (BID) | ORAL | 0 refills | Status: DC

## 2018-06-25 NOTE — Progress Notes (Signed)
Subjective:    Patient ID: Angela Knox, female    DOB: Mar 25, 1981, 38 y.o.   MRN: [id number]  HPI 38 yo female in non acute distress.  Sore throat since yesterday 9/10.. Bilateral ear pain. Chills yesterday did not take temp. Seen by Dr. Jenne Campus last week., recommended she self autoinflate several times a day.  Taking Zyrtec daily and using Flonase daily. Took  Tylenol  650 mg at 7 am . Denes chest pain or shortness of breath. Not sure about fever, has had chills.   Seen 2/17 for otitis media.Influenza Like Illness  on 06/04/2018. Blood pressure 119/60, pulse 97, temperature 99 F (37.2 C), temperature source Tympanic, resp. rate 16, weight 198 lb 3.2 oz (89.9 kg), SpO2 100 %, not currently breastfeeding. Allergies  Allergen Reactions  . Amoxicillin     Nausea and vomiting on 03/15/18  . Ciprofloxacin Other (See Comments)    Body aches  . Sertraline Hcl Itching  . Asa [Aspirin] Itching and Rash      Review of Systems  Constitutional: Positive for chills and fever (unsure).  HENT: Positive for ear pain (both painful), sore throat, trouble swallowing and voice change (raspy). Negative for congestion, ear discharge, postnasal drip, rhinorrhea, sinus pressure, sinus pain and sneezing.   Eyes: Negative for discharge and itching.  Respiratory: Negative for cough and shortness of breath.   Cardiovascular: Negative for chest pain.  Gastrointestinal: Negative for abdominal pain, diarrhea, nausea and vomiting.  Genitourinary: Negative for dysuria.  Musculoskeletal: Negative for myalgias.  Skin: Negative for rash.  Allergic/Immunologic: Positive for environmental allergies. Negative for food allergies.  Neurological: Negative for dizziness, syncope, light-headedness and headaches.  Hematological: Positive for adenopathy (in the neck).  Psychiatric/Behavioral: Negative for behavioral problems, confusion, self-injury and suicidal ideas.   Clears throat "all the time" she states    Objective:   Physical Exam Vitals signs and nursing note reviewed.  Constitutional:      Appearance: She is well-developed.  HENT:     Head: Normocephalic and atraumatic.     Right Ear: Ear canal normal. Tympanic membrane is erythematous.     Left Ear: Ear canal normal. A middle ear effusion is present.     Nose:     Comments: Swelling and erythema of the left turbinate    Mouth/Throat:     Mouth: Mucous membranes are moist.     Pharynx: Oropharynx is clear. Uvula midline. No pharyngeal swelling, posterior oropharyngeal erythema or uvula swelling.     Tonsils: No tonsillar exudate or tonsillar abscesses. Swelling: 2+ on the right. 2+ on the left.     Comments: Vascular looking  Uvula, no swelling or redness of uvula or posterior pharynx. Eyes:     Conjunctiva/sclera: Conjunctivae normal.     Pupils: Pupils are equal, round, and reactive to light.  Neck:     Musculoskeletal: Normal range of motion and neck supple.  Cardiovascular:     Rate and Rhythm: Normal rate and regular rhythm.     Heart sounds: Normal heart sounds.  Pulmonary:     Effort: Pulmonary effort is normal.     Breath sounds: Normal breath sounds.  Lymphadenopathy:     Cervical: Cervical adenopathy (right more then left submandibular) present.  Skin:    General: Skin is warm and dry.  Neurological:     General: No focal deficit present.     Mental Status: She is alert and oriented to person, place, and time.  Psychiatric:  Mood and Affect: Mood normal.        Behavior: Behavior normal.     cobblestoning back of tongue.vascular looking uvula. No swelling or erythma. Clears throat "all the time" she states.    Assessment & Plan:  Pharyngitis, Otitis Media Right, recurrent, Sinusitis recurrent maxillary, Eustachian tube dysfunction Bilaterally Due allergy to Amoxil , will not do Keflex, due to possible cross reaction..  Continue taking OTC  Zyrtec and  Flonase per package instructions. OTC Motrin or  Tylenol per package directions for pain.  Return in 3-5 days if not improving. Meds ordered this encounter  Medications  . doxycycline (VIBRA-TABS) 100 MG tablet    Sig: Take 1 tablet (100 mg total) by mouth 2 (two) times daily.    Dispense:  20 tablet    Refill:  0  Warned patient of sensitivity to sun while on Doxy. She verbalizes understanding and has no questions at discharge.

## 2018-07-02 ENCOUNTER — Other Ambulatory Visit: Payer: Self-pay | Admitting: *Deleted

## 2018-07-02 ENCOUNTER — Ambulatory Visit: Payer: BLUE CROSS/BLUE SHIELD | Attending: Obstetrics and Gynecology | Admitting: Physical Therapy

## 2018-07-02 ENCOUNTER — Encounter: Payer: Self-pay | Admitting: Physical Therapy

## 2018-07-02 ENCOUNTER — Other Ambulatory Visit: Payer: Self-pay

## 2018-07-02 DIAGNOSIS — R293 Abnormal posture: Secondary | ICD-10-CM | POA: Diagnosis not present

## 2018-07-02 DIAGNOSIS — M6281 Muscle weakness (generalized): Secondary | ICD-10-CM | POA: Insufficient documentation

## 2018-07-02 DIAGNOSIS — R278 Other lack of coordination: Secondary | ICD-10-CM | POA: Diagnosis not present

## 2018-07-02 MED ORDER — ESCITALOPRAM OXALATE 10 MG PO TABS: 10.0000 mg | ORAL_TABLET | Freq: Every day | ORAL | 6 refills | Status: DC

## 2018-07-02 NOTE — Therapy (Signed)
[facility] MAIN [facility] SERVICES [address] [place], [place], [address] Phone: [phone]   Fax:  [phone]  Physical Therapy Treatment  Patient Details  Name: Angela Knox MRN: [id number] Date of Birth: 02-20-1981 Referring Provider (PT): [place], [place]   Encounter Date: 07/02/2018  PT End of Session - 07/02/18 0800    Visit Number  1    Number of Visits  13    Date for PT Re-Evaluation  09/24/18    PT Start Time  0803    PT Stop Time  0855    PT Time Calculation (min)  52 min    Activity Tolerance  Patient tolerated treatment well    Behavior During Therapy  [facility] for tasks assessed/performed       Past Medical History:  Diagnosis Date  . Depression   . Insomnia     Past Surgical History:  Procedure Laterality Date  . APPENDECTOMY      There were no vitals filed for this visit.  Subjective Assessment - 07/02/18 0813    Currently in Pain?  Yes    Pain Score  4     Pain Location  Neck    Pain Orientation  Left    Pain Descriptors / Indicators  Dull;Sore    Pain Type  Chronic pain    Pain Radiating Towards  upper arm on the lateral-posterior aspect    Pain Onset  More than a month ago    Pain Frequency  Constant    Aggravating Factors   holding baby, picking up heavy objects    Pain Relieving Factors  resting    Multiple Pain Sites  Yes    Pain Score  5    Pain Location  Back    Pain Orientation  Lower;Left    Pain Descriptors / Indicators  Aching    Pain Type  Chronic pain    Pain Radiating Towards  none    Pain Onset  More than a month ago    Pain Frequency  Constant    Aggravating Factors   lifting, transfers into bed, standing performing UE tasks    Pain Relieving Factors  rest       [facility] PT Assessment - 07/03/18 0001      Assessment   Medical Diagnosis  PFM Weakness    Referring Provider (PT)  Aura Camps, Melody    Onset Date/Surgical Date  12/23/17    Hand Dominance  Left    Next MD Visit  January  2021    Prior Therapy  No      Precautions   Precautions  None      Restrictions   Weight Bearing Restrictions  No      Balance Screen   Has the patient fallen in the past 6 months  No      Prior Function   Level of Independence  Independent    Vocation  Full time employment    Therapist, nutritional job    Leisure  Photography      [employer] Flags: Denies all. Have you had any night sweats? Unexplained weight loss? Saddle anesthesia? Unexplained changes in bowel or bladder habits?  Patient reports: Onset? Started in September after delivery. Patient had "rough" labor and delivery where baby is "stuck". Patient had vaginal delivery with tearing (unknown). Patient denies any pain/changes in sensation with bowel movements. Patient has incontinence.  Leakage triggers? Walking (> 4 min) brings about minimal leakage. 50% of the  time minimal leakage with coughing/sneezing/laughing. Bladder habits? Patient goes to the bathroom roughly 6x/day. Patient has no difficulty initiating stream. Patient is not aware of leakage as it occurs. # of pads and type? Pantyliners. 2/day.  Amount of leakage? Minimal Bowel movements? Type 4. Patient has no pain, occasional straining. Nocturia? 0x Fluid intake? Coffee 12oz; H20: 3x16/9 oz. Diet soda: 16 oz. Pain? w/ intercourse? No; no sensation during intercourse. w/ gynecological exam? Prior pain with gyn exam and sex. No sensation currently.  What is the problem in the way of in your day-to-day? Menstrual hygiene, sex. Gynecological hx? Pregnancies?  Deliveries? 1 vaginal birth, tear.  Surgical hx? appendectomy Current activities? Photography, baby, homebody What are your goals for therapy? Get sensation back; eliminate incontinence  OBJECTIVE  Posture/Observations:  Sitting: slumped, genu valgum, plantarflexed, forward head, rounded shoulders Standing: forward shoulders, L shoulder significantly more elevated than R, moderate posterior pelvic tilt,  hyperextended knees, mild pronation of B feet  Palpation/Segmental Motion/Joint Play: Tender and hypomobile in thoracic, no radiating complaints Tender and hypermobile in lumbar region, no radiating complaints Right sacral border tender with mobilization, improved with repetition. B gluteal complex tender to palpation, R>L. Region of glute medius producing the most tenderness.  Special tests:   FADDIR (-) BLE FABER (-) BLE Quadrant (+) R (left LBP)  Range of Motion/Flexibilty:  Spine:  Lumbar flexion: painful, WFL Lumbar extension: painful, WFL L sidebending: overpressure painful, 45 degrees R sidebending: painful on left side, 36 degrees R quadrant: painful on left side Hips: All movements WNL and pain free.  Strength/MMT:   RLE LLE  Hip Flexion 5/5 4/5  Hip Extension 4/5 4/5  Hip Abduction  4/5 4/5  Hip Adduction  5/5 5/5  Hip ER  4/5 4/5  Hip IR  4/5 4/5  Knee Extension 5/5 4/5  Knee Flexion 5/5 5/5  Dorsiflexion  5/5 5/5  Plantarflexion (seated) 5/5 5/5  L shoulder flexion 4/5 with patient reporting sensation of "weakness" BUE ER/IR 4-/5   Abdominal:  Palpation: No tenderness to palpation throughout the abdominal region. Diastasis: 1.5 finger width throughout linea alba  Pelvic Floor External Exam: Palpation: Decreased sensation at R ischiocavernosus roughly at the level of the ramus of the ischium. No pain reported on either side. Cough: PFM lifts with cued cough, gluteal and hip adductor contributions noted as well.  Internal Vaginal Exam: deferred on this visit due to history taking and patient education priority Skin integrity:  Introitus Appears:  Strength (PERF):  Symmetry: Palpation: Prolapse visible?: Scar mobility:  Internal Rectal Exam: deferred on this visit due to history taking and patient education priority Strength (PERF): Symmetry: Palpation: Prolapse:  Gait Analysis: Patient has no major gait abnormalities. Tendency to pronate and  rely on maximum joint congruency for controlling knee extension.    Pelvic Floor Outcome Measures: ICIQ-UI, NIH-CPSI Female (provided, patient to return at next visit)  ASSESSMENT Patient is a 38 year old presenting to clinic with chief complaints of changes in sensation in the pelvis s/p labor and delivery as well as mild incontinence. Upon examination, patient demonstrates deficits in BLE strength, PFM strength and coordination, lumbar spine mobility, and pain as evidenced by MMT (BLE gross 4/5), difficulty coordinating PFM lengthening with verbal cueing and breath, compensations from regional muscles during PFM contraction, pain with lumbar forward and lateral flexion as well as extension with decreased and asymmetrical sidebending (L 45 degrees, R 36 degrees and painful). Patient's progress may be limited due to sedentary lifestyle, presence of  insomnia, and increased caregiver demands; however, patient's motivation, level of education, and social support are advantageous. Patient was able to achieve basic understanding of role of PFM in daily activities during today's evaluation. Patient will benefit from continued skilled therapeutic intervention to address deficits in BLE strength, PFM strength and coordination, lumbar spine mobility, and painin order to perform duties of caregiver without pain, increase function, and improve overall QOL.  PT Short Term Goals - 07/03/18 0913      PT SHORT TERM GOAL #1   Title  Patient will demonstrate independence with HEP in order to maximize therapeutic gains and improve carryover from therapy sessions to ADLs in the home and community.    Baseline  IE: not initiated    Time  6    Period  Weeks    Status  New    Target Date  08/14/18      PT SHORT TERM GOAL #2   Title  Patient will decrease worst back pain as reported on NPRS by at least 2 points in order to demonstrate clinically significant reduction in back pain for improved ability to care for child.     Baseline  IE: 5/10    Time  6    Period  Weeks    Status  New    Target Date  08/14/18       PT Long Term Goals - 07/03/18 0918      PT LONG TERM GOAL #1   Title  Patient will decrease worst back pain as reported on NPRS to a 0-1/10 in order to demonstrate full recovery of LBP with improved body mechanics for participation in caregiving and wellness activities.    Baseline  IE: 5/10    Time  12    Period  Weeks    Status  New    Target Date  09/24/18      PT LONG TERM GOAL #2   Title  Patient will report no incidents of urinary incontinence over the course of 3 weeks while coughing/sneezing/laughing/prolonged walking in order to demonstrate improved PFM coordination, strength, and function for improved overall QOL.    Baseline  IE: 50% of cough/sneeze/laugh, walking > 4 min.    Time  12    Period  Weeks    Status  New    Target Date  09/24/18      PT LONG TERM GOAL #3   Title  Patient will demonstrate at least a 7 point reduction on NIH-CPSI Female outcome measure in order to demonstrate clinically significant improvement in pelvic sensation for improved menstrual hygiene and ADLs.     Baseline  IE: provided outcome    Time  12    Period  Weeks    Status  New    Target Date  09/24/18      PT LONG TERM GOAL #4   Title  Patient will demonstrate proper body mechanics independent of cueing for lifting, carrying, and squatting for improved pain free movement during ADLs and participation in caregiving.    Baseline  IE: patterns demonstrate lack of coordination and strength    Time  12    Period  Weeks    Status  New    Target Date  09/24/18      PT LONG TERM GOAL #5   Title  Patient will have improved lumbar motion as evidenced by symmetrical and pain free bilateral sidebending, lumbar flexion, and lumbar extension for improved function and participation in ADLs and  caregiving.    Baseline  IE: LSB 45, RSB 36, pain with all motions    Time  12    Period  Weeks    Status   New    Target Date  09/24/18         Plan - 07/03/18 0905    Clinical Impression Statement  Patient is a 38 year old presenting to clinic with chief complaints of changes in sensation in the pelvis s/p labor and delivery as well as mild incontinence. Upon examination, patient demonstrates deficits in BLE strength, PFM strength and coordination, lumbar spine mobility, and pain as evidenced by MMT (BLE gross 4/5), difficulty coordinating PFM lengthening with verbal cueing and breath, compensations from regional muscles during PFM contraction, pain with lumbar forward and lateral flexion as well as extension with decreased and asymmetrical sidebending (L 45 degrees, R 36 degrees and painful). Patient's progress may be limited due to sedentary lifestyle, presence of insomnia, and increased caregiver demands; however, patient's motivation, level of education, and social support are advantageous. Patient was able to achieve basic understanding of role of PFM in daily activities during today's evaluation. Patient will benefit from continued skilled therapeutic intervention to address deficits in BLE strength, PFM strength and coordination, lumbar spine mobility, and painin order to perform duties of caregiver without pain, increase function, and improve overall QOL.    Personal Factors and Comorbidities  Behavior Pattern;Fitness;Comorbidity 1;Time since onset of injury/illness/exacerbation;Other    Comorbidities  Insomnia; grade 3 tear with vaginal delivery    Examination-Activity Limitations  Locomotion Level;Stand;Squat;Lift;Hygiene/Grooming;Stairs;Caring for Others;Reach Overhead;Carry;Continence;Sleep    Examination-Participation Restrictions  Laundry;Shop;Yard Work;Other;Interpersonal Relationship;Community Activity;Cleaning    Stability/Clinical Decision Making  Evolving/Moderate complexity    Clinical Decision Making  Moderate    Rehab Potential  Good    PT Frequency  1x / week    PT Duration  12  weeks    PT Treatment/Interventions  ADLs/Self Care Home Management;Aquatic Therapy;Biofeedback;Cryotherapy;Electrical Stimulation;Moist Heat;Stair training;Functional mobility training;Gait training;Therapeutic activities;Therapeutic exercise;Balance training;Neuromuscular re-education;Manual techniques;Dry needling;Passive range of motion;Scar mobilization;Patient/family education;Taping;Joint Manipulations;Spinal Manipulations    PT Next Visit Plan  internal exam, thoracolumbar junction mobility    PT Home Exercise Plan  diaphragmatic breathing, sleep hygiene    Consulted and Agree with Plan of Care  Patient        Patient will benefit from skilled therapeutic intervention in order to improve the following deficits and impairments:  Abnormal gait, Impaired sensation, Improper body mechanics, Pain, Decreased scar mobility, Postural dysfunction, Decreased coordination, Decreased range of motion, Decreased strength, Hypomobility, Difficulty walking, Decreased balance, Increased muscle spasms  Visit Diagnosis: Muscle weakness (generalized)  Other lack of coordination  Abnormal posture     Problem List Patient Active Problem List   Diagnosis Date Noted  . Vitamin D deficiency 01/31/2018  . Anemia 01/31/2018  . Depression   . Insomnia    Sheria Lang PT, [place] #[id number] 07/03/2018, 9:10 AM  [facility] MAIN [facility] SERVICES [address] [place], [place], [address] Phone: [phone]   Fax:  [phone]  Name: Angela Knox MRN: [id number] Date of Birth: Jul 26, 1980

## 2018-07-09 ENCOUNTER — Ambulatory Visit: Payer: BLUE CROSS/BLUE SHIELD | Admitting: Physical Therapy

## 2018-07-09 ENCOUNTER — Encounter: Payer: Self-pay | Admitting: Physical Therapy

## 2018-07-09 ENCOUNTER — Other Ambulatory Visit: Payer: Self-pay

## 2018-07-09 DIAGNOSIS — M6281 Muscle weakness (generalized): Secondary | ICD-10-CM | POA: Diagnosis not present

## 2018-07-09 DIAGNOSIS — R278 Other lack of coordination: Secondary | ICD-10-CM | POA: Diagnosis not present

## 2018-07-09 DIAGNOSIS — R293 Abnormal posture: Secondary | ICD-10-CM | POA: Diagnosis not present

## 2018-07-09 NOTE — Patient Instructions (Signed)
Access Code: [email or web address]  URL: [email or web address]  Date: 07/09/2018  Prepared by: Sheria Lang   Exercises Sidelying Thoracic Rotation with Open Book - 10 reps - 1x daily - 7x weekly Seated Cat Camel - 10 reps - 1x daily - 7x weekly

## 2018-07-09 NOTE — Therapy (Signed)
[facility] [facility] MAIN [facility] SERVICES [address] [place], [place], [address] Phone: [phone]   Fax:  [phone]  Physical Therapy Treatment  Patient Details  Name: Teralyn Parkhurst MRN: [id number] Date of Birth: 1981/02/13 Referring Provider (PT): [place], [place]   Encounter Date: 07/09/2018  PT End of Session - 07/09/18 0813    Visit Number  2    Number of Visits  13    Date for PT Re-Evaluation  09/24/18    PT Start Time  0804    PT Stop Time  0858    PT Time Calculation (min)  54 min    Activity Tolerance  Patient tolerated treatment well    Behavior During Therapy  [facility] for tasks assessed/performed       Past Medical History:  Diagnosis Date  . Depression   . Insomnia     Past Surgical History:  Procedure Laterality Date  . APPENDECTOMY      There were no vitals filed for this visit.  Subjective Assessment - 07/09/18 0807    Subjective  Patient states that she has not had any significant changes since her last visit. Patient reports that she is still considering sleep hygiene tips that she can apply.    Currently in Pain?  Yes    Pain Score  3     Pain Location  Back    Pain Orientation  Left;Lower    Pain Descriptors / Indicators  Tightness    Pain Type  Chronic pain    Multiple Pain Sites  No      TREATMENT  Pre-treatment assessment:  NIH-CPSI Female 22/43; ICIQ-UI 7/21. Slumped posture.   Manual Therapy: Internal Vaginal Exam: Skin integrity: normal Introitus Appears: normal Strength (PERF): Power 2/5; Endurance 1.5 seconds; Repetitions: 2; Fast twitch: 2 Symmetry: no abnormalities noted. Patient has increased tone on R deep PFM musculature at 7-9 oclock positions. Palpation: decreased sensation to S1-S3 dermatomes with sharp/dull, R decreased >L. Prolapse visible?: No Scar mobility: No issues noted at this time  Sideying openbook UPA mobilizations with movement for neural stimulation and improved spinal  mobility, grade II/III. Performed bilaterally   Neuromuscular Re-education: Patient educated on nerve recovery and healing; neuroanatomy of the pelvis as relevant to her presenting complaint, and PFM function and role in recovery. Supine hooklying diaphragmatic breathing with VCs and TCs for downregulation of the nervous system and improved management of IAP Sidelying, thoracolumbar rotations (open-book) bilaterally with diaphragmatic breathing for improved diaphragmatic and rib cage excursion. VCs and TCs to prevent compensations. Supine hooklying, PFM lengthening with inhalation. VCs and TCs to decrease compensatory patterns and encourage optimal relaxation of the PFM. Seated cat cow with TCs and VCs to encourage coordinated spinal mobility with diaphragmatic breathing.   Post-treatment assessment: Slumped posture.   Patient educated throughout session on appropriate technique and form using multi-modal cueing, HEP, and activity modification. Patient articulated understanding and returned demonstration.  Patient Response to interventions: Patient denied unresolved increase in pain. Patient reported 10/10 confidence with new HEP exercises (open book, seated cat cow)  ASSESSMENT Patient presents to clinic with excellent motivation to participate in therapy. Patient demonstrates deficits in posture, sensation, pain, and strength as evidenced by 2/5 PFM MMT, limitations in sensation during sharp/dull testing at S1-S3 with greatest deficits on R, and TTP and increased tone on internal exam at 7-9 o'clock positions on R. Patient able to achieve seated cat cow during today's session and responded positively to educational interventions. Patient will benefit  from continued skilled therapeutic intervention to address remaining deficits in posture, sensation, pain, and strength in order to increase function, and improve overall QOL.       PT Short Term Goals - 07/03/18 0913      PT SHORT TERM GOAL  #1   Title  Patient will demonstrate independence with HEP in order to maximize therapeutic gains and improve carryover from therapy sessions to ADLs in the home and community.    Baseline  IE: not initiated    Time  6    Period  Weeks    Status  New    Target Date  08/14/18      PT SHORT TERM GOAL #2   Title  Patient will decrease worst back pain as reported on NPRS by at least 2 points in order to demonstrate clinically significant reduction in back pain for improved ability to care for child.    Baseline  IE: 5/10    Time  6    Period  Weeks    Status  New    Target Date  08/14/18        PT Long Term Goals - 07/03/18 0918      PT LONG TERM GOAL #1   Title  Patient will decrease worst back pain as reported on NPRS to a 0-1/10 in order to demonstrate full recovery of LBP with improved body mechanics for participation in caregiving and wellness activities.    Baseline  IE: 5/10    Time  12    Period  Weeks    Status  New    Target Date  09/24/18      PT LONG TERM GOAL #2   Title  Patient will report no incidents of urinary incontinence over the course of 3 weeks while coughing/sneezing/laughing/prolonged walking in order to demonstrate improved PFM coordination, strength, and function for improved overall QOL.    Baseline  IE: 50% of cough/sneeze/laugh, walking > 4 min.    Time  12    Period  Weeks    Status  New    Target Date  09/24/18      PT LONG TERM GOAL #3   Title  Patient will demonstrate at least a 7 point reduction on NIH-CPSI Female outcome measure in order to demonstrate clinically significant improvement in pelvic sensation for improved menstrual hygiene and ADLs.     Baseline  IE: provided outcome    Time  12    Period  Weeks    Status  New    Target Date  09/24/18      PT LONG TERM GOAL #4   Title  Patient will demonstrate proper body mechanics independent of cueing for lifting, carrying, and squatting for improved pain free movement during ADLs and  participation in caregiving.    Baseline  IE: patterns demonstrate lack of coordination and strength    Time  12    Period  Weeks    Status  New    Target Date  09/24/18      PT LONG TERM GOAL #5   Title  Patient will have improved lumbar motion as evidenced by symmetrical and pain free bilateral sidebending, lumbar flexion, and lumbar extension for improved function and participation in ADLs and caregiving.    Baseline  IE: LSB 45, RSB 36, pain with all motions    Time  12    Period  Weeks    Status  New    Target Date  09/24/18            Plan - 07/09/18 1151    Clinical Impression Statement  Patient presents to clinic with excellent motivation to participate in therapy. Patient demonstrates deficits in posture, sensation, pain, and strength as evidenced by 2/5 PFM MMT, limitations in sensation during sharp/dull testing at S1-S3 with greatest deficits on R, and TTP and increased tone on internal exam at 7-9 o'clock positions on R. Patient able to achieve seated cat cow during today's session and responded positively to educational interventions. Patient will benefit from continued skilled therapeutic intervention to address remaining deficits in posture, sensation, pain, and strength in order to increase function, and improve overall QOL.    Personal Factors and Comorbidities  Behavior Pattern;Fitness;Comorbidity 1;Time since onset of injury/illness/exacerbation;Other    Comorbidities  Insomnia; grade 3 tear with vaginal delivery    Examination-Activity Limitations  Locomotion Level;Stand;Squat;Lift;Hygiene/Grooming;Stairs;Caring for Others;Reach Overhead;Carry;Continence;Sleep    Examination-Participation Restrictions  Laundry;Shop;Yard Work;Other;Interpersonal Relationship;Community Activity;Cleaning    Stability/Clinical Decision Making  Evolving/Moderate complexity    Rehab Potential  Good    PT Frequency  1x / week    PT Duration  12 weeks    PT Treatment/Interventions   ADLs/Self Care Home Management;Aquatic Therapy;Biofeedback;Cryotherapy;Electrical Stimulation;Moist Heat;Stair training;Functional mobility training;Gait training;Therapeutic activities;Therapeutic exercise;Balance training;Neuromuscular re-education;Manual techniques;Dry needling;Passive range of motion;Scar mobilization;Patient/family education;Taping;Joint Manipulations;Spinal Manipulations    PT Next Visit Plan  internal release, PFM lengthening, sacral border/mobility    PT Home Exercise Plan  [id number]       Consulted and Agree with Plan of Care  Patient       Patient will benefit from skilled therapeutic intervention in order to improve the following deficits and impairments:  Abnormal gait, Impaired sensation, Improper body mechanics, Pain, Decreased scar mobility, Postural dysfunction, Decreased coordination, Decreased range of motion, Decreased strength, Hypomobility, Difficulty walking, Decreased balance, Increased muscle spasms  Visit Diagnosis: Muscle weakness (generalized)  Other lack of coordination  Abnormal posture     Problem List Patient Active Problem List   Diagnosis Date Noted  . Vitamin D deficiency 01/31/2018  . Anemia 01/31/2018  . Depression   . Insomnia    Sheria Lang PT, [place] #[id number] 07/09/2018, 11:52 AM  [place] [facility] MAIN [facility] SERVICES [address] [place], [place], [address] Phone: [phone]   Fax:  [phone]  Name: Shawntia Mangal MRN: [id number] Date of Birth: 1980-08-05

## 2018-07-15 ENCOUNTER — Encounter: Payer: Self-pay | Admitting: Physical Therapy

## 2018-07-15 NOTE — Therapy (Signed)
[place] [facility] MAIN [facility] SERVICES [address] [place], [place], [address] Phone: [phone]   Fax:  [phone]  Patient Details  Name: Angela Knox MRN: [id number] Date of Birth: 08/22/1980 Referring Provider:  Purcell Nails, CNM  Encounter Date: 07/15/2018  DPT called patient to let patient know the status of the clinic and ensure that we will be reaching out to schedule when we re-open. DPT fielded any questions patient had at this time and offered guidance on current home program as necessary. Patient reported having increased back stiffness since beginning to work from home and increased stress; patient encouraged to continue with HEP and stretches. Patient had no further questions at this time, and DPT advised that should questions arise patient can reach out to the clinic for guidance via phone or DPT email.  Sheria Lang PT, DPT [phone] 07/15/2018, 1:32 PM  [facility] MAIN [facility] SERVICES [address] [place], [place], [address] Phone: [phone]   Fax:  [phone]

## 2018-07-16 ENCOUNTER — Ambulatory Visit: Payer: BLUE CROSS/BLUE SHIELD

## 2018-07-23 ENCOUNTER — Ambulatory Visit: Payer: BLUE CROSS/BLUE SHIELD

## 2018-07-30 ENCOUNTER — Ambulatory Visit: Payer: BLUE CROSS/BLUE SHIELD

## 2018-08-06 ENCOUNTER — Ambulatory Visit: Payer: BLUE CROSS/BLUE SHIELD

## 2018-08-13 ENCOUNTER — Ambulatory Visit: Payer: BLUE CROSS/BLUE SHIELD

## 2018-08-19 ENCOUNTER — Other Ambulatory Visit: Payer: Self-pay

## 2018-08-19 ENCOUNTER — Telehealth: Payer: Self-pay | Admitting: Registered Nurse

## 2018-08-19 DIAGNOSIS — J019 Acute sinusitis, unspecified: Secondary | ICD-10-CM

## 2018-08-19 DIAGNOSIS — R519 Headache, unspecified: Secondary | ICD-10-CM

## 2018-08-19 DIAGNOSIS — R51 Headache: Secondary | ICD-10-CM

## 2018-08-19 MED ORDER — FLUTICASONE PROPIONATE 50 MCG/ACT NA SUSP: 1.0000 | Freq: Two times a day (BID) | NASAL | 6 refills | Status: DC

## 2018-08-19 MED ORDER — ACETAMINOPHEN 500 MG PO TABS: 1000.0000 mg | ORAL_TABLET | Freq: Four times a day (QID) | ORAL | 0 refills | Status: AC | PRN

## 2018-08-19 MED ORDER — SALINE SPRAY 0.65 % NA SOLN: 2.0000 | NASAL | 0 refills | Status: DC

## 2018-08-19 MED ORDER — BUTALBITAL-APAP-CAFFEINE 50-325-40 MG PO TABS: 1.0000 | ORAL_TABLET | Freq: Four times a day (QID) | ORAL | 0 refills | Status: DC | PRN

## 2018-08-19 NOTE — Progress Notes (Signed)
Subjective:    Patient ID: Angela Knox, female    DOB: March 21, 1981, 38 y.o.   MRN: [id number]  37y/o established caucasian female complains of foreheaad headache started this weekend with changes in weather/thunderstorms.  Has been taking zyrtec daily for allergies.  Typically takes tylenol for headache tried a couple doses without relief.  Has noticed post nasal drip and pressure behind eyes also..    Stopped flonase/saline nasal last year.  Denied ear discharge, decreased hearing, cough, nausea, vomiting, diarrhea, rash, fever, chills, dyspnea, wheezing, worst headache of life, visual changes or chest pain.     Review of Systems  Constitutional: Negative for activity change, appetite change, chills, diaphoresis, fatigue, fever and unexpected weight change.  HENT: Positive for postnasal drip, sinus pressure and sinus pain. Negative for congestion, dental problem, drooling, ear discharge, ear pain, facial swelling, hearing loss, mouth sores, nosebleeds, rhinorrhea, sneezing, sore throat, tinnitus, trouble swallowing and voice change.   Eyes: Negative for photophobia, pain, discharge, redness, itching and visual disturbance.  Respiratory: Negative for cough, shortness of breath, wheezing and stridor.   Cardiovascular: Negative for chest pain.  Gastrointestinal: Negative for diarrhea, nausea and vomiting.  Endocrine: Negative for cold intolerance and heat intolerance.  Genitourinary: Negative for difficulty urinating.  Musculoskeletal: Negative for gait problem, neck pain and neck stiffness.  Skin: Negative for color change and rash.  Allergic/Immunologic: Positive for environmental allergies. Negative for food allergies.  Neurological: Positive for headaches. Negative for dizziness, tremors, seizures, syncope, facial asymmetry, speech difficulty, weakness, light-headedness and numbness.  Hematological: Negative for adenopathy. Does not bruise/bleed easily.  Psychiatric/Behavioral:  Negative for confusion.       Objective:   Physical Exam Constitutional:      General: She is awake. She is not in acute distress. HENT:     Nose:     Right Sinus: Frontal sinus tenderness present. No maxillary sinus tenderness.     Left Sinus: Frontal sinus tenderness present. No maxillary sinus tenderness.  Pulmonary:     Effort: Pulmonary effort is normal. No respiratory distress.     Breath sounds: Normal air entry. No stridor.     Comments: Patient spoke full sentences without difficulty; no cough observed Skin:    General: Skin is warm and dry.  Neurological:     Mental Status: She is alert and oriented to person, place, and time.  Psychiatric:        Attention and Perception: Attention and perception normal.        Mood and Affect: Mood and affect normal.        Speech: Speech normal.        Behavior: Behavior normal. Behavior is cooperative.        Thought Content: Thought content normal.        Judgment: Judgment normal.       Telephone evaluation/encounter 5 minutes duration.    Assessment & Plan:  A-acute rhinosinusitis and frontal headache  P-Discussed atmospheric pressure changes/storms the past few days can worsen sinus pain when inflammation present from seasonal allergies. TTP with frontal sinus palpation by patient denied teeth/ear pain and pressure behind eyes.  Has noticed postnasal drip despite zyrtec use. Tylenol 1000mg  po q6h prn pain OTC.  Patient may use normal saline nasal spray 2 sprays each nostril q2h wa as needed OTC.  Patient has flonase at home and renewed Rx electronically to her pharmacy of choice flonase 1 spray each nostril BID #1 RF6.  OTC antihistamine of choice zyrtec 10mg   po daily.  Avoid triggers if possible.  Shower prior to bedtime if exposed to triggers.  If allergic dust/dust mites recommend mattress/pillow covers/encasements; washing linens, vacuuming, sweeping, dusting weekly.  Call or return to clinic as needed if these  symptoms worsen or fail to improve as anticipated.   Exitcare handout on allergic rhinitis, sinusitis and sinus rinse to mychart.  Patient verbalized understanding of instructions, agreed with plan of care and had no further questions at this time.    P2:  Avoidance and hand washing.  For acute pain, rest, and intermittent application of heat, analgesics, and PRN po NSAIDS or may retrial fioricet she has at home from previous Rx 1-2 tabs po q6h max 6 tabs per 24 hours.  First going to trial tylenol, flonase and nasal saline and continue zyrtec.  Avoid known triggers e.g. sleep deprivation, foods, stress, dehydration.  If headache is the worst headache of entire life and came on like a clap of thunder patient was instructed to go to the Emergency Room.  Call or return to clinic as needed if these symptoms worsen or fail to improve as anticipated.  Exitcare handout on frontal headache.  If no improvement with plan of care recommended to [facility]/re-evaluation.  Patient does not have blood pressure cuff at home to check BP.  Patient verbalized agreement and understanding of treatment plan. P2:  Diet and fitness

## 2018-08-19 NOTE — Patient Instructions (Signed)
Sinus Headache  A sinus headache occurs when your sinuses become clogged or swollen. Sinuses are air-filled spaces in your skull that are behind the bones of your face and forehead. Sinus headaches can range from mild to severe. What are the causes? A sinus headache can result from various conditions that affect the sinuses. Common causes include:  Colds.  Sinus infections.  Allergies. Many people confuse sinus headaches with migraines or tension headaches because those headaches can also cause facial pain and nasal symptoms. What are the signs or symptoms? The main symptom of this condition is a headache that may feel like pain or pressure in your face, forehead, ears, or upper teeth. People who have a sinus headache often have other symptoms, such as:  Congested or runny nose.  Fever.  Inability to smell. Weather changes can make symptoms worse. How is this diagnosed? This condition may be diagnosed based on:  A physical exam and medical history.  Imaging tests, such as a CT scan or MRI, to check for problems with the sinuses.  Examination of the sinuses using a thin tool with a camera that is inserted through your nose (endoscopy). How is this treated? Treatment for this condition depends on the cause.  Sinus pain that is caused by a sinus infection may be treated with antibiotic medicine.  Sinus pain that is caused by allergies may be helped by allergy medicines (antihistamines) and medicated nasal sprays.  Sinus pain that is caused by congestion may be helped by rinsing out (flushing) the nose and sinuses with saline solution.  Sinus surgery may be needed in some cases if other treatments do not help. Follow these instructions at home: General instructions  If directed: ? Apply a warm, moist washcloth to your face to help relieve pain. ? Use a nasal saline wash. Medicines   Take over-the-counter and prescription medicines only as told by your health care provider.   If you were prescribed an antibiotic medicine, take it as told by your health care provider. Do not stop taking the antibiotic even if you start to feel better.  If you have congestion, use a nasal spray to help lessen pressure. Hydrate and humidify  Drink enough water to keep your urine clear or pale yellow. Staying hydrated will help to thin your mucus.  Use a cool mist humidifier to keep the humidity level in your home above 50%.  Inhale steam for 10-15 minutes, 3-4 times a day or as told by your health care provider. You can do this in the bathroom while a hot shower is running.  Limit your exposure to cool or dry air. Contact a health care provider if:  You have a headache more than one time a week.  You have sensitivity to light or sound.  You develop a fever.  You feel nauseous or you vomit.  Your headaches do not get better with treatment. Many people think that they have a sinus headache when they actually have a migraine or a tension headache. Get help right away if:  You have vision problems.  You have sudden, severe pain in your face or head.  You have a seizure.  You are confused.  You have a stiff neck. Summary  A sinus headache occurs when your sinuses become clogged or swollen.  A sinus headache can result from various conditions that affect the sinuses, such as a cold, a sinus infection, or an allergy.  Treatment for this condition depends on the cause. It may include   medicine, such as antibiotics or antihistamines. This information is not intended to replace advice given to you by your health care provider. Make sure you discuss any questions you have with your health care provider. Document Released: 05/18/2004 Document Revised: 01/19/2017 Document Reviewed: 01/19/2017 Elsevier Interactive Patient Education  2019 [employer] Allergic Rhinitis, Adult Allergic rhinitis is an allergic reaction that affects the mucous membrane inside the nose. It  causes sneezing, a runny or stuffy nose, and the feeling of mucus going down the back of the throat (postnasal drip). Allergic rhinitis can be mild to severe. There are two types of allergic rhinitis:  Seasonal. This type is also called hay fever. It happens only during certain seasons.  Perennial. This type can happen at any time of the year. What are the causes? This condition happens when the body's defense system (immune system) responds to certain harmless substances called allergens as though they were germs.  Seasonal allergic rhinitis is triggered by pollen, which can come from grasses, trees, and weeds. Perennial allergic rhinitis may be caused by:  House dust mites.  Pet dander.  Mold spores. What are the signs or symptoms? Symptoms of this condition include:  Sneezing.  Runny or stuffy nose (nasal congestion).  Postnasal drip.  Itchy nose.  Tearing of the eyes.  Trouble sleeping.  Daytime sleepiness. How is this diagnosed? This condition may be diagnosed based on:  Your medical history.  A physical exam.  Tests to check for related conditions, such as: ? Asthma. ? Pink eye. ? Ear infection. ? Upper respiratory infection.  Tests to find out which allergens trigger your symptoms. These may include skin or blood tests. How is this treated? There is no cure for this condition, but treatment can help control symptoms. Treatment may include:  Taking medicines that block allergy symptoms, such as antihistamines. Medicine may be given as a shot, nasal spray, or pill.  Avoiding the allergen.  Desensitization. This treatment involves getting ongoing shots until your body becomes less sensitive to the allergen. This treatment may be done if other treatments do not help.  If taking medicine and avoiding the allergen does not work, new, stronger medicines may be prescribed. Follow these instructions at home:  Find out what you are allergic to. Common allergens  include smoke, dust, and pollen.  Avoid the things you are allergic to. These are some things you can do to help avoid allergens: ? Replace carpet with wood, tile, or vinyl flooring. Carpet can trap dander and dust. ? Do not smoke. Do not allow smoking in your home. ? Change your heating and air conditioning filter at least once a month. ? During allergy season:  Keep windows closed as much as possible.  Plan outdoor activities when pollen counts are lowest. This is usually during the evening hours.  When coming indoors, change clothing and shower before sitting on furniture or bedding.  Take over-the-counter and prescription medicines only as told by your health care provider.  Keep all follow-up visits as told by your health care provider. This is important. Contact a health care provider if:  You have a fever.  You develop a persistent cough.  You make whistling sounds when you breathe (you wheeze).  Your symptoms interfere with your normal daily activities. Get help right away if:  You have shortness of breath. Summary  This condition can be managed by taking medicines as directed and avoiding allergens.  Contact your health care provider if you develop a persistent cough  or fever.  During allergy season, keep windows closed as much as possible. This information is not intended to replace advice given to you by your health care provider. Make sure you discuss any questions you have with your health care provider. Document Released: 01/03/2001 Document Revised: 05/18/2016 Document Reviewed: 05/18/2016 Elsevier Interactive Patient Education  2019 [employer] Sinusitis, Adult Sinusitis is inflammation of your sinuses. Sinuses are hollow spaces in the bones around your face. Your sinuses are located:  Around your eyes.  In the middle of your forehead.  Behind your nose.  In your cheekbones. Mucus normally drains out of your sinuses. When your nasal tissues become  inflamed or swollen, mucus can become trapped or blocked. This allows bacteria, viruses, and fungi to grow, which leads to infection. Most infections of the sinuses are caused by a virus. Sinusitis can develop quickly. It can last for up to 4 weeks (acute) or for more than 12 weeks (chronic). Sinusitis often develops after a cold. What are the causes? This condition is caused by anything that creates swelling in the sinuses or stops mucus from draining. This includes:  Allergies.  Asthma.  Infection from bacteria or viruses.  Deformities or blockages in your nose or sinuses.  Abnormal growths in the nose (nasal polyps).  Pollutants, such as chemicals or irritants in the air.  Infection from fungi (rare). What increases the risk? You are more likely to develop this condition if you:  Have a weak body defense system (immune system).  Do a lot of swimming or diving.  Overuse nasal sprays.  Smoke. What are the signs or symptoms? The main symptoms of this condition are pain and a feeling of pressure around the affected sinuses. Other symptoms include:  Stuffy nose or congestion.  Thick drainage from your nose.  Swelling and warmth over the affected sinuses.  Headache.  Upper toothache.  A cough that may get worse at night.  Extra mucus that collects in the throat or the back of the nose (postnasal drip).  Decreased sense of smell and taste.  Fatigue.  A fever.  Sore throat.  Bad breath. How is this diagnosed? This condition is diagnosed based on:  Your symptoms.  Your medical history.  A physical exam.  Tests to find out if your condition is acute or chronic. This may include: ? Checking your nose for nasal polyps. ? Viewing your sinuses using a device that has a light (endoscope). ? Testing for allergies or bacteria. ? Imaging tests, such as an MRI or CT scan. In rare cases, a bone biopsy may be done to rule out more serious types of fungal sinus  disease. How is this treated? Treatment for sinusitis depends on the cause and whether your condition is chronic or acute.  If caused by a virus, your symptoms should go away on their own within 10 days. You may be given medicines to relieve symptoms. They include: ? Medicines that shrink swollen nasal passages (topical intranasal decongestants). ? Medicines that treat allergies (antihistamines). ? A spray that eases inflammation of the nostrils (topical intranasal corticosteroids). ? Rinses that help get rid of thick mucus in your nose (nasal saline washes).  If caused by bacteria, your health care provider may recommend waiting to see if your symptoms improve. Most bacterial infections will get better without antibiotic medicine. You may be given antibiotics if you have: ? A severe infection. ? A weak immune system.  If caused by narrow nasal passages or nasal polyps, you  may need to have surgery. Follow these instructions at home: Medicines  Take, use, or apply over-the-counter and prescription medicines only as told by your health care provider. These may include nasal sprays.  If you were prescribed an antibiotic medicine, take it as told by your health care provider. Do not stop taking the antibiotic even if you start to feel better. Hydrate and humidify   Drink enough fluid to keep your urine pale yellow. Staying hydrated will help to thin your mucus.  Use a cool mist humidifier to keep the humidity level in your home above 50%.  Inhale steam for 10-15 minutes, 3-4 times a day, or as told by your health care provider. You can do this in the bathroom while a hot shower is running.  Limit your exposure to cool or dry air. Rest  Rest as much as possible.  Sleep with your head raised (elevated).  Make sure you get enough sleep each night. General instructions   Apply a warm, moist washcloth to your face 3-4 times a day or as told by your health care provider. This will  help with discomfort.  Wash your hands often with soap and water to reduce your exposure to germs. If soap and water are not available, use hand sanitizer.  Do not smoke. Avoid being around people who are smoking (secondhand smoke).  Keep all follow-up visits as told by your health care provider. This is important. Contact a health care provider if:  You have a fever.  Your symptoms get worse.  Your symptoms do not improve within 10 days. Get help right away if:  You have a severe headache.  You have persistent vomiting.  You have severe pain or swelling around your face or eyes.  You have vision problems.  You develop confusion.  Your neck is stiff.  You have trouble breathing. Summary  Sinusitis is soreness and inflammation of your sinuses. Sinuses are hollow spaces in the bones around your face.  This condition is caused by nasal tissues that become inflamed or swollen. The swelling traps or blocks the flow of mucus. This allows bacteria, viruses, and fungi to grow, which leads to infection.  If you were prescribed an antibiotic medicine, take it as told by your health care provider. Do not stop taking the antibiotic even if you start to feel better.  Keep all follow-up visits as told by your health care provider. This is important. This information is not intended to replace advice given to you by your health care provider. Make sure you discuss any questions you have with your health care provider. Document Released: 04/10/2005 Document Revised: 09/10/2017 Document Reviewed: 09/10/2017 Elsevier Interactive Patient Education  2019 [employer]. How to Perform a Sinus Rinse A sinus rinse is a home treatment that is used to rinse your sinuses with a sterile mixture of salt and water (saline solution). Sinuses are air-filled spaces in your skull behind the bones of your face and forehead that open into your nasal cavity. A sinus rinse can help to clear mucus, dirt, dust,  or pollen from your nasal cavity. You may do a sinus rinse when you have a cold, a virus, nasal allergy symptoms, a sinus infection, or stuffiness in your nose or sinuses. Talk with your health care provider about whether a sinus rinse might help you. What are the risks? A sinus rinse is generally safe and effective. However, there are a few risks, which include:  A burning sensation in your sinuses. This  may happen if you do not make the saline solution as directed. Be sure to follow all directions when making the saline solution.  Nasal irritation.  Infection from contaminated water. This is rare, but possible. Do not do a sinus rinse if you have had ear or nasal surgery, ear infection, or blocked ears. Supplies needed:  Saline solution or powder.  Distilled or sterile water may be needed to mix with saline powder. ? You may use boiled and cooled tap water. Boil tap water for 5 minutes; cool until it is lukewarm. Use within 24 hours. ? Do not use regular tap water to mix with the saline solution.  Neti pot or nasal rinse bottle. These supplies release the saline solution into your nose and through your sinuses. Neti pots and nasal rinse bottles can be purchased at [employer], a health food store, or online. How to perform a sinus rinse  1. Wash your hands with soap and water. 2. Wash your device according to the directions that came with the product and then dry it. 3. Use the solution that comes with your product or one that is sold separately in stores. Follow the mixing directions on the package if you need to mix with sterile or distilled water. 4. Fill the device with the amount of saline solution noted in the device instructions. 5. Stand over a sink and tilt your head sideways over the sink. 6. Place the spout of the device in your upper nostril (the one closer to the ceiling). 7. Gently pour or squeeze the saline solution into your nasal cavity. The liquid should drain  out from the lower nostril if you are not too congested. 8. While rinsing, breathe through your open mouth. 9. Gently blow your nose to clear any mucus and rinse solution. Blowing too hard may cause ear pain. 10. Repeat in your other nostril. 11. Clean and rinse your device with clean water and then air-dry it. Talk with your health care provider or pharmacist if you have questions about how to do a sinus rinse. Summary  A sinus rinse is a home treatment that is used to rinse your sinuses with a sterile mixture of salt and water (saline solution).  A sinus rinse is generally safe and effective. Follow all instructions carefully.  Before doing a sinus rinse, talk with your health care provider about whether it would be helpful for you. This information is not intended to replace advice given to you by your health care provider. Make sure you discuss any questions you have with your health care provider. Document Released: 11/05/2013 Document Revised: 02/05/2017 Document Reviewed: 02/05/2017 Elsevier Interactive Patient Education  2019 [employer].

## 2018-08-20 ENCOUNTER — Ambulatory Visit: Payer: BLUE CROSS/BLUE SHIELD

## 2018-08-27 ENCOUNTER — Ambulatory Visit: Payer: BLUE CROSS/BLUE SHIELD

## 2018-08-29 ENCOUNTER — Other Ambulatory Visit: Payer: Self-pay

## 2018-08-29 ENCOUNTER — Ambulatory Visit (INDEPENDENT_AMBULATORY_CARE_PROVIDER_SITE_OTHER): Payer: BLUE CROSS/BLUE SHIELD | Admitting: Obstetrics and Gynecology

## 2018-08-29 ENCOUNTER — Encounter: Payer: Self-pay | Admitting: Obstetrics and Gynecology

## 2018-08-29 VITALS — BP 118/76 | HR 98 | Ht 65.0 in | Wt 196.0 lb

## 2018-08-29 DIAGNOSIS — F329 Major depressive disorder, single episode, unspecified: Secondary | ICD-10-CM | POA: Diagnosis not present

## 2018-08-29 DIAGNOSIS — O99340 Other mental disorders complicating pregnancy, unspecified trimester: Secondary | ICD-10-CM

## 2018-08-29 NOTE — Progress Notes (Signed)
Virtual Visit via Telephone Note  I connected with Angela Knox on 08/29/18 at  3:30 PM EDT by telephone and verified that I am speaking with the correct person using two identifiers.  Location: Patient: home Provider: office   I discussed the limitations, risks, security and privacy concerns of performing an evaluation and management service by telephone and the availability of in person appointments. I also discussed with the patient that there may be a patient responsible charge related to this service. The patient expressed understanding and agreed to proceed.   History of Present Illness: Patient reports depression has worsened over the last month. Feels like it is r/t working from home and limited activities with covid-19 stay at home orders. States she feels sad a lot and having trouble concentrating. Is still taking lexapro 10 mg daily. Lives with spouse and 80 month old daughter.   Depression screen [facility] 2/9 08/29/2018 01/30/2018 07/10/2017 07/01/2015 06/02/2015  Decreased Interest 3 0 2 1 3   Down, Depressed, Hopeless 3 0 2 0 2  PHQ - 2 Score 6 0 4 1 5   Altered sleeping 3 0 3 0 3  Tired, decreased energy 3 1 3 1 3   Change in appetite 3 0 3 0 2  Feeling bad or failure about yourself  0 0 1 0 1  Trouble concentrating 1 0 0 0 3  Moving slowly or fidgety/restless 0 0 0 0 1  Suicidal thoughts 0 0 0 0 0  PHQ-9 Score 16 1 14 2 18   Difficult doing work/chores Somewhat difficult Not difficult at all Somewhat difficult Somewhat difficult Somewhat difficult     Observations/Objective: Well sounding female in no acute distress. Psychiatric Specialty Exam: Physical Exam  ROS  Blood pressure 118/76, pulse 98, height 5\' 5"  (1.651 m), weight 196 lb (88.9 kg), not currently breastfeeding.Body mass index is 32.62 kg/m.  General Appearance: NA  Eye Contact:  NA  Speech:  Normal Rate  Volume:  Normal  Mood:  Depressed  Affect:  Depressed and Full Range  Thought Process:  Coherent   Orientation:  Full (Time, Place, and Person)  Thought Content:  WDL  Suicidal Thoughts:  No  Homicidal Thoughts:  No  Memory:  Immediate;   Good Recent;   Good  Judgement:  Fair  Insight:  Good  Psychomotor Activity:  NA  Concentration:  Concentration: Good and Attention Span: Fair  Recall:  Good  Fund of Knowledge:  Good  Language:  Good  Akathisia:  NA  Handed:  Right  AIMS (if indicated):     Assets:  Communication Skills Desire for Improvement Financial Resources/Insurance Housing Intimacy Physical Health Resilience Social Support Talents/Skills Transportation Vocational/Educational  ADL's:  Intact  Cognition:  WNL  Sleep:       Assessment and Plan: depression  Follow Up Instructions: Will increase lexapro to 20mg  daily for 2 weeks. if not improved will consider adding Abilify at that time.  Will let me know via MyChart at that time how she is feeling, sooner if anything worsens.    I discussed the assessment and treatment plan with the patient. The patient was provided an opportunity to ask questions and all were answered. The patient agreed with the plan and demonstrated an understanding of the instructions.   The patient was advised to call back or seek an in-person evaluation if the symptoms worsen or if the condition fails to improve as anticipated.  I provided 10 minutes of non-face-to-face time during this encounter.   Tanayah Squitieri  Rockney Ghee, CNM

## 2018-09-04 ENCOUNTER — Other Ambulatory Visit: Payer: Self-pay | Admitting: Obstetrics and Gynecology

## 2018-09-04 MED ORDER — ZOLPIDEM TARTRATE 5 MG PO TABS: 5.0000 mg | ORAL_TABLET | Freq: Every evening | ORAL | 1 refills | Status: DC | PRN

## 2018-09-10 ENCOUNTER — Other Ambulatory Visit: Payer: Self-pay | Admitting: Obstetrics and Gynecology

## 2018-09-10 MED ORDER — ZOLPIDEM TARTRATE 10 MG PO TABS: 10.0000 mg | ORAL_TABLET | Freq: Every evening | ORAL | 2 refills | Status: DC | PRN

## 2018-09-17 ENCOUNTER — Other Ambulatory Visit: Payer: Self-pay | Admitting: Obstetrics and Gynecology

## 2018-09-17 ENCOUNTER — Other Ambulatory Visit: Payer: Self-pay

## 2018-09-17 MED ORDER — ESCITALOPRAM OXALATE 10 MG PO TABS: 20.0000 mg | ORAL_TABLET | Freq: Every day | ORAL | 6 refills | Status: DC

## 2018-09-17 MED ORDER — ZOLPIDEM TARTRATE 10 MG PO TABS: 10.0000 mg | ORAL_TABLET | Freq: Every evening | ORAL | 2 refills | Status: DC | PRN

## 2018-09-17 MED ORDER — ESCITALOPRAM OXALATE 20 MG PO TABS: 20.0000 mg | ORAL_TABLET | Freq: Every day | ORAL | 6 refills | Status: DC

## 2018-11-06 ENCOUNTER — Other Ambulatory Visit: Payer: Self-pay | Admitting: Obstetrics and Gynecology

## 2018-11-06 MED ORDER — ARIPIPRAZOLE 5 MG PO TABS: 5.0000 mg | ORAL_TABLET | Freq: Every day | ORAL | 3 refills | Status: DC

## 2018-11-10 ENCOUNTER — Telehealth: Payer: BLUE CROSS/BLUE SHIELD

## 2018-11-10 DIAGNOSIS — J0100 Acute maxillary sinusitis, unspecified: Secondary | ICD-10-CM | POA: Diagnosis not present

## 2018-12-18 DIAGNOSIS — J208 Acute bronchitis due to other specified organisms: Secondary | ICD-10-CM | POA: Diagnosis not present

## 2018-12-26 ENCOUNTER — Telehealth: Payer: Self-pay | Admitting: Nurse Practitioner

## 2018-12-26 ENCOUNTER — Other Ambulatory Visit: Payer: Self-pay

## 2018-12-26 DIAGNOSIS — R059 Cough, unspecified: Secondary | ICD-10-CM

## 2018-12-26 DIAGNOSIS — R05 Cough: Secondary | ICD-10-CM

## 2018-12-26 DIAGNOSIS — Z9109 Other allergy status, other than to drugs and biological substances: Secondary | ICD-10-CM

## 2018-12-26 MED ORDER — PROMETHAZINE-DM 6.25-15 MG/5ML PO SYRP: 5.0000 mL | ORAL_SOLUTION | Freq: Four times a day (QID) | ORAL | 0 refills | Status: DC | PRN

## 2018-12-26 NOTE — Patient Instructions (Addendum)
Stop current cough syrup and start the new syrup sent Remember this med can cause drowsiness, please don't drive while taking this Fluids, rest and hydration Consider hot tea with honey and lemon to help the cough during the day, with lozenges Encouraged patient to call for an appointment if no improvement in symptoms or if symptoms change or worsen after 72 hours of planned treatment. Patient verbalized understanding of all instructions given/reviewed and has no further questions or concerns at this time.      Cough, Adult A cough helps to clear your throat and lungs. A cough may be a sign of an illness or another medical condition. An acute cough may only last 2-3 weeks, while a chronic cough may last 8 or more weeks. Many things can cause a cough. They include:  Germs (viruses or bacteria) that attack the airway.  Breathing in things that bother (irritate) your lungs.  Allergies.  Asthma.  Mucus that runs down the back of your throat (postnasal drip).  Smoking.  Acid backing up from the stomach into the tube that moves food from the mouth to the stomach (gastroesophageal reflux).  Some medicines.  Lung problems.  Other medical conditions, such as heart failure or a blood clot in the lung (pulmonary embolism). Follow these instructions at home: Medicines  Take over-the-counter and prescription medicines only as told by your doctor.  Talk with your doctor before you take medicines that stop a cough (coughsuppressants). Lifestyle    Do not smoke, and try not to be around smoke. Do not use any products that contain nicotine or tobacco, such as cigarettes, e-cigarettes, and chewing tobacco. If you need help quitting, ask your doctor.  Drink enough fluid to keep your pee (urine) pale yellow.  Avoid caffeine.  Do not drink alcohol if your doctor tells you not to drink. General instructions    Watch for any changes in your cough. Tell your doctor about them.  Always  cover your mouth when you cough.  Stay away from things that make you cough, such as perfume, candles, campfire smoke, or cleaning products.  If the air is dry, use a cool mist vaporizer or humidifier in your home.  If your cough is worse at night, try using extra pillows to raise your head up higher while you sleep.  Rest as needed.  Keep all follow-up visits as told by your doctor. This is important. Contact a doctor if:  You have new symptoms.  You cough up pus.  Your cough does not get better after 2-3 weeks, or your cough gets worse.  Cough medicine does not help your cough and you are not sleeping well.  You have pain that gets worse or pain that is not helped with medicine.  You have a fever.  You are losing weight and you do not know why.  You have night sweats. Get help right away if:  You cough up blood.  You have trouble breathing.  Your heartbeat is very fast. These symptoms may be an emergency. Do not wait to see if the symptoms will go away. Get medical help right away. Call your local emergency services (911 in the [place] Do not drive yourself to the hospital. Summary  A cough helps to clear your throat and lungs. Many things can cause a cough.  Take over-the-counter and prescription medicines only as told by your doctor.  Always cover your mouth when you cough.  Contact a doctor if you have new symptoms or you  have a cough that does not get better or gets worse. This information is not intended to replace advice given to you by your health care provider. Make sure you discuss any questions you have with your health care provider. Document Released: 12/22/2010 Document Revised: 04/29/2018 Document Reviewed: 04/29/2018 Elsevier Patient Education  [place].

## 2018-12-26 NOTE — Progress Notes (Signed)
   Subjective:    Patient ID: Angela Knox, female    DOB: September 26, 1980, 38 y.o.   MRN: [id number]  HPI [name] consents to a virtual visit today. She reports that she was seen by urgent care last week and was diagnosed with bronchitis and given an inhaler and bromfen-DM. She reports that her cough is the only thing that's bothersome and when she talks a lot or exerts herself she starts to cough. She reports the cough is productive and nonproductive and when it's productive she coughs up light green mucus. She denies SOB, wheezing, chest pain, sore throat, ear pain or facial pressure/pain. She continues to take Flonase, Zyrtec routinely.    Review of Systems  Constitutional: Negative for fatigue and fever.  Respiratory: Positive for cough. Negative for shortness of breath and wheezing.   Cardiovascular: Negative for chest pain.       Objective:   Physical Exam No evidence of distress on phone when talking       Assessment & Plan:

## 2019-02-07 ENCOUNTER — Ambulatory Visit: Payer: Self-pay

## 2019-02-07 ENCOUNTER — Other Ambulatory Visit: Payer: Self-pay

## 2019-02-07 DIAGNOSIS — Z23 Encounter for immunization: Secondary | ICD-10-CM

## 2019-03-03 ENCOUNTER — Other Ambulatory Visit: Payer: Self-pay

## 2019-03-03 DIAGNOSIS — Z20822 Contact with and (suspected) exposure to covid-19: Secondary | ICD-10-CM

## 2019-03-04 LAB — NOVEL CORONAVIRUS, NAA: SARS-CoV-2, NAA: NOT DETECTED

## 2019-03-27 ENCOUNTER — Telehealth: Payer: Self-pay

## 2019-03-27 NOTE — Telephone Encounter (Signed)
[facility] Zolpidem Tartrate 10MG  tablet.  Last OV 08/29/2018 with [facility], last annual exam 05/02/2018 with [facility].  Patient has no future appointments scheduled.  Please advice.  Thanks. Jennye Moccasin

## 2019-03-28 ENCOUNTER — Telehealth: Payer: Self-pay | Admitting: Certified Nurse Midwife

## 2019-03-28 ENCOUNTER — Other Ambulatory Visit: Payer: Self-pay | Admitting: Obstetrics and Gynecology

## 2019-03-28 MED ORDER — ZOLPIDEM TARTRATE 10 MG PO TABS: 10.0000 mg | ORAL_TABLET | Freq: Every evening | ORAL | 2 refills | Status: DC | PRN

## 2019-03-28 NOTE — Telephone Encounter (Signed)
Called and spoke with patient, aware prescription has been sent and request to be scheduled with [facility] for her AE.

## 2019-03-28 NOTE — Telephone Encounter (Signed)
The patient called and stated tat she needs a refill of her prescription  zolpidem (AMBIEN) 10 MG tablet [11700], The pt has not been able to message anyone on MyChart. Pt is requesting a call back. Please advise.

## 2019-03-28 NOTE — Telephone Encounter (Signed)
Informed pt that we did have her refill request. [facility] will review her records and respond before EOD.   Pt also states she is having issues sending [place] a my chart message. Given My chart help number. Pt appreciative of call.

## 2019-03-28 NOTE — Telephone Encounter (Signed)
Pt called to check status of medication refill and mychart error. Thank you.

## 2019-03-28 NOTE — Telephone Encounter (Signed)
Please inform patient that I will refill her meds that should last her for the next 2 months as she will be due for an annual exam (last one was in 05/02/2018). If she desires to continue seeing [facility], we can have her scheduled with either Deneise Lever or Sharyn Lull (who will be back from maternity leave by then) as she has seen them both also during her pregnancy, or she can choose an MD as well.

## 2019-04-25 ENCOUNTER — Telehealth: Payer: BC Managed Care – PPO | Admitting: Emergency Medicine

## 2019-04-25 DIAGNOSIS — H109 Unspecified conjunctivitis: Secondary | ICD-10-CM

## 2019-04-25 MED ORDER — CIPROFLOXACIN HCL 0.3 % OP SOLN: 1.0000 [drp] | OPHTHALMIC | 0 refills | Status: DC

## 2019-04-25 NOTE — Progress Notes (Signed)
E-Visit for [employer]   We are sorry that you are not feeling well.  Here is how we plan to help!  Based on what you have shared with me it looks like you have conjunctivitis.  Conjunctivitis is a common inflammatory or infectious condition of the eye that is often referred to as "pink eye".  In most cases it is contagious (viral or bacterial). However, not all conjunctivitis requires antibiotics (ex. Allergic).  We have made appropriate suggestions for you based upon your presentation.  I have prescribe Cipro ophthalmic drops.  Please instill 1 drop in each eye as directed for 7 days.  Don't replace your contacts for 7 days.  Discard your current contacts, and use a new/fresh pair after 7 days.  As we discussed on the phone, I think that you will do fine with the Cipro drops.  It shouldn't cause you to feel bad like that oral form.  However, if you have any problems, email [place] back and we will take care of you.  Please see the additional information below:  Pink eye can be highly contagious.  It is typically spread through direct contact with secretions, or contaminated objects or surfaces that one may have touched.  Strict handwashing is suggested with soap and water is urged.  If not available, use alcohol based had sanitizer.  Avoid unnecessary touching of the eye.  If you wear contact lenses, you will need to refrain from wearing them until you see no white discharge from the eye for at least 24 hours after being on medication.  You should see symptom improvement in 1-2 days after starting the medication regimen.  Call us if symptoms are not improved in 1-2 days.  Home Care:  Wash your hands often!  Do not wear your contacts until you complete your treatment plan.  Avoid sharing towels, bed linen, personal items with a person who has pink eye.  See attention for anyone in your home with similar symptoms.  Get Help Right Away If:  Your symptoms do not improve.  You develop blurred or  loss of vision.  Your symptoms worsen (increased discharge, pain or redness)  Your e-visit answers were reviewed by a board certified advanced clinical practitioner to complete your personal care plan.  Depending on the condition, your plan could have included both over the counter or prescription medications.  If there is a problem please reply  once you have received a response from your provider.  Your safety is important to [place].  If you have drug allergies check your prescription carefully.    You can use MyChart to ask questions about today's visit, request a non-urgent call back, or ask for a work or school excuse for 24 hours related to this e-Visit. If it has been greater than 24 hours you will need to follow up with your provider, or enter a new e-Visit to address those concerns.   You will get an e-mail in the next two days asking about your experience.  I hope that your e-visit has been valuable and will speed your recovery. Thank you for using e-visits.   Greater than 5 minutes, yet less than 10 minutes was used in reviewing the patient's chart, questionnaire, prescribing medications, and documentation for this visit.

## 2019-05-12 ENCOUNTER — Ambulatory Visit (INDEPENDENT_AMBULATORY_CARE_PROVIDER_SITE_OTHER): Payer: BC Managed Care – PPO | Admitting: Certified Nurse Midwife

## 2019-05-12 ENCOUNTER — Encounter: Payer: Self-pay | Admitting: Certified Nurse Midwife

## 2019-05-12 ENCOUNTER — Other Ambulatory Visit: Payer: Self-pay

## 2019-05-12 VITALS — BP 113/78 | HR 76 | Ht 65.0 in | Wt 217.5 lb

## 2019-05-12 DIAGNOSIS — R7989 Other specified abnormal findings of blood chemistry: Secondary | ICD-10-CM

## 2019-05-12 DIAGNOSIS — M6289 Other specified disorders of muscle: Secondary | ICD-10-CM

## 2019-05-12 DIAGNOSIS — F329 Major depressive disorder, single episode, unspecified: Secondary | ICD-10-CM | POA: Diagnosis not present

## 2019-05-12 DIAGNOSIS — Z01419 Encounter for gynecological examination (general) (routine) without abnormal findings: Secondary | ICD-10-CM

## 2019-05-12 DIAGNOSIS — F32A Depression, unspecified: Secondary | ICD-10-CM

## 2019-05-12 MED ORDER — ZOLPIDEM TARTRATE 10 MG PO TABS: 10.0000 mg | ORAL_TABLET | Freq: Every evening | ORAL | 2 refills | Status: DC | PRN

## 2019-05-12 MED ORDER — NORGESTREL-ETHINYL ESTRADIOL 0.5-50 MG-MCG PO TABS: 1.0000 | ORAL_TABLET | Freq: Every day | ORAL | 11 refills | Status: DC

## 2019-05-12 MED ORDER — ESCITALOPRAM OXALATE 20 MG PO TABS: 20.0000 mg | ORAL_TABLET | Freq: Every day | ORAL | 6 refills | Status: DC

## 2019-05-12 NOTE — Patient Instructions (Signed)
Preventive Care 21-37 Years Old, Female Preventive care refers to visits with your health care provider and lifestyle choices that can promote health and wellness. This includes:  A yearly physical exam. This may also be called an annual well check.  Regular dental visits and eye exams.  Immunizations.  Screening for certain conditions.  Healthy lifestyle choices, such as eating a healthy diet, getting regular exercise, not using drugs or products that contain nicotine and tobacco, and limiting alcohol use. What can I expect for my preventive care visit? Physical exam Your health care provider will check your:  Height and weight. This may be used to calculate body mass index (BMI), which tells if you are at a healthy weight.  Heart rate and blood pressure.  Skin for abnormal spots. Counseling Your health care provider may ask you questions about your:  Alcohol, tobacco, and drug use.  Emotional well-being.  Home and relationship well-being.  Sexual activity.  Eating habits.  Work and work environment.  Method of birth control.  Menstrual cycle.  Pregnancy history. What immunizations do I need?  Influenza (flu) vaccine  This is recommended every year. Tetanus, diphtheria, and pertussis (Tdap) vaccine  You may need a Td booster every 10 years. Varicella (chickenpox) vaccine  You may need this if you have not been vaccinated. Human papillomavirus (HPV) vaccine  If recommended by your health care provider, you may need three doses over 6 months. Measles, mumps, and rubella (MMR) vaccine  You may need at least one dose of MMR. You may also need a second dose. Meningococcal conjugate (MenACWY) vaccine  One dose is recommended if you are age 19-21 years and a first-year college student living in a residence hall, or if you have one of several medical conditions. You may also need additional booster doses. Pneumococcal conjugate (PCV13) vaccine  You may need  this if you have certain conditions and were not previously vaccinated. Pneumococcal polysaccharide (PPSV23) vaccine  You may need one or two doses if you smoke cigarettes or if you have certain conditions. Hepatitis A vaccine  You may need this if you have certain conditions or if you travel or work in places where you may be exposed to hepatitis A. Hepatitis B vaccine  You may need this if you have certain conditions or if you travel or work in places where you may be exposed to hepatitis B. Haemophilus influenzae type b (Hib) vaccine  You may need this if you have certain conditions. You may receive vaccines as individual doses or as more than one vaccine together in one shot (combination vaccines). Talk with your health care provider about the risks and benefits of combination vaccines. What tests do I need?  Blood tests  Lipid and cholesterol levels. These may be checked every 5 years starting at age 20.  Hepatitis C test.  Hepatitis B test. Screening  Diabetes screening. This is done by checking your blood sugar (glucose) after you have not eaten for a while (fasting).  Sexually transmitted disease (STD) testing.  BRCA-related cancer screening. This may be done if you have a family history of breast, ovarian, tubal, or peritoneal cancers.  Pelvic exam and Pap test. This may be done every 3 years starting at age 21. Starting at age 30, this may be done every 5 years if you have a Pap test in combination with an HPV test. Talk with your health care provider about your test results, treatment options, and if necessary, the need for more tests.   Follow these instructions at home: Eating and drinking   Eat a diet that includes fresh fruits and vegetables, whole grains, lean protein, and low-fat dairy.  Take vitamin and mineral supplements as recommended by your health care provider.  Do not drink alcohol if: ? Your health care provider tells you not to drink. ? You are  pregnant, may be pregnant, or are planning to become pregnant.  If you drink alcohol: ? Limit how much you have to 0-1 drink a day. ? Be aware of how much alcohol is in your drink. In the [place] one drink equals one 12 oz bottle of beer (355 mL), one 5 oz glass of wine (148 mL), or one 1 oz glass of hard liquor (44 mL). Lifestyle  Take daily care of your teeth and gums.  Stay active. Exercise for at least 30 minutes on 5 or more days each week.  Do not use any products that contain nicotine or tobacco, such as cigarettes, e-cigarettes, and chewing tobacco. If you need help quitting, ask your health care provider.  If you are sexually active, practice safe sex. Use a condom or other form of birth control (contraception) in order to prevent pregnancy and STIs (sexually transmitted infections). If you plan to become pregnant, see your health care provider for a preconception visit. What's next?  Visit your health care provider once a year for a well check visit.  Ask your health care provider how often you should have your eyes and teeth checked.  Stay up to date on all vaccines. This information is not intended to replace advice given to you by your health care provider. Make sure you discuss any questions you have with your health care provider. Document Revised: 12/20/2017 Document Reviewed: 12/20/2017 Elsevier Patient Education  2020 [employer].

## 2019-05-12 NOTE — Progress Notes (Signed)
GYNECOLOGY ANNUAL PREVENTATIVE CARE ENCOUNTER NOTE  History:     Angela Knox is a 39 y.o. G41P1011 female here for a routine annual gynecologic exam.  Current complaints: no motivation, feels "blah", does not want to go to work or go get daughter.   Denies abnormal vaginal bleeding, discharge, pelvic pain, problems with intercourse or other gynecologic concerns.     Social History: Sexual: heterosexual Marital Status: married, 1 child ( girl Emmie) Living situation: with family Occupation: Geophysicist/field seismologist Tobacco/alcohol: no tobacco use Illicit drugs: no history of illicit drug use Exercise: none   Gynecologic History Patient's last menstrual period was 04/21/2019 (approximate). Contraception: OCP (estrogen/progesterone), changed to [facility] due to irregular bleeding  Last Pap: 05/02/2018. Results were: normal with negative HPV Last mammogram: n/a .   Obstetric History OB History  Gravida Para Term Preterm AB Living  2 1 1  0 1 1  SAB TAB Ectopic Multiple Live Births  1 0 0 0 1    # Outcome Date GA Lbr Len/2nd Weight Sex Delivery Anes PTL Lv  2 Term 12/23/17 [redacted]w[redacted]d / 02:36 7 lb 13.9 oz (3.57 kg) F Vag-Spont EPI  LIV  1 SAB 2018 [redacted]w[redacted]d           Past Medical History:  Diagnosis Date  . Depression   . Insomnia     Past Surgical History:  Procedure Laterality Date  . APPENDECTOMY      Current Outpatient Medications on File Prior to Visit  Medication Sig Dispense Refill  . cetirizine (ZYRTEC) 10 MG tablet Take 10 mg by mouth daily.    Marland Kitchen escitalopram (LEXAPRO) 20 MG tablet Take 1 tablet (20 mg total) by mouth daily. 30 tablet 6  . Norethindrone Acetate-Ethinyl Estradiol (JUNEL 1.5/30) 1.5-30 MG-MCG tablet Take 1 tablet by mouth daily. Taking continuously and omitting placebo pills 4 Package 4  . zolpidem (AMBIEN) 10 MG tablet Take 1 tablet (10 mg total) by mouth at bedtime as needed for sleep. 30 tablet 2  . ARIPiprazole (ABILIFY) 5 MG tablet Take 1 tablet (5 mg total) by  mouth daily. 30 tablet 3  . butalbital-acetaminophen-caffeine (FIORICET) 50-325-40 MG tablet Take 1-2 tablets by mouth every 6 (six) hours as needed for headache. (Patient not taking: Reported on 08/29/2018) 4 tablet 0  . ciprofloxacin (CILOXAN) 0.3 % ophthalmic solution Place 1 drop into both eyes every 2 (two) hours. Administer 1 drop, every 2 hours, while awake, for 2 days. Then 1 drop, every 4 hours, while awake, for the next 5 days. 5 mL 0  . cyclobenzaprine (FLEXERIL) 10 MG tablet 1 tab PO every HS as needed for muscle relaxation (Patient not taking: Reported on 07/02/2018) 20 tablet 0  . fluticasone (FLONASE) 50 MCG/ACT nasal spray Place 1 spray into both nostrils 2 (two) times daily. (Patient not taking: Reported on 08/29/2018) 16 g 6  . naproxen (NAPROSYN) 500 MG tablet 1 tab PO BID x 7 days with food, then BID PRN for pain (Patient not taking: Reported on 07/02/2018) 30 tablet 0  . promethazine-dextromethorphan (PROMETHAZINE-DM) 6.25-15 MG/5ML syrup Take 5 mLs by mouth 4 (four) times daily as needed for cough. 5 mls PO every 6 hours as needed for cough (MAY CAUSE DROWSINESS) 100 mL 0  . sodium chloride (OCEAN) 0.65 % SOLN nasal spray Place 2 sprays into both nostrils every 2 (two) hours while awake for 30 days.  0   No current facility-administered medications on file prior to visit.    Allergies  Allergen  Reactions  . Amoxicillin     Nausea and vomiting on 03/15/18  . Ciprofloxacin Other (See Comments)    Body aches  . Asa [Aspirin] Itching and Rash    Social History:  reports that she has never smoked. She has never used smokeless tobacco. She reports current alcohol use. She reports that she does not use drugs.  Family History  Problem Relation Age of Onset  . Heart disease Brother   . Kidney disease Brother   . Hypertension Brother   . Hyperlipidemia Father   . Cancer Maternal Grandmother     The following portions of the patient's history were reviewed and updated as  appropriate: allergies, current medications, past family history, past medical history, past social history, past surgical history and problem list.  Review of Systems Pertinent items noted in HPI and remainder of comprehensive ROS otherwise negative.  Physical Exam:  BP 113/78   Pulse 76   Ht 5\' 5"  (1.651 m)   Wt 217 lb 8 oz (98.7 kg)   LMP 04/21/2019 (Approximate)   BMI 36.19 kg/m  CONSTITUTIONAL: Well-developed, well-nourished, overweight, female in no acute distress.  HENT:  Normocephalic, atraumatic, External right and left ear normal. Oropharynx is clear and moist EYES: Conjunctivae and EOM are normal. Pupils are equal, round, and reactive to light. No scleral icterus.  NECK: Normal range of motion, supple, no masses.  Normal thyroid.  SKIN: Skin is warm and dry. No rash noted. Not diaphoretic. No erythema. No pallor. MUSCULOSKELETAL: Normal range of motion. No tenderness.  No cyanosis, clubbing, or edema.  2+ distal pulses. NEUROLOGIC: Alert and oriented to person, place, and time. Normal reflexes, muscle tone coordination.  PSYCHIATRIC: Normal mood and affect. Normal behavior. Normal judgment and thought content. CARDIOVASCULAR: Normal heart rate noted, regular rhythm RESPIRATORY: Clear to auscultation bilaterally. Effort and breath sounds normal, no problems with respiration noted. BREASTS: Symmetric in size. No masses, tenderness, skin changes, nipple drainage, or lymphadenopathy bilaterally. ABDOMEN: Soft, no distention noted.  No tenderness, rebound or guarding.  PELVIC: Normal appearing external genitalia and urethral meatus; normal appearing vaginal mucosa and cervix.  No abnormal discharge noted.  Pap smear not indicated  Normal uterine size, no other palpable masses, no uterine or adnexal tenderness.   Assessment and Plan:    1. Women's annual routine gynecological examination  Pap smear not due. Mammogram not indicated Labs: vitamin D Refills: new OCP ordered,  Ambien, Lexapro Referrals: PT , psychiatry Routine preventative health maintenance measures emphasized. Encouraged exercise for mood and weight loss. Please refer to After Visit Summary for other counseling recommendations.      04/23/2019, CNM

## 2019-05-13 ENCOUNTER — Other Ambulatory Visit: Payer: Self-pay | Admitting: Certified Nurse Midwife

## 2019-05-13 LAB — VITAMIN D 25 HYDROXY (VIT D DEFICIENCY, FRACTURES): Vit D, 25-Hydroxy: 17.5 ng/mL — ABNORMAL LOW (ref 30.0–100.0)

## 2019-05-13 MED ORDER — VITAMIN D (ERGOCALCIFEROL) 1.25 MG (50000 UNIT) PO CAPS: 50000.0000 [IU] | ORAL_CAPSULE | ORAL | 11 refills | Status: DC

## 2019-05-13 NOTE — Progress Notes (Signed)
Vitamin D deficiency, orders placed for treatment.   [name], CNM

## 2019-05-16 ENCOUNTER — Other Ambulatory Visit: Payer: Self-pay | Admitting: Certified Nurse Midwife

## 2019-05-16 MED ORDER — ETHYNODIOL DIAC-ETH ESTRADIOL 1-50 MG-MCG PO TABS: 1.0000 | ORAL_TABLET | Freq: Every day | ORAL | 11 refills | Status: DC

## 2019-05-20 ENCOUNTER — Ambulatory Visit: Payer: BC Managed Care – PPO | Attending: Internal Medicine

## 2019-05-20 ENCOUNTER — Telehealth: Payer: Self-pay | Admitting: *Deleted

## 2019-05-20 DIAGNOSIS — Z20822 Contact with and (suspected) exposure to covid-19: Secondary | ICD-10-CM

## 2019-05-20 NOTE — Telephone Encounter (Signed)
Angela Knox called [facility] today inquiring about a Flu test. She states she began with chills, body aches, congestion, T-99.4 early this morning. [facility] and wanted to know about getting a flu test here. Advised we would have to wait on results of Covid test; if she is negative and still having symptoms call clinic and we will have provider evaluate a virtual visit vs office visit with possible flu test. Encouraged her to increase fluids, tylenol for bodyaches, Mucinex to help with mucus, along with Claritin or Zyrtec to help treat symptoms. [name] verbalizes understanding of topics discussed and will call later this week if needed.

## 2019-05-21 LAB — NOVEL CORONAVIRUS, NAA: SARS-CoV-2, NAA: NOT DETECTED

## 2019-05-22 ENCOUNTER — Telehealth: Payer: Self-pay | Admitting: Nurse Practitioner

## 2019-05-22 ENCOUNTER — Other Ambulatory Visit: Payer: Self-pay

## 2019-05-22 DIAGNOSIS — R05 Cough: Secondary | ICD-10-CM

## 2019-05-22 DIAGNOSIS — J069 Acute upper respiratory infection, unspecified: Secondary | ICD-10-CM

## 2019-05-22 DIAGNOSIS — R059 Cough, unspecified: Secondary | ICD-10-CM

## 2019-05-22 DIAGNOSIS — B9789 Other viral agents as the cause of diseases classified elsewhere: Secondary | ICD-10-CM

## 2019-05-22 DIAGNOSIS — J329 Chronic sinusitis, unspecified: Secondary | ICD-10-CM

## 2019-05-22 MED ORDER — DOXYCYCLINE HYCLATE 100 MG PO TABS: 100.0000 mg | ORAL_TABLET | Freq: Two times a day (BID) | ORAL | 0 refills | Status: DC

## 2019-05-22 MED ORDER — PREDNISONE 20 MG PO TABS: ORAL_TABLET | ORAL | 0 refills | Status: DC

## 2019-05-22 MED ORDER — PROMETHAZINE-DM 6.25-15 MG/5ML PO SYRP: 5.0000 mL | ORAL_SOLUTION | Freq: Four times a day (QID) | ORAL | 0 refills | Status: DC | PRN

## 2019-05-22 NOTE — Patient Instructions (Addendum)
Adequate rest and fluids encouraged Start prednisone and take as directed and side effects discussed today I will refill the Promethazine DM to take as directed and remember it can cause drowsiness If no improvement or symptoms not significantly improving in the next 72 hours please get the antibiotic prescription Encouraged patient to call the office or primary care doctor for an appointment if no improvement in symptoms or if symptoms change or worsen after completion of recommended planned treatment. Patient verbalized understanding of all instructions given/reviewed and has no further questions or concerns at this time.

## 2019-05-22 NOTE — Progress Notes (Signed)
   Subjective:    Patient ID: Angela Knox, female    DOB: March 12, 1981, 39 y.o.   MRN: [id number]  HPI Angela Knox is on a virtual telemedicine visit today with consent to treat. She complains of sinus pressure productive coughing up green mucus and nonproductive cough. She reports her symptoms first started 5 days ago in which she had fatigue, body aches, congestion with sinus pressure. Continues to take Zyrtec. Denies fever, SOB. Was COVID tested 1/26 with reports of negative Covid test 05/21/19.  Review of Systems  Constitutional: Positive for fatigue. Negative for fever.  HENT: Positive for sinus pressure and sore throat.   Respiratory: Positive for cough.   Musculoskeletal: Positive for myalgias.       Objective:   Physical Exam  telemed visit      Assessment & Plan:

## 2019-05-30 ENCOUNTER — Other Ambulatory Visit: Payer: Self-pay | Admitting: Certified Nurse Midwife

## 2019-05-30 MED ORDER — NORETHIN ACE-ETH ESTRAD-FE 1-20 MG-MCG PO TABS: 1.0000 | ORAL_TABLET | Freq: Every day | ORAL | 11 refills | Status: DC

## 2019-05-30 NOTE — Progress Notes (Signed)
Pt request to go back on the [facility] she was on due to new BC causing nausea. Orders placed.   [name], CNM

## 2019-06-03 ENCOUNTER — Telehealth (HOSPITAL_COMMUNITY): Payer: Self-pay | Admitting: Licensed Clinical Social Worker

## 2019-06-03 ENCOUNTER — Ambulatory Visit (INDEPENDENT_AMBULATORY_CARE_PROVIDER_SITE_OTHER): Payer: BC Managed Care – PPO | Admitting: Licensed Clinical Social Worker

## 2019-06-03 ENCOUNTER — Other Ambulatory Visit: Payer: Self-pay

## 2019-06-03 DIAGNOSIS — F331 Major depressive disorder, recurrent, moderate: Secondary | ICD-10-CM | POA: Diagnosis not present

## 2019-06-03 NOTE — Telephone Encounter (Signed)
Cln called pt with follow up appointments post-[facility] appointment earlier today. Cln left message informing pt of scheduled therapy appt with Belinda Fisher on 2/15 at 9am, and psychiatry appointment with Dr Lolly Mustache on 3/4 at 1pm. Cln informed pt these will be virtual appts. Cln directed pt to MyChart to see scheduled appointments and the main # [phone] for any questions.
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
## 2019-06-04 ENCOUNTER — Other Ambulatory Visit: Payer: Self-pay | Admitting: Certified Nurse Midwife

## 2019-06-04 DIAGNOSIS — M791 Myalgia, unspecified site: Secondary | ICD-10-CM

## 2019-06-04 DIAGNOSIS — R5383 Other fatigue: Secondary | ICD-10-CM

## 2019-06-04 NOTE — Progress Notes (Signed)
Pt request referral to  Rheumatology for  tender points and fatigue. R/O fibromyalgia.  [name], CNM

## 2019-06-09 ENCOUNTER — Telehealth: Payer: BC Managed Care – PPO | Admitting: Medical

## 2019-06-09 ENCOUNTER — Other Ambulatory Visit: Payer: Self-pay

## 2019-06-09 ENCOUNTER — Encounter: Payer: Self-pay | Admitting: Medical

## 2019-06-09 ENCOUNTER — Ambulatory Visit (INDEPENDENT_AMBULATORY_CARE_PROVIDER_SITE_OTHER): Payer: BC Managed Care – PPO | Admitting: Licensed Clinical Social Worker

## 2019-06-09 DIAGNOSIS — F331 Major depressive disorder, recurrent, moderate: Secondary | ICD-10-CM

## 2019-06-09 DIAGNOSIS — M542 Cervicalgia: Secondary | ICD-10-CM

## 2019-06-09 MED ORDER — CYCLOBENZAPRINE HCL 5 MG PO TABS: 5.0000 mg | ORAL_TABLET | Freq: Every day | ORAL | 0 refills | Status: DC

## 2019-06-09 NOTE — Progress Notes (Signed)
  Massage on Wednesday, and then Thursday woke up with neck tight and trouble turn to the left from under ear to top of shoulder, no radiation into the arm , denies numbness or tingling or weakness of the arm. Denies injury or trauma. Prior similar  problem last year (05/2018).   Currently taking Tylenol after she felt Motrin was not helping.  Allergies  Allergen Reactions  . Amoxicillin     Nausea and vomiting on 03/15/18  . Ciprofloxacin Other (See Comments)    Body aches  . Asa [Aspirin] Itching and Rash    PE No physical exam was done by me due to telemedicine visit. However I did have patient do some ROM, painful with turning head to the left, better if she holds it to the right. Palpated from ear straight down to base of neck, no pain bilaterally. Her tenderness is on the left side behind that line. No vertebaral tenderness with palpation done by the patient.  She denies any swelling or erythema , however the are on the left side of midline is "tight feeling".  She can flex and extend her neck. But extension is tender unless she hold her head to the right slightly. No cough or shortness of breath is noted on the phone call.      A/P Left sided neck pain No radiation down arm. To take OTC Motrin 800mg  ( 4 x 200mg  tablets) every 8 hours with food x 7 days. Meds ordered this encounter  Medications  . cyclobenzaprine (FLEXERIL) 5 MG tablet    Sig: Take 1 tablet (5 mg total) by mouth at bedtime.    Dispense:  15 tablet    Refill:  0  Apply warm and cool compresses to the site every 10-15 minutes. Advised patient not to take Ambien with Flexeril since both are CNS  depressants and can act synergistically.reviewed with patient that cyclobenzaprine can cause sleepiness. She has used it before when her neck was strained last year. She is aware that if the pain is not improving in 3-5 dfays to call the office andd speak with me.  Patient verbalizes understanding of all instruction and  has no questions a the end of our conversation.

## 2019-06-09 NOTE — Patient Instructions (Signed)
My Chart information sent to patient.

## 2019-06-09 NOTE — Progress Notes (Signed)
Virtual Visit via Telephone Note  I connected with Angela Knox on 06/09/19 at  9:00 AM EST by telephone and verified that I am speaking with the correct person using two identifiers.   I discussed the limitations, risks, security and privacy concerns of performing an evaluation and management service by telephone and the availability of in person appointments. I also discussed with the patient that there may be a patient responsible charge related to this service. The patient expressed understanding and agreed to proceed.  I discussed the assessment and treatment plan with the patient. The patient was provided an opportunity to ask questions and all were answered. The patient agreed with the plan and demonstrated an understanding of the instructions.   The patient was advised to call back or seek an in-person evaluation if the symptoms worsen or if the condition fails to improve as anticipated.  I provided 45 minutes of non-face-to-face time during this encounter.   Renee Harder, LCSW      THERAPIST PROGRESS NOTE  Session Time: 9am-9:45am  Participation Level: Active  Behavioral Response: NAAlertEuthymic  Type of Therapy: Individual Therapy  Treatment Goals addressed: Anxiety, depression, boundaries  Interventions: Motivational Interviewing, solution focused, rapport building  Summary: Client presented alert and oriented for an initial individual session. Client provided information related to her referral for behavioral health services and reported her OBGYN referred her due to increasing anxiety and depression beginning 8-10 months ago. Client identified historic information related to her relationships, work, and personal life. Client identified supports and areas of growth that she would like to improve on. Client identified wanting to work on decreasing depressive and anxiety symptoms and increase healthy coping skills, boundaries, and effective communication. Client  agreed to engage in therapy 1x weekly and will be seen again by this clinician in one week. Client denies any current SI/HI, self-harming behaviors, or psychosis.  Suicidal/Homicidal: Nowithout intent/plan  Therapist Response: Clinician met with client for an individual therapy session. Clinician introduced self and provided orientation to therapy services. Clinician discussed confidentiality, mandated reporting, and assessed client's current symptoms which led to therapy referral. Clinician provided praise and validated client's feelings throughout session. Clinician utilized open ended questions and clarifying questions while assessing client's current supports and coping skills. Clinician assisted client in identifying areas of growth and agreed to see client again in approximately 1 week. Clinician will send WebEx link for this session. Clinician challenged client to engage in one act of self-care prior to our next session. Clinician assessed SI/HI, self-harming behaviors, and psychosis.   Plan: Return again in 1 week.  Diagnosis: Axis I: Major depressive disorder, recurrent episode, moderate         Renee Harder, LCSW 06/09/2019

## 2019-06-16 ENCOUNTER — Ambulatory Visit (INDEPENDENT_AMBULATORY_CARE_PROVIDER_SITE_OTHER): Payer: BC Managed Care – PPO | Admitting: Licensed Clinical Social Worker

## 2019-06-16 ENCOUNTER — Other Ambulatory Visit: Payer: Self-pay

## 2019-06-16 DIAGNOSIS — F331 Major depressive disorder, recurrent, moderate: Secondary | ICD-10-CM

## 2019-06-16 NOTE — Progress Notes (Signed)
Virtual Visit via Video Note  I connected with Airiana Mesmer on 06/16/19 at  9:00 AM EST by a video enabled telemedicine application and verified that I am speaking with the correct person using two identifiers.   I discussed the limitations of evaluation and management by telemedicine and the availability of in person appointments. The patient expressed understanding and agreed to proceed.  I discussed the assessment and treatment plan with the patient. The patient was provided an opportunity to ask questions and all were answered. The patient agreed with the plan and demonstrated an understanding of the instructions.   The patient was advised to call back or seek an in-person evaluation if the symptoms worsen or if the condition fails to improve as anticipated.  I provided 45 minutes of non-face-to-face time during this encounter.    , LCSW      THERAPIST PROGRESS NOTE  Session Time: 9am-9:45am  Participation Level: Active  Behavioral Response: Well GroomedAlertEuthymic  Type of Therapy: Individual Therapy  Treatment Goals addressed: Communication, self-esteem, depression  Interventions: CBT, motivational interviewing, solution focused  Summary: Client presented alert and oriented x5 for a scheduled therapy session. Client checked in and provided an update on her week since our last session. Client reported that she completed homework assigned during our last session and went to the grocery store alone while leaving her child with her husband. Client reports that she engaged in a discussion with her husband in which she relayed that she wants more time for herself and felt invalidated by her husband's response. Client acknowledged that her communication could have came across as an attack towards her husband however she struggles with identifying and relaying emotions/needs. Client was receptive to psycho-education on I-Statements and agrees that this may be helpful  in the relationships in her life. Client provided insight into her desire to increase intimacy with her husband and discussed her self-esteem as being an issue and struggled to identify personal strengths. Client showed progress towards treatment goes AEB completing homework assigned in therapy. Client denies SI/HI, psychosis.  Suicidal/Homicidal: Nowithout intent/plan  Therapist Response: Clinician met with client and checked in while assessing for any changes in mood. Clinician praised client for her willingness to engage as well as taking time to reflect on our last session and follow through with therapeutic homework. Clinician provided psycho-education on I-Statements and agreed to send client further resources on this skill as well as information on identifying primary and secondary emotions. Clinician encouraged client to utilize I-Statements 1x prior to next session. Clinician assessed client's self-esteem and prompted her to identify personal strengths. Clinician challenged client's thoughts related to her self-worth and value. Clinician validated client's feelings and encouraged her to continue to identify her personal strengths to utilize for herself and within relationships. Clinician agreed to see client again in 1 week.  Plan: Return again in 1 week.  Diagnosis: Axis I: MDD (major depressive disorder), recurrent episode, moderate (HCC)         , LCSW 06/16/2019  

## 2019-06-20 ENCOUNTER — Encounter: Payer: Self-pay | Admitting: Nurse Practitioner

## 2019-06-20 ENCOUNTER — Ambulatory Visit: Payer: BC Managed Care – PPO

## 2019-06-20 ENCOUNTER — Ambulatory Visit: Payer: BC Managed Care – PPO | Admitting: Nurse Practitioner

## 2019-06-20 ENCOUNTER — Other Ambulatory Visit: Payer: Self-pay

## 2019-06-20 VITALS — BP 135/77 | HR 106 | Temp 97.9°F | Resp 20 | Ht 65.0 in | Wt 222.0 lb

## 2019-06-20 DIAGNOSIS — M940 Chondrocostal junction syndrome [Tietze]: Secondary | ICD-10-CM

## 2019-06-20 DIAGNOSIS — R059 Cough, unspecified: Secondary | ICD-10-CM

## 2019-06-20 DIAGNOSIS — R05 Cough: Secondary | ICD-10-CM

## 2019-06-20 LAB — POC COVID19 BINAXNOW: SARS Coronavirus 2 Ag: NEGATIVE

## 2019-06-20 MED ORDER — PREDNISONE 20 MG PO TABS: ORAL_TABLET | ORAL | 0 refills | Status: DC

## 2019-06-20 NOTE — Patient Instructions (Addendum)
Angela Knox, it was great seeing you today! You seem to be improving well and your symptoms are resolving itself with what you've done I will give you a script to hold in case you have a cough that returns over the weekend and unable to calm the cough with use of hot tea/honey/lemon, Delsym and cough drops Remember you can take an NSAID with food if you need it and it may be a good idea for any inflammation If you develop shortness of breath, chest pain or any urgent symptoms report to the ED right away If your symptoms aren't significantly improved with what we discussed, please call me or return to the office for further evaluation.

## 2019-06-20 NOTE — Progress Notes (Signed)
   Subjective:    Patient ID: Angela Knox, female    DOB: [birth year], 39 y.o.   MRN: [id number]  HPI Angela Knox is here today with c/o persistent dry cough and sometimes moist but unable to produce any sputum. She reports the cough started 6 days ago and hasn't had any relief but today she feels better and hasn't coughed. She denies any other respiratory symptoms, sore throat, ear pain/pressure, SOB, wheezing, c/p, facial pain/pressure. She's been drinking tea and plenty of fluids.  Covid antigen test is negative.   Review of Systems  Constitutional: Negative for fatigue and fever.  HENT: Negative for congestion, ear pain, facial swelling, sinus pressure, sinus pain, sneezing and sore throat.   Respiratory: Positive for cough. Negative for shortness of breath and wheezing.   Cardiovascular: Negative for chest pain.       Objective:   Physical Exam Constitutional:      General: She is not in acute distress.    Appearance: Normal appearance. She is not ill-appearing.  HENT:     Head: Normocephalic and atraumatic.     Right Ear: Tympanic membrane normal. There is no impacted cerumen.     Left Ear: Tympanic membrane normal. There is no impacted cerumen.     Nose: Nose normal.     Mouth/Throat:     Mouth: Mucous membranes are moist.     Pharynx: Oropharynx is clear.  Cardiovascular:     Rate and Rhythm: Normal rate and regular rhythm.     Pulses: Normal pulses.     Heart sounds: Normal heart sounds. No murmur.  Pulmonary:     Effort: Pulmonary effort is normal.     Breath sounds: Normal breath sounds.  Abdominal:     General: Bowel sounds are normal.  Musculoskeletal:     Cervical back: Normal range of motion and neck supple.     Comments: Left lower rib tenderness on palpation between lateral 11 and 12th ICS  Lymphadenopathy:     Cervical: No cervical adenopathy.  Skin:    General: Skin is warm and dry.  Neurological:     Mental Status: She is alert and oriented to person,  place, and time.  Psychiatric:        Mood and Affect: Mood normal.           Assessment & Plan:

## 2019-06-24 ENCOUNTER — Ambulatory Visit (INDEPENDENT_AMBULATORY_CARE_PROVIDER_SITE_OTHER): Payer: BC Managed Care – PPO | Admitting: Licensed Clinical Social Worker

## 2019-06-24 ENCOUNTER — Other Ambulatory Visit: Payer: Self-pay

## 2019-06-24 DIAGNOSIS — F331 Major depressive disorder, recurrent, moderate: Secondary | ICD-10-CM

## 2019-06-24 NOTE — Progress Notes (Signed)
Virtual Visit via Video Note  I connected with Angela Knox on 06/24/19 at  9:00 AM EST by a video enabled telemedicine application and verified that I am speaking with the correct person using two identifiers.   I discussed the limitations of evaluation and management by telemedicine and the availability of in person appointments. The patient expressed understanding and agreed to proceed.   I discussed the assessment and treatment plan with the patient. The patient was provided an opportunity to ask questions and all were answered. The patient agreed with the plan and demonstrated an understanding of the instructions.   The patient was advised to call back or seek an in-person evaluation if the symptoms worsen or if the condition fails to improve as anticipated.  I provided 45 minutes of non-face-to-face time during this encounter.   Francine Graven, LCSW      THERAPIST PROGRESS NOTE  Session Time: 9am-9:45am  Participation Level: Active  Behavioral Response: Well GroomedAlertEuthymic  Type of Therapy: Individual Therapy  Treatment Goals addressed: Coping  Interventions: CBT, Motivational Interviewing, Solution Focused  Summary: Client presented alert and oriented x5 for today's session. Client provided an update and described her completion of homework assigned during last session. Client described an interaction in which she felt connected to her husband and successfully identified the feeling she felt in that moment. Client described herself as "the fixer" in relationships and detailed her struggle to assert herself and boundaries with others. Client provided a hx of pattern in which she has maintained this role and her desire to change this bx. Client identified personal strengths of: trustworthy, creative, respectful, friendly, empathetic, and funny. Client shows progress towards goals AEB completing therapeutic homework and reporting a decrease in depressive symptoms. Client  denied current SI/HI/psychosis.  Suicidal/Homicidal: Nowithout intent/plan  Therapist Response: Clinician joined client for today's session and assessed for significant changes in mood, current stressors, and SI/HI/psychosis. Clinician praised client's willingness to complete homework and further explored how this felt to identify feelings. Clinician validated client's emotions and introduced topic of boundaries. Clinician and client engaged in discussion on the discomfort in upholding boundaries but the helpfulness in meeting one's own needs. Clinician validated client's feelings throughout session and utilized CBT to challenge distorted thoughts surrounding feeling as though she has to take on other's conflict/struggles. Clinician scheduled client to be seen again on 07/28/19 at 11am.  Plan: Return again in 4 weeks.  Diagnosis: Axis I: MDD (major depressive disorder), recurrent episode, moderate (HCC)   Francine Graven, LCSW 06/24/2019

## 2019-06-25 ENCOUNTER — Other Ambulatory Visit: Payer: Self-pay | Admitting: Certified Nurse Midwife

## 2019-06-26 ENCOUNTER — Encounter (HOSPITAL_COMMUNITY): Payer: Self-pay | Admitting: Psychiatry

## 2019-06-26 ENCOUNTER — Telehealth (HOSPITAL_COMMUNITY): Payer: Self-pay | Admitting: *Deleted

## 2019-06-26 ENCOUNTER — Other Ambulatory Visit: Payer: Self-pay

## 2019-06-26 ENCOUNTER — Telehealth: Payer: Self-pay

## 2019-06-26 ENCOUNTER — Ambulatory Visit (INDEPENDENT_AMBULATORY_CARE_PROVIDER_SITE_OTHER): Payer: BC Managed Care – PPO | Admitting: Psychiatry

## 2019-06-26 ENCOUNTER — Telehealth: Payer: BC Managed Care – PPO | Admitting: Nurse Practitioner

## 2019-06-26 ENCOUNTER — Encounter: Payer: Self-pay | Admitting: Nurse Practitioner

## 2019-06-26 ENCOUNTER — Ambulatory Visit
Admission: RE | Admit: 2019-06-26 | Discharge: 2019-06-26 | Disposition: A | Discharge status: Home / Self Care | Payer: BC Managed Care – PPO | Source: Ambulatory Visit | Attending: Nurse Practitioner | Admitting: Nurse Practitioner

## 2019-06-26 DIAGNOSIS — J069 Acute upper respiratory infection, unspecified: Secondary | ICD-10-CM

## 2019-06-26 DIAGNOSIS — R05 Cough: Secondary | ICD-10-CM

## 2019-06-26 DIAGNOSIS — B9789 Other viral agents as the cause of diseases classified elsewhere: Secondary | ICD-10-CM

## 2019-06-26 DIAGNOSIS — R059 Cough, unspecified: Secondary | ICD-10-CM

## 2019-06-26 DIAGNOSIS — F331 Major depressive disorder, recurrent, moderate: Secondary | ICD-10-CM

## 2019-06-26 DIAGNOSIS — R0789 Other chest pain: Secondary | ICD-10-CM

## 2019-06-26 MED ORDER — LEVOCETIRIZINE DIHYDROCHLORIDE 5 MG PO TABS: 5.0000 mg | ORAL_TABLET | Freq: Every evening | ORAL | 0 refills | Status: DC

## 2019-06-26 MED ORDER — ALBUTEROL SULFATE HFA 108 (90 BASE) MCG/ACT IN AERS: INHALATION_SPRAY | RESPIRATORY_TRACT | 0 refills | Status: DC

## 2019-06-26 MED ORDER — BENZONATATE 100 MG PO CAPS: ORAL_CAPSULE | ORAL | 0 refills | Status: DC

## 2019-06-26 MED ORDER — VORTIOXETINE HBR 10 MG PO TABS: ORAL_TABLET | ORAL | 0 refills | Status: DC
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
## 2019-06-26 NOTE — Telephone Encounter (Signed)
Patient called the office with concerns about chest tightness.  She had a virtual visit this am and was sent for a chest xray. We are awaiting the results. Prescription was called in to the pharmacy for Albuterol and we recommended her to hold off on her Covid vaccine that is scheduled for tomorrow.  She verbalizes understanding and appointment has already been cancelled per patient.

## 2019-06-26 NOTE — Progress Notes (Signed)
   Subjective:    Patient ID: Angela Knox, female    DOB: 1981/02/16, 39 y.o.   MRN: [id number]  HPI Britt Boozer is on virtual telehealth visit today with reports cough has returned and what she's done isn't working. Was seen in office 2/26 with c/o persistent dry cough and sometimes moist x 6 days but unable to produce any sputum. When she arrived to the office it had resolved and supportative tx was recommended. However, she reports the cough returned that afternoon. She started the prednisone 2/27 and finished yesterday with no relief. She reports new symptoms of intermittant ear pain/facial pain. She denies sore throat, SOB, wheezing, c/p. She's been drinking tea and plenty of fluids, using Zyrtec and Flonase with no improvement and reports she had a temp of 99.9 3/2 that resolved within 24 hours. Was Covid tested 2/26 with negative Antigen.   Review of Systems  Constitutional: Positive for fever.  HENT: Positive for ear pain and sinus pain. Negative for sore throat.   Respiratory: Positive for cough.   Cardiovascular: Negative for chest pain.       Objective:   Physical Exam Responds to questions promptly and appropriately. Virtual visit       Assessment & Plan:

## 2019-06-26 NOTE — Telephone Encounter (Signed)
Thanks French Ana, per our discussion I have sent Albuterol in and we will see how she does as her CXR was negative.

## 2019-06-26 NOTE — Patient Instructions (Signed)
[name] continue to increase fluids and drink hot tea with honey and lemon Start the Xyzal as directed; it can cause drowsiness so that's why you will take it in the evening and hold Zyrtec Continue your Flonase 1 spray nasally twice daily I have sent in Tessalon Perrles and take as directed Please go get your CXR and I will call you with the results if we need to do anyting additionally Encouraged patient to call the office or primary care doctor for an appointment if no improvement in symptoms or if symptoms change or worsen after 72 hours of planned treatment. Patient verbalized understanding of all instructions given/reviewed and has no further questions or concerns at this time.

## 2019-06-26 NOTE — Addendum Note (Signed)
Addended by: Tresea Mall on: 06/26/2019 11:24 AM   Modules accepted: Orders

## 2019-06-26 NOTE — Telephone Encounter (Signed)
Writer spoke with pt to inform her that Trintellix 10mg  samples are available as soon as she can pick them up. Pt verbalized understanding and says it will probably be Monday (today is Thursday) before she can get here. Pt given office hours and phone #.

## 2019-07-04 ENCOUNTER — Ambulatory Visit: Payer: BC Managed Care – PPO

## 2019-07-08 DIAGNOSIS — J40 Bronchitis, not specified as acute or chronic: Secondary | ICD-10-CM | POA: Diagnosis not present

## 2019-07-08 DIAGNOSIS — R05 Cough: Secondary | ICD-10-CM | POA: Diagnosis not present

## 2019-07-14 ENCOUNTER — Other Ambulatory Visit: Payer: Self-pay

## 2019-07-16 ENCOUNTER — Other Ambulatory Visit (HOSPITAL_COMMUNITY): Payer: Self-pay | Admitting: Psychiatry

## 2019-07-16 DIAGNOSIS — F331 Major depressive disorder, recurrent, moderate: Secondary | ICD-10-CM

## 2019-07-16 DIAGNOSIS — R05 Cough: Secondary | ICD-10-CM | POA: Diagnosis not present

## 2019-07-21 ENCOUNTER — Ambulatory Visit: Payer: BC Managed Care – PPO | Admitting: Family Medicine

## 2019-07-21 ENCOUNTER — Other Ambulatory Visit: Payer: Self-pay | Admitting: Family Medicine

## 2019-07-21 ENCOUNTER — Other Ambulatory Visit: Payer: Self-pay

## 2019-07-21 ENCOUNTER — Encounter: Payer: Self-pay | Admitting: Family Medicine

## 2019-07-21 VITALS — BP 110/78 | HR 94 | Temp 97.8°F | Resp 16 | Ht 65.0 in | Wt 217.0 lb

## 2019-07-21 DIAGNOSIS — F339 Major depressive disorder, recurrent, unspecified: Secondary | ICD-10-CM | POA: Diagnosis not present

## 2019-07-21 DIAGNOSIS — E559 Vitamin D deficiency, unspecified: Secondary | ICD-10-CM

## 2019-07-21 DIAGNOSIS — F5104 Psychophysiologic insomnia: Secondary | ICD-10-CM | POA: Diagnosis not present

## 2019-07-21 DIAGNOSIS — D509 Iron deficiency anemia, unspecified: Secondary | ICD-10-CM

## 2019-07-21 DIAGNOSIS — R7989 Other specified abnormal findings of blood chemistry: Secondary | ICD-10-CM

## 2019-07-21 DIAGNOSIS — R000 Tachycardia, unspecified: Secondary | ICD-10-CM

## 2019-07-21 NOTE — Patient Instructions (Addendum)
Thank you for coming to the office today.  Consider other sleep medicine alternatives.  Can ask Dr Lolly Mustache about the following medicine options: - Consider mood related sleep meds such as Trazodone or Mirtazapine  - Or if you want to keep the same category of med as the Ambien, can switch to option like a Dayvigo med, newer sleep med should be taken nightly can help maintain sleep, can work on an authorization for this one. - Or possibly an Ambien CR which is a continued release works better throughout the night.  Heart rate, most likely combination - Deconditioning, Caffeine, reduced sleep, could be iron deficiency, possibly thyroid but less likely  DUE for FASTING BLOOD WORK (no food or drink after midnight before the lab appointment, only water or coffee without cream/sugar on the morning of)  SCHEDULE "Lab Only" visit in the morning at the clinic for lab draw in 3 WEEKS   - Make sure Lab Only appointment is at about 1 week before your next appointment, so that results will be available  For Lab Results, once available within 2-3 days of blood draw, you can can log in to MyChart online to view your results and a brief explanation. Also, we can discuss results at next follow-up visit.   Sleep Hygiene Recommendations to promote healthy sleep in all patients, especially if symptoms of insomnia are worsening. Due to the nature of sleep rhythms, if your body gets "out of rhythm", it may take some time before your sleep cycle can be "reset".  Please try to follow as many of the following tips as you can, usually there are only a few of these are the primary cause of the problem.  ?To reset your sleep rhythm, go to bed and get up at the same time every day ?Sleep only long enough to feel rested and then get out of bed ?Do not try to force yourself to sleep. If you can't sleep, get out of bed and try again later. ?Avoid naps during the day, unless excessively tired. The more sleeping during  the day, then the less sleep your body needs at night.  ?Have coffee, tea, and other foods that have caffeine only in the morning ?Exercise several days a week, but not right before bed ?If you drink alcohol, prefer to have appropriate drink with one meal, but prefer to avoid alcohol in the evening, and bedtime ?If you smoke, avoid smoking, especially in the evening  ?Avoid watching TV or looking at phones, computers, or reading devices ("e-books") that give off light at least 30 minutes before bed. This artificial light sends "awake signals" to your brain and can make it harder to fall asleep. ?Make your bedroom a comfortable place where it is easy to fall asleep: ? Put up shades or special blackout curtains to block light from outside. ? Use a white noise machine to block noise. ? Keep the temperature cool. ?Try your best to solve or at least address your problems before you go to bed ?Use relaxation techniques to manage stress. Ask your health care provider to suggest some techniques that may work well for you. These may include: ? Breathing exercises. ? Routines to release muscle tension. ? Visualizing peaceful scenes.   Please schedule a Follow-up Appointment to: Return in about 4 weeks (around 08/18/2019) for Follow-up lab results / heart rate / depression insomnia.  If you have any other questions or concerns, please feel free to call the office or send a message through  MyChart. You may also schedule an earlier appointment if necessary.  Additionally, you may be receiving a survey about your experience at our office within a few days to 1 week by e-mail or mail. We value your feedback.  Angela Putnam, DO [facility]

## 2019-07-21 NOTE — Assessment & Plan Note (Signed)
See A&P Depression 

## 2019-07-21 NOTE — Progress Notes (Signed)
Subjective:    Patient ID: Angela Knox, female    DOB: 09-09-1980, 39 y.o.   MRN: [id number]  Angela Knox is a 39 y.o. female presenting on 07/21/2019 for Establish Care (tachycardia) and Depression   HPI   Major Depression chronic recurrent Chronic history of depression, she was having worsening mood >5 years or more up to 2012 in past, but it was manageable on Lexapro, Wellbutrin, Prozac and by her GYN at that time.. She had done fairly well until COVID19 pandemic then worsening again. She was referred to Psychiatry recently and first apt has completed with 06/26/19 Dr Kathryne [facility] Psychiatry), she was given instructions to taper off Lexapro and switch to Trintellix and continue w psychology therapist. - Today she does think her mood is improved on new med overall. But has concerns with the sleep issue see below. - Family history of depression significant father and sister - She does not really endorse symptoms of anxiety.  Insomnia Chronic problem >5-6 years, worse problem with difficulty falling asleep, usually related to anxiety and mental health and thinking. She has been on Ambien for several years about 5 years, on 10mg  nightly taking most nights, rx from GYN with good results, until lately now waking up some at night, unsure if related to new psych med.  Elevated Heart Rate / Tachycardia Reports relatively new issue in past 1 year. Now in Spring recently started on walking and improving lifestyle. But has not been doing that long She monitors heart rate on apple wrist watch, keeps track of it now. Drinks decent amount of caffeine and it can raise her HR Prior TSH had mild low 0.336 (05/2017) then normal in 04/2018. Did not have lab done in 04/2019 Denies heart flutter or palpitations, chest pain dyspnea sweating, temperature changes  History of Coughing Was on cough medicine, Breztri and Albuterol, now has improved No history of asthma.  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 Updated. Had some heart racing after.  Depression screen [facility] 2/9 07/21/2019 08/29/2018 01/30/2018  Decreased Interest 1 3 0  Down, Depressed, Hopeless 1 3 0  PHQ - 2 Score 2 6 0  Altered sleeping 3 3 0  Tired, decreased energy 1 3 1   Change in appetite 1 3 0  Feeling bad or failure about yourself  0 0 0  Trouble concentrating 1 1 0  Moving slowly or fidgety/restless 0 0 0  Suicidal thoughts 0 0 0  PHQ-9 Score 8 16 1   Difficult doing work/chores Not difficult at all Somewhat difficult Not difficult at all   GAD 7 : Generalized Anxiety Score 07/21/2019  Nervous, Anxious, on Edge 1  Control/stop worrying 1  Worry too much - different things 1  Trouble relaxing 2  Restless 2  Easily annoyed or irritable 1  Afraid - awful might happen 1  Total GAD 7 Score 9  Anxiety Difficulty Not difficult at all      Past Medical History:  Diagnosis Date  . Anxiety   . Depression   . Insomnia   . Tachycardia    Past Surgical History:  Procedure Laterality Date  . APPENDECTOMY     Social History   Socioeconomic History  . Marital status: Married    Spouse name: Not on file  . Number of children: Not on file  . Years of education: Not on file  .  Highest education level: Not on file  Occupational History  . 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 Smoking status: Never Smoker  . Smokeless tobacco: Never Used  Substance and Sexual Activity  . Alcohol use: Yes    Alcohol/week: 1.0 standard drinks    Types: 1 Standard drinks or equivalent per week    Comment: occasional  . Drug use: No  . Sexual activity: Yes    Birth control/protection: Pill    Comment: 2/2 to PFM issues  Other Topics Concern  . Not on file  Social History Narrative            Social Determinants of Health   Financial Resource Strain:     . Difficulty of Paying Living Expenses:   Food Insecurity:   . Worried About Programme researcher, broadcasting/film/video in the Last Year:   . Barista in the Last Year:   Transportation Needs:   . Freight forwarder (Medical):   Marland Kitchen Lack of Transportation (Non-Medical):   Physical Activity:   . Days of Exercise per Week:   . Minutes of Exercise per Session:   Stress:   . Feeling of Stress :   Social Connections:   . Frequency of Communication with Friends and Family:   . Frequency of Social Gatherings with Friends and Family:   . Attends Religious Services:   . Active Member of Clubs or Organizations:   . Attends Banker Meetings:   Marland Kitchen Marital Status:   Intimate Partner Violence:   . Fear of Current or Ex-Partner:   . Emotionally Abused:   Marland Kitchen Physically Abused:   . Sexually Abused:    Family History  Problem Relation Age of Onset  . Heart disease Brother   . Kidney disease Brother   . Hypertension Brother   . Hyperlipidemia Father   . Depression Father   . Cancer Maternal Grandmother   . Colon polyps Mother   . Depression Sister    Current Outpatient Medications on File Prior to Visit  Medication Sig  . BREZTRI AEROSPHERE 160-9-4.8 MCG/ACT AERO   . ipratropium (ATROVENT) 0.06 % nasal spray Place 2 sprays into both nostrils 2 (two) times daily.  . montelukast (SINGULAIR) 10 MG tablet Take by mouth.  . norethindrone-ethinyl estradiol (LOESTRIN FE) 1-20 MG-MCG tablet Take 1 tablet by mouth daily.  . Vitamin D, Ergocalciferol, (DRISDOL) 1.25 MG (50000 UNIT) CAPS capsule Take 50,000 Units by mouth once a week.  . vortioxetine HBr (TRINTELLIX) 10 MG TABS tablet Take 1/2 tab daily for one week and than full tab daily  . zolpidem (AMBIEN) 10 MG tablet Take 1 tablet (10 mg total) by mouth at bedtime as needed for sleep.  . cetirizine (ZYRTEC) 10 MG tablet Take 10 mg by mouth daily.  Marland Kitchen levocetirizine (XYZAL) 5 MG tablet Take 1 tablet (5 mg total) by mouth every evening. Hold Zyrtec    No current facility-administered medications on file prior to visit.    Review of Systems Per HPI unless specifically indicated above      Objective:    BP 110/78   Pulse 94   Temp 97.8 F (36.6 C) (Temporal)   Resp 16   Ht 5\' 5"  (1.651 m)   Wt 217 lb (98.4 kg)   SpO2 100%   BMI 36.11 kg/m   Wt Readings from Last 3 Encounters:  07/21/19 217 lb (98.4 kg)  06/20/19 222 lb (  100.7 kg)  05/12/19 217 lb 8 oz (98.7 kg)    Physical Exam Vitals and nursing note reviewed.  Constitutional:      General: She is not in acute distress.    Appearance: She is well-developed. She is obese. She is not diaphoretic.     Comments: Well-appearing, comfortable, cooperative  HENT:     Head: Normocephalic and atraumatic.  Eyes:     General:        Right eye: No discharge.        Left eye: No discharge.     Conjunctiva/sclera: Conjunctivae normal.  Neck:     Thyroid: No thyromegaly.  Cardiovascular:     Rate and Rhythm: Regular rhythm. Tachycardia present.     Heart sounds: Normal heart sounds. No murmur.  Pulmonary:     Effort: Pulmonary effort is normal. No respiratory distress.     Breath sounds: Normal breath sounds. No wheezing or rales.  Musculoskeletal:        General: Normal range of motion.     Cervical back: Normal range of motion and neck supple.  Lymphadenopathy:     Cervical: No cervical adenopathy.  Skin:    General: Skin is warm and dry.     Findings: No erythema or rash.  Neurological:     Mental Status: She is alert and oriented to person, place, and time.  Psychiatric:        Behavior: Behavior normal.     Comments: Well groomed, good eye contact, normal speech and thoughts      Results for orders placed or performed in visit on 06/20/19  POC COVID-19  Result Value Ref Range   SARS Coronavirus 2 Ag Negative Negative      Assessment & Plan:   Problem List Items Addressed This Visit    Vitamin D deficiency   Tachycardia   Major depression, recurrent,  chronic (HCC) - Primary    Improved mood Chronic major depression recurrent See PHQ Limited anxiety. Has secondary complication w chronic insomnia mostly sleep onset, now some sleep latency issue. Long term use of Ambien 10mg  nightly for several years Prior meds Lexapro, Prozac, Wellbutrin  Plan Followed now by Dr , recently tapered of Lexapro now on Trintellix Advised her to f/u with him and therapist as planned Consider reviewing options for insomnia in future - such as trazodone, mirtazapine, or consider other hypnotic agent such as Dayvigo for daily use      Iron deficiency anemia   Insomnia    See A&P Depression         #Tachycardia IDA / Vitamin D deficiency  Seems now fairly chronic issue over past >1 year w often having tachycardia, in office and at home readings Chart review, demonstrated known IDA and prior low ferritin, also had mild low TSH reading at risk of hyperthyroid but then had normal repeat TSH in 2020. - Also factors with exercise deconditioning now resuming activity - Caffeine intake can contribute No other concerning cardiac symptoms or other features that are concerning presenting at this time.  Reassurance. Monitor HR. Check labs as ordered 3-4 weeks - for further diagnostic Limit caffeine. Improve regular cardio exercise goal wt loss  No orders of the defined types were placed in this encounter.   Follow up plan: Return in about 4 weeks (around 08/18/2019) for Follow-up lab results / heart rate / depression insomnia.   Future labs ordered 08/11/19 - TSH Free T4, CBC, CMET, Anemia, Vitamin D,  Nobie Putnam, [facility] 07/21/2019, 3:28 PM

## 2019-07-21 NOTE — Assessment & Plan Note (Signed)
Improved mood Chronic major depression recurrent See PHQ Limited anxiety. Has secondary complication w chronic insomnia mostly sleep onset, now some sleep latency issue. Long term use of Ambien 10mg  nightly for several years Prior meds Lexapro, Prozac, Wellbutrin  Plan Followed now by Dr , recently tapered of Lexapro now on Trintellix Advised her to f/u with him and therapist as planned Consider reviewing options for insomnia in future - such as trazodone, mirtazapine, or consider other hypnotic agent such as Dayvigo for daily use

## 2019-07-24 ENCOUNTER — Encounter (HOSPITAL_COMMUNITY): Payer: Self-pay | Admitting: Psychiatry

## 2019-07-24 ENCOUNTER — Ambulatory Visit (INDEPENDENT_AMBULATORY_CARE_PROVIDER_SITE_OTHER): Payer: BC Managed Care – PPO | Admitting: Psychiatry

## 2019-07-24 ENCOUNTER — Other Ambulatory Visit: Payer: Self-pay

## 2019-07-24 VITALS — Wt 217.0 lb

## 2019-07-24 DIAGNOSIS — F331 Major depressive disorder, recurrent, moderate: Secondary | ICD-10-CM

## 2019-07-24 DIAGNOSIS — F5101 Primary insomnia: Secondary | ICD-10-CM | POA: Diagnosis not present

## 2019-07-24 MED ORDER — VORTIOXETINE HBR 10 MG PO TABS: ORAL_TABLET | ORAL | 1 refills | Status: DC

## 2019-07-24 MED ORDER — TRAZODONE HCL 100 MG PO TABS: ORAL_TABLET | ORAL | 1 refills | Status: DC

## 2019-07-24 NOTE — Progress Notes (Signed)
Virtual Visit via Telephone Note  I connected with Angela Knox on 07/24/19 at  2:00 PM EDT by telephone and verified that I am speaking with the correct person using two identifiers.   I discussed the limitations, risks, security and privacy concerns of performing an evaluation and management service by telephone and the availability of in person appointments. I also discussed with the patient that there may be a patient responsible charge related to this service. The patient expressed understanding and agreed to proceed.   History of Present Illness: Patient was evaluated by phone session.  She is a 39 year old Caucasian employed married female who was seen first time 4 weeks ago as she was referred from her therapist and OB/GYN.  She was taking Lexapro but she continues to feel lack of motivation, sadness, decreased energy and anxiety and depression.  We started her on Trintellix.  She is taking 10 mg in the morning.  She noticed improvement in her mood.  She denies any major anxiety and noticed that she start walking at least 3 days a week.  She feels more energetic and denies any crying spells or any feeling of hopelessness.  However she does feel nauseous in the morning.  She also noticed now and it is no longer working.  She struggle with insomnia.  Recently she has seen her PCP and her heart rate was 104.  Her PCP is doing work-up to rule out tachycardia.  She also reported since started Trintellix she is not as impulsive, irritable and her mood is much better.  She lives with her husband and 60-month-old daughter.  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  Other than nausea she does not have any side effects.  She has no tremors, shakes.   Past Psychiatric History: H/O depression and anxiety since 2012.  Took Wellbutrin than Prozac. After side effects from Prozac switched to Lexapro.  No h/o inpatient, suicidal attempt, psychosis or abuse.  Never seen  psychiatrist before.   Psychiatric Specialty Exam: Physical Exam  Review of Systems  Weight 217 lb (98.4 kg), not currently breastfeeding.There is no height or weight on file to calculate BMI.  General Appearance: NA  Eye Contact:  NA  Speech:  Clear and Coherent  Volume:  Normal  Mood:  Anxious  Affect:  NA  Thought Process:  Goal Directed  Orientation:  Full (Time, Place, and Person)  Thought Content:  WDL  Suicidal Thoughts:  No  Homicidal Thoughts:  No  Memory:  Immediate;   Good Recent;   Good Remote;   Good  Judgement:  Good  Insight:  Present  Psychomotor Activity:  NA  Concentration:  Concentration: Fair and Attention Span: Fair  Recall:  Good  Fund of Knowledge:  Good  Language:  Good  Akathisia:  No  Handed:  Right  AIMS (if indicated):     Assets:  Communication Skills Desire for Improvement Housing Physical Health Resilience Social Support Talents/Skills Transportation  ADL's:  Intact  Cognition:  WNL  Sleep:   fair.       Assessment and Plan: Major depressive disorder, recurrent.  Primary insomnia.  I reviewed notes from her PCP.  She is actually doing better on Trintellix 10 mg however having nausea.  I recommend to take at bedtime to help her nausea since Trintellix had helped her depression and mood.  I recommend discontinuing Ambien which is prescribed by PCP and try trazodone 50-100 mg at bedtime  to help insomnia.  Encouraged to continue therapy with Leandro Reasoner.  If patient continues to have nausea then we will consider switching her antidepressant.  Discussed medication side effects and benefits.  I will forward my note to her PCP.  Recommended to call us back if she has any question of any concern.  Follow-up in 6 weeks.  Follow Up Instructions:    I discussed the assessment and treatment plan with the patient. The patient was provided an opportunity to ask questions and all were answered. The patient agreed with the plan and demonstrated an  understanding of the instructions.   The patient was advised to call back or seek an in-person evaluation if the symptoms worsen or if the condition fails to improve as anticipated.  I provided 20 minutes of non-face-to-face time during this encounter.   Kathlee Nations, MD

## 2019-07-28 ENCOUNTER — Other Ambulatory Visit: Payer: Self-pay

## 2019-07-28 ENCOUNTER — Ambulatory Visit (INDEPENDENT_AMBULATORY_CARE_PROVIDER_SITE_OTHER): Payer: BC Managed Care – PPO | Admitting: Licensed Clinical Social Worker

## 2019-07-28 DIAGNOSIS — F331 Major depressive disorder, recurrent, moderate: Secondary | ICD-10-CM | POA: Diagnosis not present

## 2019-07-28 DIAGNOSIS — F411 Generalized anxiety disorder: Secondary | ICD-10-CM

## 2019-07-29 ENCOUNTER — Other Ambulatory Visit (HOSPITAL_COMMUNITY): Payer: Self-pay | Admitting: Psychiatry

## 2019-07-29 DIAGNOSIS — F5101 Primary insomnia: Secondary | ICD-10-CM

## 2019-07-29 MED ORDER — ZOLPIDEM TARTRATE 10 MG PO TABS: 10.0000 mg | ORAL_TABLET | Freq: Every evening | ORAL | 0 refills | Status: DC | PRN

## 2019-07-29 NOTE — Progress Notes (Signed)
Patient called and reported that she is not sleeping with trazodone.  She even try 100 mg trazodone but did not go to sleep.  She like to go back on Ambien which she used to take and had better sleep.  We will discontinue trazodone and try Ambien 10 mg at bedtime however I recommend if she feels that Ambien stop working then we can try Ambien CR.  She agreed and let us know if needed Ambien CR.  We will call Ambien 10 mg to take 1 tablet at bedtime for sleep as needed.

## 2019-07-31 NOTE — Progress Notes (Signed)
Virtual Visit via Video Note  I connected with Lennart Pall on 07/31/19 at 11:00 AM EDT by a video enabled telemedicine application and verified that I am speaking with the correct person using two identifiers.   The patient was advised to call back or seek an in-person evaluation if the symptoms worsen or if the condition fails to improve as anticipated.  I provided 30 minutes of non-face-to-face time during this encounter.   Francine Graven, LCSW    THERAPIST PROGRESS NOTE  Session Time: 11am-11:30am  Participation Level: Active  Behavioral Response: Well GroomedAlertEuthymic  Type of Therapy: Individual Therapy  Treatment Goals addressed: Coping  Interventions: CBT, Motivational Interviewing and Supportive  Summary: Client presented alert and oriented x5 for today's session. Client presented with a brighter mood and congruent affect. Client was smiling throughout session and reported feeling happier since our last session. Client relayed that when meeting with the Dr., he also mentioned her affect being more pleasant and that she has felt less stressed. Client discussed feeling supported by her husband who has allowed her time to herself and processed her feeling more comfortable in setting boundaries and identifying personal needs. Client reported she has started walking 3x weekly and has been intentional on checking in with her husband and increasing communication with him. Client denied any recent stressors and agreed to be seen again in 2-3 weeks. Client denied SI/HI/psychosis.  Suicidal/Homicidal: Nowithout intent/plan  Therapist Response: Clinician joined client for scheduled session and facilitated check in assessing for recent stressors, changes in mood, and SI/HI/psychosis. Clinician acknowledged improvement in mood that is noticeable by client's tone and overall affect and smile. Clinician validated feelings and assessed for specific skills or changes client has made  since last session. Clinician praised client's intentionality in prioritizing self-care and communication with others. Clinician and client processed feelings and agreed to speak again on 08/18/19.  Plan: Return again in 2-3 weeks.  Diagnosis: Axis I: MDD, GAD        Francine Graven, LCSW 07/31/2019

## 2019-08-06 NOTE — Progress Notes (Deleted)
Office Visit Note  Patient: Angela Knox             Date of Birth: 05/30/1980           MRN: [id number]             PCP: Smitty Cords, DO Referring: [name], CNM Visit Date: 08/14/2019 Occupation: @GUAROCC @  Subjective:  No chief complaint on file.   History of Present Illness: Angela Knox is a 39 y.o. female ***   Activities of Daily Living:  Patient reports morning stiffness for *** {minute/hour:19697}.   Patient {ACTIONS;DENIES/REPORTS:21021675::"Denies"} nocturnal pain.  Difficulty dressing/grooming: {ACTIONS;DENIES/REPORTS:21021675::"Denies"} Difficulty climbing stairs: {ACTIONS;DENIES/REPORTS:21021675::"Denies"} Difficulty getting out of chair: {ACTIONS;DENIES/REPORTS:21021675::"Denies"} Difficulty using hands for taps, buttons, cutlery, and/or writing: {ACTIONS;DENIES/REPORTS:21021675::"Denies"}  No Rheumatology ROS completed.   PMFS History:  Patient Active Problem List   Diagnosis Date Noted  . Iron deficiency anemia 07/21/2019  . Tachycardia 07/21/2019  . Vitamin D deficiency 01/31/2018  . Major depression, recurrent, chronic (HCC)   . Insomnia     Past Medical History:  Diagnosis Date  . Anxiety   . Depression   . Insomnia   . Tachycardia     Family History  Problem Relation Age of Onset  . Heart disease Brother   . Kidney disease Brother   . Hypertension Brother   . Hyperlipidemia Father   . Depression Father   . Cancer Maternal Grandmother   . Colon polyps Mother   . Depression Sister    Past Surgical History:  Procedure Laterality Date  . APPENDECTOMY     Social History   Social History Narrative            Immunization History  Administered Date(s) Administered  . Influenza,inj,Quad PF,6+ Mos 01/30/2018, 02/07/2019  . Influenza-Unspecified 01/02/2017  . Moderna SARS-COVID-2 Vaccination 07/11/2019  . Tdap 10/03/2017     Objective: Vital Signs: There were no vitals taken for this visit.   Physical  Exam   Musculoskeletal Exam: ***  CDAI Exam: CDAI Score: -- Patient Global: --; Provider Global: -- Swollen: --; Tender: -- Joint Exam 08/14/2019   No joint exam has been documented for this visit   There is currently no information documented on the homunculus. Go to the Rheumatology activity and complete the homunculus joint exam.  Investigation: No additional findings.  Imaging: No results found.  Recent Labs: Lab Results  Component Value Date   WBC 6.5 05/02/2018   HGB 11.9 05/02/2018   PLT 300 05/02/2018   NA 141 05/02/2018   K 4.4 05/02/2018   CL 103 05/02/2018   CO2 24 05/02/2018   GLUCOSE 76 05/02/2018   BUN 11 05/02/2018   CREATININE 0.85 05/02/2018   BILITOT <0.2 05/02/2018   ALKPHOS 88 05/02/2018   AST 10 05/02/2018   ALT 8 05/02/2018   PROT 6.5 05/02/2018   ALBUMIN 3.8 05/02/2018   CALCIUM 8.8 05/02/2018   GFRAA 103 05/02/2018    Speciality Comments: No specialty comments available.  Procedures:  No procedures performed Allergies: Amoxicillin, Ciprofloxacin, and Asa [aspirin]   Assessment / Plan:     Visit Diagnoses: No diagnosis found.  Orders: No orders of the defined types were placed in this encounter.  No orders of the defined types were placed in this encounter.   Face-to-face time spent with patient was *** minutes. Greater than 50% of time was spent in counseling and coordination of care.  Follow-Up Instructions: No follow-ups on file.   07/01/2018, PA-C  Note -  This record has been created using [employer].  Chart creation errors have been sought, but may not always  have been located. Such creation errors do not reflect on  the standard of medical care.

## 2019-08-11 ENCOUNTER — Other Ambulatory Visit: Payer: BC Managed Care – PPO

## 2019-08-11 ENCOUNTER — Other Ambulatory Visit: Payer: Self-pay

## 2019-08-11 DIAGNOSIS — F339 Major depressive disorder, recurrent, unspecified: Secondary | ICD-10-CM | POA: Diagnosis not present

## 2019-08-11 DIAGNOSIS — E559 Vitamin D deficiency, unspecified: Secondary | ICD-10-CM | POA: Diagnosis not present

## 2019-08-11 DIAGNOSIS — R7989 Other specified abnormal findings of blood chemistry: Secondary | ICD-10-CM

## 2019-08-11 DIAGNOSIS — R000 Tachycardia, unspecified: Secondary | ICD-10-CM

## 2019-08-11 DIAGNOSIS — D509 Iron deficiency anemia, unspecified: Secondary | ICD-10-CM

## 2019-08-11 LAB — CBC WITH DIFFERENTIAL/PLATELET
Absolute Monocytes: 397 cells/uL (ref 200–950)
Basophils Absolute: 43 cells/uL (ref 0–200)
Basophils Relative: 0.7 %
Eosinophils Absolute: 122 cells/uL (ref 15–500)
Eosinophils Relative: 2 %
HCT: 37.2 % (ref 35.0–45.0)
Hemoglobin: 12.2 g/dL (ref 11.7–15.5)
Lymphs Abs: 1653 cells/uL (ref 850–3900)
MCH: 28.3 pg (ref 27.0–33.0)
MCHC: 32.8 g/dL (ref 32.0–36.0)
MCV: 86.3 fL (ref 80.0–100.0)
MPV: 11.5 fL (ref 7.5–12.5)
Monocytes Relative: 6.5 %
Neutro Abs: 3886 cells/uL (ref 1500–7800)
Neutrophils Relative %: 63.7 %
Platelets: 303 10*3/uL (ref 140–400)
RBC: 4.31 10*6/uL (ref 3.80–5.10)
RDW: 12.4 % (ref 11.0–15.0)
Total Lymphocyte: 27.1 %
WBC: 6.1 10*3/uL (ref 3.8–10.8)

## 2019-08-11 LAB — COMPLETE METABOLIC PANEL WITH GFR
AG Ratio: 1.4 (calc) (ref 1.0–2.5)
ALT: 7 U/L (ref 6–29)
AST: 10 U/L (ref 10–30)
Albumin: 3.7 g/dL (ref 3.6–5.1)
Alkaline phosphatase (APISO): 80 U/L (ref 31–125)
BUN: 10 mg/dL (ref 7–25)
CO2: 26 mmol/L (ref 20–32)
Calcium: 8.8 mg/dL (ref 8.6–10.2)
Chloride: 106 mmol/L (ref 98–110)
Creat: 0.81 mg/dL (ref 0.50–1.10)
GFR, Est African American: 108 mL/min/{1.73_m2} (ref >=60)
GFR, Est Non African American: 93 mL/min/{1.73_m2} (ref >=60)
Globulin: 2.6 g/dL (calc) (ref 1.9–3.7)
Glucose, Bld: 84 mg/dL (ref 65–99)
Potassium: 4.1 mmol/L (ref 3.5–5.3)
Sodium: 140 mmol/L (ref 135–146)
Total Bilirubin: 0.2 mg/dL (ref 0.2–1.2)
Total Protein: 6.3 g/dL (ref 6.1–8.1)

## 2019-08-11 LAB — TSH: TSH: 1.66 mIU/L

## 2019-08-11 LAB — IRON,TIBC AND FERRITIN PANEL
%SAT: 28 % (calc) (ref 16–45)
Ferritin: 21 ng/mL (ref 16–154)
Iron: 102 ug/dL (ref 40–190)
TIBC: 368 mcg/dL (calc) (ref 250–450)

## 2019-08-11 LAB — VITAMIN D 25 HYDROXY (VIT D DEFICIENCY, FRACTURES): Vit D, 25-Hydroxy: 69 ng/mL (ref 30–100)

## 2019-08-11 LAB — T4, FREE: Free T4: 1.1 ng/dL (ref 0.8–1.8)

## 2019-08-14 ENCOUNTER — Ambulatory Visit: Payer: BC Managed Care – PPO | Admitting: Rheumatology

## 2019-08-18 ENCOUNTER — Other Ambulatory Visit: Payer: Self-pay

## 2019-08-18 ENCOUNTER — Ambulatory Visit (HOSPITAL_COMMUNITY): Payer: BC Managed Care – PPO | Admitting: Licensed Clinical Social Worker

## 2019-08-18 ENCOUNTER — Ambulatory Visit: Payer: BC Managed Care – PPO | Admitting: Family Medicine

## 2019-09-02 ENCOUNTER — Other Ambulatory Visit: Payer: Self-pay

## 2019-09-02 ENCOUNTER — Encounter (HOSPITAL_COMMUNITY): Payer: Self-pay | Admitting: Psychiatry

## 2019-09-02 ENCOUNTER — Telehealth (INDEPENDENT_AMBULATORY_CARE_PROVIDER_SITE_OTHER): Payer: BC Managed Care – PPO | Admitting: Psychiatry

## 2019-09-02 DIAGNOSIS — F5101 Primary insomnia: Secondary | ICD-10-CM | POA: Diagnosis not present

## 2019-09-02 DIAGNOSIS — F331 Major depressive disorder, recurrent, moderate: Secondary | ICD-10-CM

## 2019-09-02 MED ORDER — VORTIOXETINE HBR 20 MG PO TABS: ORAL_TABLET | ORAL | 2 refills | Status: DC

## 2019-09-02 MED ORDER — ZOLPIDEM TARTRATE 10 MG PO TABS: 10.0000 mg | ORAL_TABLET | Freq: Every evening | ORAL | 1 refills | Status: DC | PRN

## 2019-09-02 NOTE — Progress Notes (Signed)
Virtual Visit via Telephone Note  I connected with Angela Knox on 09/02/19 at  2:00 PM EDT by telephone and verified that I am speaking with the correct person using two identifiers.   I discussed the limitations, risks, security and privacy concerns of performing an evaluation and management service by telephone and the availability of in person appointments. I also discussed with the patient that there may be a patient responsible charge related to this service. The patient expressed understanding and agreed to proceed.   History of Present Illness: Patient is evaluated by phone session.  She is taking Trintellix 10 mg in the morning.  We tried trazodone but she did not feel it helped.  She is taking Ambien which is helping her sleep.  She admitted anxiety and nervousness.  We have discussed in the past to increase the Trintellix to help with anxiety but at that time she was not agreed but now she feels that she may need to go off the medication.  She did not specify triggers for anxiety but admitted multiple things going on in her life.  Her 65 year old daughter may need tube placement because getting multiple ear infection.  She is working as a Land at [employer].  She had a support from her husband.  She denies any mania, hallucinations or any suicidal thoughts.  She denies any more episodes of tachycardia.  She is seeing Isla Pence for therapy.  Her appetite is okay.  Her energy level is okay.   Past Psychiatric History: H/O depression and anxiety since 2012.  Took Wellbutrin than Prozac. After side effects from Prozac switched to Lexapro. No h/o inpatient, suicidal attempt, psychosis or abuse. Never seen psychiatrist before.   Psychiatric Specialty Exam: Physical Exam  Review of Systems  not currently breastfeeding.There is no height or weight on file to calculate BMI.  General Appearance: NA  Eye Contact:  NA  Speech:  Clear and Coherent  Volume:  Normal   Mood:  Anxious  Affect:  NA  Thought Process:  Goal Directed  Orientation:  NA  Thought Content:  Logical  Suicidal Thoughts:  No  Homicidal Thoughts:  No  Memory:  Immediate;   Good Recent;   Good Remote;   Good  Judgement:  Good  Insight:  Present  Psychomotor Activity:  NA  Concentration:  Concentration: Fair and Attention Span: Fair  Recall:  Good  Fund of Knowledge:  Good  Language:  Good  Akathisia:  No  Handed:  Right  AIMS (if indicated):     Assets:  Communication Skills Desire for Improvement Housing Resilience Social Support Talents/Skills Transportation  ADL's:  Intact  Cognition:  WNL  Sleep:   ok      Assessment and Plan: Major depressive disorder, recurrent.  Primary insomnia.  Patient is no longer taking trazodone as it did not help.  Continue Ambien 10 mg as needed for insomnia.  Discussed hypnotic abuse, dependence and withdrawal symptoms.  We will try Trintellix 20 mg to help her residual anxiety and depression.  Continue therapy with Isla Pence.  Recommended to call us back if she has any questions or any concerns.  Follow-up in 3 months.  Follow Up Instructions:    I discussed the assessment and treatment plan with the patient. The patient was provided an opportunity to ask questions and all were answered. The patient agreed with the plan and demonstrated an understanding of the instructions.   The patient was advised to call back or  seek an in-person evaluation if the symptoms worsen or if the condition fails to improve as anticipated.  I provided 20 minutes of non-face-to-face time during this encounter.   Kathlee Nations, MD

## 2019-09-09 ENCOUNTER — Ambulatory Visit (INDEPENDENT_AMBULATORY_CARE_PROVIDER_SITE_OTHER): Payer: BC Managed Care – PPO | Admitting: Licensed Clinical Social Worker

## 2019-09-09 ENCOUNTER — Other Ambulatory Visit: Payer: Self-pay

## 2019-09-09 DIAGNOSIS — F331 Major depressive disorder, recurrent, moderate: Secondary | ICD-10-CM | POA: Diagnosis not present

## 2019-09-09 DIAGNOSIS — F411 Generalized anxiety disorder: Secondary | ICD-10-CM

## 2019-09-09 NOTE — Progress Notes (Signed)
Virtual Visit via Video Note  I connected with Angela Knox on 09/09/19 at  2:00 PM EDT by a video enabled telemedicine application and verified that I am speaking with the correct person using two identifiers.   The patient was advised to call back or seek an in-person evaluation if the symptoms worsen or if the condition fails to improve as anticipated.  I provided 45 minutes of non-face-to-face time during this encounter.   Renee Harder, LCSW    THERAPIST PROGRESS NOTE  Session Time: 2:00pm-2:45pm  Participation Level: Active  Behavioral Response: NeatAlertAnxious  Type of Therapy: Individual Therapy  Treatment Goals addressed: Coping  Interventions: CBT, Motivational Interviewing and Supportive  Summary: Angela Knox is a 39 y.o. female who presents reporting anxiety sx and decreased motivation due to feeling overwhelmed. Client identified this time of year as a stressor regarding work duties as well as her family recovering from a stomach bug within the last few weeks. Client relayed that she has been able to spend time alone with her husband in recent weeks which has been helpful. Client initiated discussion on means in which she can appropriately communicate boundaries in what she is and isn't comfortable discussing with family members. Client was receptive to feedback and relayed that she is hopeful to engage in self-care following the completion of this week due to multiple work responsibilities within the coming days. Client denied SI/HI/psychosis.  Suicidal/Homicidal: Nowithout intent/plan  Therapist Response: Clinician met with client and facilitated check in assessing recent stressors, changes in mood, and SI/HI/psychosis. Clinician inquired on identified anxiety and utilized MI, SOARS in order to assess client's engagement in self-care or utilization of healthy coping skills. Clinician validated feelings and engaged in discussion on identified topic regarding  client's difficulty to set boundaries within specific relationships. Clinician explored hx of client's attempts to set boundaries with this person and provided examples and coaching in utilizing 'I Statements'. Clinician praised client's willingness to implement changes that might be out of her comfort zone. Will follow up on 10/14/19.  Plan: Return again in 4-5 weeks.  Diagnosis: Axis I: MDD, GAD    Renee Harder, LCSW 09/09/2019

## 2019-09-11 ENCOUNTER — Ambulatory Visit: Payer: BC Managed Care – PPO | Admitting: Rheumatology

## 2019-09-17 DIAGNOSIS — Z01818 Encounter for other preprocedural examination: Secondary | ICD-10-CM | POA: Diagnosis not present

## 2019-09-17 DIAGNOSIS — R0600 Dyspnea, unspecified: Secondary | ICD-10-CM | POA: Diagnosis not present

## 2019-09-21 ENCOUNTER — Other Ambulatory Visit (HOSPITAL_COMMUNITY): Payer: Self-pay | Admitting: Psychiatry

## 2019-09-21 DIAGNOSIS — F331 Major depressive disorder, recurrent, moderate: Secondary | ICD-10-CM

## 2019-09-23 ENCOUNTER — Telehealth (HOSPITAL_COMMUNITY): Payer: Self-pay | Admitting: Licensed Clinical Social Worker

## 2019-09-23 NOTE — Telephone Encounter (Signed)
Spoke w/ pt to inform her this clinician will be leaving the clinic as of 6/18 and initiated discussion on transition plan. Pt reported she will consider options and relay decision during final session on 10/10/19.

## 2019-09-24 ENCOUNTER — Other Ambulatory Visit: Payer: Self-pay

## 2019-09-24 ENCOUNTER — Telehealth: Payer: BC Managed Care – PPO | Admitting: Medical

## 2019-09-24 DIAGNOSIS — J329 Chronic sinusitis, unspecified: Secondary | ICD-10-CM

## 2019-09-24 DIAGNOSIS — B9789 Other viral agents as the cause of diseases classified elsewhere: Secondary | ICD-10-CM

## 2019-09-24 MED ORDER — PREDNISONE 10 MG (21) PO TBPK: ORAL_TABLET | ORAL | 0 refills | Status: DC

## 2019-09-24 NOTE — Progress Notes (Signed)
   Subjective:    Patient ID: Angela Knox, female    DOB: Jul 23, 1980, 39 y.o.   MRN: [id number]  HPI  39 yo female in non acute distress.Permission to do telemedicine appointment with patient.  Thinks she has a sinusitis with forehead pain and cheek pressure. Denies fever or chills. Has congestion and clear runny nose. Forehead headache and pressure in cheeks.Tried Mucinex over the weekend but it hardly gave her any relief. Tested last Wednesday for Covid-19, results were negative. Allergies  Allergen Reactions  . Amoxicillin     Nausea and vomiting on 03/15/18  . Ciprofloxacin Other (See Comments)    Body aches  . Asa [Aspirin] Itching and Rash     Review of Systems  Constitutional: Negative for chills and fever.  HENT: Positive for congestion, postnasal drip, rhinorrhea, sinus pressure, sinus pain and sneezing. Negative for ear pain.   Eyes: Negative for visual disturbance.  Respiratory: Positive for cough. Negative for shortness of breath and wheezing.   Cardiovascular: Negative for chest pain.  Gastrointestinal: Negative for diarrhea, nausea and vomiting.  Neurological: Positive for headaches. Negative for dizziness, tremors, seizures, syncope and light-headedness.   Last Sinusitis infection  Jan 2021.    Objective:   Physical Exam  No physical done telemedicine appointment. However patient did sound congested, no cough or sneezing during call. AXOX3 , no labored breathing.      Assessment & Plan:  Viral sinusitis Meds ordered this encounter  Medications  . predniSONE (STERAPRED UNI-PAK 21 TAB) 10 MG (21) TBPK tablet    Sig: Take 6 tablets by mouth today then 5 tablets tomorrow then one less every day there after. Take with food.    Dispense:  21 tablet    Refill:  0  Recommended OTC Afrin x 4 days only, OTC Zyrtec, OTC Flonase and either OTC Motrin or Tylenol all per package instructions. Rest and increase fluids.  She is to contact this office if she is not  improving in 3-5 days, or if worsening or if symptoms last longer then  2 weeks. To call if discharge from nose turns green. Patient verbalizes understanding and has no questions at the end of our conversation.

## 2019-09-25 ENCOUNTER — Encounter: Payer: Self-pay | Admitting: Medical

## 2019-09-26 ENCOUNTER — Encounter: Payer: Self-pay | Admitting: Nurse Practitioner

## 2019-09-26 ENCOUNTER — Telehealth: Payer: BC Managed Care – PPO | Admitting: Nurse Practitioner

## 2019-09-26 ENCOUNTER — Other Ambulatory Visit: Payer: Self-pay

## 2019-09-26 DIAGNOSIS — J320 Chronic maxillary sinusitis: Secondary | ICD-10-CM

## 2019-09-26 DIAGNOSIS — J3089 Other allergic rhinitis: Secondary | ICD-10-CM

## 2019-09-26 DIAGNOSIS — J0101 Acute recurrent maxillary sinusitis: Secondary | ICD-10-CM

## 2019-09-26 MED ORDER — DOXYCYCLINE HYCLATE 100 MG PO TABS: 100.0000 mg | ORAL_TABLET | Freq: Two times a day (BID) | ORAL | 0 refills | Status: DC

## 2019-09-26 NOTE — Progress Notes (Signed)
° °  Subjective:    Patient ID: Angela Knox, female    DOB: 1980-07-25, 39 y.o.   MRN: [id number]  HPI  Angela Knox is on a mychart virtual visit this am with verbal consent to treat. She had a telemedicine visit 2 days ago with another provider in office which she was put on Prednisone, Flonase and Zyrtec. Her initial sinus symptoms started over a week ago. She now reports a fever of 100.5 that started Wed. Night which she no longer has and was out of work yesterday due to current illness. She feels her sinus symptoms have worsened with green nasal discharge, facial pain to her forehead/maxillary area and now right ear pain. She denies SOB, c/p, or wheezing. Per chart review of last OV note her covid test last week negative.   Review of Systems  Constitutional: Positive for fever.  HENT: Positive for congestion, ear pain, sinus pressure and sinus pain.   Respiratory: Positive for cough. Negative for wheezing.   Cardiovascular: Negative for chest pain.       Objective:   Physical Exam Constitutional:      General: She is not in acute distress.    Appearance: Normal appearance. She is not ill-appearing, toxic-appearing or diaphoretic.     Comments: Appears tired  HENT:     Head: Normocephalic and atraumatic.     Nose:     Comments: Audible nasal congestion Skin:    Coloration: Skin is not pale.     Findings: No bruising, erythema, lesion or rash.  Neurological:     Mental Status: She is alert and oriented to person, place, and time.  Psychiatric:        Mood and Affect: Mood normal.           Assessment & Plan:  Discussed need for ENT d/t recurrent sinus infections for eval/tx and agrees. Will add abx for worsening sinus which is now likely bacterial sinusitis. Follow tx in plan and f/u as needed.

## 2019-09-26 NOTE — Telephone Encounter (Signed)
Angela Knox will see me today so you can disregard this message.

## 2019-09-26 NOTE — Patient Instructions (Addendum)
ENT referral to Dr.  Jenne Campus for evaluation of recurrent sinusitis/allergy symptoms Continue current treatment (prednison, Flonase, Zyrtec) and start antibiotics as directed Remember additional contraception with use of antibiotics Fluids and rest encouraged Encouraged patient to call the office or primary care doctor for an appointment if no improvement in symptoms or if symptoms change or worsen after 72 hours of planned treatment. Patient verbalized understanding of all instructions given/reviewed and has no further questions or concerns at this time.

## 2019-09-29 NOTE — Telephone Encounter (Signed)
Thanik you Abram.

## 2019-10-10 ENCOUNTER — Other Ambulatory Visit: Payer: Self-pay

## 2019-10-10 ENCOUNTER — Ambulatory Visit (INDEPENDENT_AMBULATORY_CARE_PROVIDER_SITE_OTHER): Payer: BC Managed Care – PPO | Admitting: Licensed Clinical Social Worker

## 2019-10-10 DIAGNOSIS — F411 Generalized anxiety disorder: Secondary | ICD-10-CM

## 2019-10-10 DIAGNOSIS — F331 Major depressive disorder, recurrent, moderate: Secondary | ICD-10-CM

## 2019-10-10 NOTE — Progress Notes (Signed)
Virtual Visit via Video Note  I connected with Angela Knox on 10/10/19 at 10:00 AM EDT by a video enabled telemedicine application and verified that I am speaking with the correct person using two identifiers.   I discussed the limitations of evaluation and management by telemedicine and the availability of in person appointments. The patient expressed understanding and agreed to proceed.  The patient was advised to call back or seek an in-person evaluation if the symptoms worsen or if the condition fails to improve as anticipated.  Location: Patient: Pt office/work Therapist: Programmer, systems  I provided 25 minutes of non-face-to-face time during this encounter.   Francine Graven, LCSW    THERAPIST PROGRESS NOTE  Session Time: 10am-10:25am  Participation Level: Active  Behavioral Response: NeatAlertEuthymic  Type of Therapy: Individual Therapy  Treatment Goals addressed: Coping  Interventions: Motivational Interviewing and Supportive  Summary: Clt presented alert and oriented for scheduled therapy session via tele-health platform. Clt engaged in check in providing an update on overall functioning since last session. 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 Clt reflected on progress made throughout therapy thus far and agreed to continue services with clinician B. Morris moving forward. Clt identified an area of improvement as "learning more techniques for stress management". Clt denied SI/HI/psychosis during this engagement.   Suicidal/Homicidal: Nowithout intent/plan  Therapist Response: Clinician joined clt for scheduled session and facilitated check in assessing recent stressors, changes in mood, and SI/HI/psychosis. Clinician engaged in discussion on identified events and utilized open ended questions and summarizations  throughout session. Clinician provided praise for clt's willingness to implement techniques learned in therapy and prompted discussion on a transition plan for clt to continue therapy moving forward. Clinician explained the transition process and agreed to have front desk staff contact clt to schedule her next appt as well as provided date/time of her next appointment with Dr. Lolly Mustache.   Plan: Pt to transition to clinician B. Morris due to this clinician's resignation.  Diagnosis: Axis I: MDD, GAD        Francine Graven, LCSW 10/10/2019

## 2019-10-14 DIAGNOSIS — S82002A Unspecified fracture of left patella, initial encounter for closed fracture: Secondary | ICD-10-CM | POA: Diagnosis not present

## 2019-10-14 DIAGNOSIS — M25562 Pain in left knee: Secondary | ICD-10-CM | POA: Diagnosis not present

## 2019-10-21 ENCOUNTER — Encounter (HOSPITAL_COMMUNITY): Payer: Self-pay | Admitting: Psychiatry

## 2019-10-21 ENCOUNTER — Other Ambulatory Visit: Payer: Self-pay

## 2019-10-21 ENCOUNTER — Ambulatory Visit (INDEPENDENT_AMBULATORY_CARE_PROVIDER_SITE_OTHER): Payer: BC Managed Care – PPO | Admitting: Psychiatry

## 2019-10-21 DIAGNOSIS — F411 Generalized anxiety disorder: Secondary | ICD-10-CM

## 2019-10-21 NOTE — Progress Notes (Signed)
Virtual Visit via Video Note  I connected with Angela Knox on 10/21/19 at  3:30 PM EDT by a video enabled telemedicine application and verified that I am speaking with the correct person using two identifiers.  Location: Patient: Patient Home Provider: Home Office   I discussed the limitations of evaluation and management by telemedicine and the availability of in person appointments. The patient expressed understanding and agreed to proceed.  History of Present Illness: GAD   Treatment Plan Goals: 1) Angela Knox would like to develop stress management skills for the workplace to decrease anxiety and its affects on work-life balance.  2) Angela Knox would like to reduce impulse to control others and situations, through learning self-regulation skills, communication skills, and the skill of radical acceptance.   Observations/Objective: Counselor met with Client for individual therapy via Webex. Counselor assessed MH symptoms and progress on treatment plan goals, with patient reporting improvement in presentation of depression and anxiety symptoms. Client presents with mild depression and moderate anxiety. Client denied suicidal ideation or self-harm behaviors.   Client shared that she has had a positive two weeks, since last session with previous counselor. Counselor assessed daily functioning, with client reporting ability to got to work, care for daughter, spend additional time with sister who recently had first child, and is expressing her needs with her husband. Counselor assessed family history and dynamics in relation to mental health status, with client reporting that she has a healthy support system, who are helpful in understanding, validating and meeting her needs as they arise. Counselor and Client reestablished and created treatment plan goals (see above) as we move forward with therapeutic relationship and treatment. Client noted that she engages in homework between sessions and would like to  meet more frequently once school year has began, as she anticipates more life stressors to focus on in treatment. Counselor to send homework and set upcoming appointments.   Assessment and Plan: Counselor will continue to meet with patient to address treatment plan goals. Patient will continue to follow recommendations of providers and implement skills learned in session.  Follow Up Instructions: Counselor will send information for next session via Webex.    The patient was advised to call back or seek an in-person evaluation if the symptoms worsen or if the condition fails to improve as anticipated.  I provided 35 minutes of non-face-to-face time during this encounter.   Lise Auer, LCSW

## 2019-10-28 ENCOUNTER — Ambulatory Visit (HOSPITAL_COMMUNITY): Payer: BC Managed Care – PPO | Admitting: Licensed Clinical Social Worker

## 2019-10-31 DIAGNOSIS — S82002A Unspecified fracture of left patella, initial encounter for closed fracture: Secondary | ICD-10-CM | POA: Diagnosis not present

## 2019-10-31 DIAGNOSIS — S82015A Nondisplaced osteochondral fracture of left patella, initial encounter for closed fracture: Secondary | ICD-10-CM | POA: Diagnosis not present

## 2019-11-11 DIAGNOSIS — Z03818 Encounter for observation for suspected exposure to other biological agents ruled out: Secondary | ICD-10-CM | POA: Diagnosis not present

## 2019-11-11 DIAGNOSIS — Z20822 Contact with and (suspected) exposure to covid-19: Secondary | ICD-10-CM | POA: Diagnosis not present

## 2019-11-17 ENCOUNTER — Ambulatory Visit (HOSPITAL_COMMUNITY): Payer: BC Managed Care – PPO | Admitting: Psychiatry

## 2019-11-17 ENCOUNTER — Other Ambulatory Visit: Payer: Self-pay

## 2019-11-17 DIAGNOSIS — S82002D Unspecified fracture of left patella, subsequent encounter for closed fracture with routine healing: Secondary | ICD-10-CM | POA: Diagnosis not present

## 2019-11-17 DIAGNOSIS — M25562 Pain in left knee: Secondary | ICD-10-CM | POA: Diagnosis not present

## 2019-11-27 ENCOUNTER — Ambulatory Visit (HOSPITAL_COMMUNITY): Payer: BC Managed Care – PPO | Admitting: Psychiatry

## 2019-12-01 DIAGNOSIS — S82015A Nondisplaced osteochondral fracture of left patella, initial encounter for closed fracture: Secondary | ICD-10-CM | POA: Diagnosis not present

## 2019-12-02 ENCOUNTER — Telehealth (INDEPENDENT_AMBULATORY_CARE_PROVIDER_SITE_OTHER): Payer: BC Managed Care – PPO | Admitting: Psychiatry

## 2019-12-02 ENCOUNTER — Other Ambulatory Visit: Payer: Self-pay

## 2019-12-02 ENCOUNTER — Encounter (HOSPITAL_COMMUNITY): Payer: Self-pay | Admitting: Psychiatry

## 2019-12-02 DIAGNOSIS — F331 Major depressive disorder, recurrent, moderate: Secondary | ICD-10-CM | POA: Diagnosis not present

## 2019-12-02 DIAGNOSIS — F5101 Primary insomnia: Secondary | ICD-10-CM | POA: Diagnosis not present

## 2019-12-02 MED ORDER — ZOLPIDEM TARTRATE 10 MG PO TABS: 10.0000 mg | ORAL_TABLET | Freq: Every evening | ORAL | 1 refills | Status: DC | PRN

## 2019-12-02 MED ORDER — VORTIOXETINE HBR 20 MG PO TABS: ORAL_TABLET | ORAL | 2 refills | Status: DC

## 2019-12-02 NOTE — Progress Notes (Signed)
Virtual Visit via Telephone Note  I connected with Angela Knox on 12/02/19 at  2:00 PM EDT by telephone and verified that I am speaking with the correct person using two identifiers.  Location: Patient: home Provider: home office   I discussed the limitations, risks, security and privacy concerns of performing an evaluation and management service by telephone and the availability of in person appointments. I also discussed with the patient that there may be a patient responsible charge related to this service. The patient expressed understanding and agreed to proceed.   History of Present Illness: Patient is evaluated by phone session.  She is now taking Trintellix 20 mg in the morning.  She is feeling better.  She is taking Ambien as needed for insomnia which works well.  Patient is working as a Land at [employer].  She is working in the summer but also she had a time to visit beach few times with the family and she had a good time.  She denies any crying spells or any feeling of hopelessness or worthlessness.  Her 35-year-old daughter continue to get sick and she had tube placement in summer because of multiple ear infection.  She lives with her husband and she had a lot of support from her husband.  Patient denies any paranoia or any hallucination.  She is tolerating her medication reported no side effects.  Since Angela Knox her therapist left she is now in therapy with Angela Knox and things are going well.  She like to keep her current medication.   Past Psychiatric History: H/Odepression and anxiety since 2012.TookWellbutrinthan Prozac. Afterside effectsfrom Prozac switched toLexapro. No h/oinpatient, suicidal attempt, psychosis or abuse. Never seen psychiatrist before.   Psychiatric Specialty Exam: Physical Exam  Review of Systems  Weight 217 lb (98.4 kg), not currently breastfeeding.There is no height or weight on file to calculate BMI.  General Appearance: NA   Eye Contact:  NA  Speech:  Clear and Coherent  Volume:  Normal  Mood:  Euthymic  Affect:  NA  Thought Process:  Goal Directed  Orientation:  Full (Time, Place, and Person)  Thought Content:  WDL  Suicidal Thoughts:  No  Homicidal Thoughts:  No  Memory:  Immediate;   Good Recent;   Good Remote;   Good  Judgement:  Good  Insight:  Good  Psychomotor Activity:  NA  Concentration:  Concentration: Good and Attention Span: Good  Recall:  Good  Fund of Knowledge:  Good  Language:  Good  Akathisia:  No  Handed:  Right  AIMS (if indicated):     Assets:  Communication Skills Desire for Improvement Housing Resilience Social Support Talents/Skills Transportation  ADL's:  Intact  Cognition:  WNL  Sleep:   ok     Assessment and Plan: Major depressive disorder, recurrent.  Primary insomnia.  Patient is doing better and is stable on her current medication.  Continue Trintellix 20 mg daily and Ambien 10 mg as needed for insomnia.  Discussed medication side effects and benefits.  Encouraged to continue therapy with [facility].  Recommended to call us back if she is any question or any concern.  Follow-up in 3 months.  Follow Up Instructions:    I discussed the assessment and treatment plan with the patient. The patient was provided an opportunity to ask questions and all were answered. The patient agreed with the plan and demonstrated an understanding of the instructions.   The patient was advised to call back or seek an in-person  evaluation if the symptoms worsen or if the condition fails to improve as anticipated.  I provided 20 minutes of non-face-to-face time during this encounter.   Cleotis Nipper, MD

## 2019-12-18 ENCOUNTER — Ambulatory Visit (HOSPITAL_COMMUNITY): Payer: BC Managed Care – PPO | Admitting: Psychiatry

## 2019-12-27 ENCOUNTER — Telehealth: Payer: BC Managed Care – PPO | Admitting: Family

## 2019-12-27 DIAGNOSIS — H109 Unspecified conjunctivitis: Secondary | ICD-10-CM

## 2019-12-27 MED ORDER — POLYMYXIN B-TRIMETHOPRIM 10000-0.1 UNIT/ML-% OP SOLN
1.0000 [drp] | OPHTHALMIC | 0 refills | Status: DC
Start: 2019-12-27 — End: 2020-02-11

## 2019-12-27 NOTE — Progress Notes (Signed)
E-Visit for Pink Eye   We are sorry that you are not feeling well.  Here is how we plan to help!  Based on what you have shared with me it looks like you have conjunctivitis.  Conjunctivitis is a common inflammatory or infectious condition of the eye that is often referred to as "pink eye".  In most cases it is contagious (viral or bacterial). However, not all conjunctivitis requires antibiotics (ex. Allergic).  We have made appropriate suggestions for you based upon your presentation.  I have prescribed Polytrim Ophthalmic drops 1-2 drops 4 times a day times 5 days  Pink eye can be highly contagious.  It is typically spread through direct contact with secretions, or contaminated objects or surfaces that one may have touched.  Strict handwashing is suggested with soap and water is urged.  If not available, use alcohol based had sanitizer.  Avoid unnecessary touching of the eye.  If you wear contact lenses, you will need to refrain from wearing them until you see no white discharge from the eye for at least 24 hours after being on medication.  You should see symptom improvement in 1-2 days after starting the medication regimen.  Call us if symptoms are not improved in 1-2 days.  Home Care:  Wash your hands often!  Do not wear your contacts until you complete your treatment plan.  Avoid sharing towels, bed linen, personal items with a person who has pink eye.  See attention for anyone in your home with similar symptoms.  Get Help Right Away If:  Your symptoms do not improve.  You develop blurred or loss of vision.  Your symptoms worsen (increased discharge, pain or redness)  Your e-visit answers were reviewed by a board certified advanced clinical practitioner to complete your personal care plan.  Depending on the condition, your plan could have included both over the counter or prescription medications.  If there is a problem please reply  once you have received a response from your  provider.  Your safety is important to us.  If you have drug allergies check your prescription carefully.    You can use MyChart to ask questions about today's visit, request a non-urgent call back, or ask for a work or school excuse for 24 hours related to this e-Visit. If it has been greater than 24 hours you will need to follow up with your provider, or enter a new e-Visit to address those concerns.   You will get an e-mail in the next two days asking about your experience.  I hope that your e-visit has been valuable and will speed your recovery. Thank you for using e-visits.    Greater than 5 minutes, yet less than 10 minutes of time have been spent researching, coordinating, and implementing care for this patient today.  Thank you for the details you included in the comment boxes. Those details are very helpful in determining the best course of treatment for you and help us to provide the best care.  

## 2020-01-05 ENCOUNTER — Other Ambulatory Visit: Payer: Self-pay | Admitting: Critical Care Medicine

## 2020-01-05 ENCOUNTER — Other Ambulatory Visit: Payer: BC Managed Care – PPO

## 2020-01-05 DIAGNOSIS — Z20822 Contact with and (suspected) exposure to covid-19: Secondary | ICD-10-CM

## 2020-01-06 LAB — SARS-COV-2, NAA 2 DAY TAT

## 2020-01-06 LAB — NOVEL CORONAVIRUS, NAA: SARS-CoV-2, NAA: NOT DETECTED

## 2020-02-11 ENCOUNTER — Other Ambulatory Visit: Payer: Self-pay

## 2020-02-11 ENCOUNTER — Encounter: Payer: Self-pay | Admitting: Family Medicine

## 2020-02-11 ENCOUNTER — Ambulatory Visit (INDEPENDENT_AMBULATORY_CARE_PROVIDER_SITE_OTHER): Payer: BC Managed Care – PPO | Admitting: Family Medicine

## 2020-02-11 VITALS — BP 130/69 | HR 98 | Temp 97.8°F | Resp 16 | Ht 65.0 in | Wt 218.0 lb

## 2020-02-11 DIAGNOSIS — F3341 Major depressive disorder, recurrent, in partial remission: Secondary | ICD-10-CM

## 2020-02-11 DIAGNOSIS — E669 Obesity, unspecified: Secondary | ICD-10-CM | POA: Insufficient documentation

## 2020-02-11 DIAGNOSIS — G43709 Chronic migraine without aura, not intractable, without status migrainosus: Secondary | ICD-10-CM

## 2020-02-11 MED ORDER — SUMATRIPTAN SUCCINATE 50 MG PO TABS: 50.0000 mg | ORAL_TABLET | Freq: Once | ORAL | 1 refills | Status: DC | PRN

## 2020-02-11 NOTE — Patient Instructions (Addendum)
Thank you for coming to the office today.  1. You most likely have Chronic Migraine Headaches - Migraine headaches present differently for many patients, pain is usually throbbing or aching, often on one side of head or behind the eye. They tend to last for up to hours or days. In treating migraines, our goal is to 1) stop the headache and 2) prevent recurrence of headaches  Treatment to STOP the headache at this time: - Start with Sumatriptan 50mg  - take 1 immediately at onset of moderate to severe migraine headache, if unresolved or return within 2 hours then repeat dose 1 tablet, that is max dose for 24 hours. (Note - this medication can cause a brief episode of flushing and chest pressure or pain very soon after taking it. That is NORMAL, and it is the medicine taking effect and dilating some blood vessels. It should pass, and resolve within seconds to minutes after - it may not happen at all)  - Try this for 1-2 weeks, if absolutely NOT helping then contact me to discuss changes, possibly can double sumatriptan dose or try nasal spray - You can still take over the counter meds with Ibuprofen up to 600-800mg  per dose 3 times a day with food for a few days and may try Excedrin Migraine as needed, ONLY use these after you have tried Sumatriptan up to 2 doses, and try to limit their use if they are not effective  Treatment to PREVENT headaches: - Goal is to avoid triggers. We need to learn more details on what are your exact or possible headache triggers first. - Keep detailed headache diary (on printed handout) for possible triggers, bring this to your next visit to discuss further - Known possible triggers include caffeine, chocolate, alcohol, stress, weather changes, menstrual cycle, certain other foods - Also be aware that OTC pain meds/anti-inflammatories can cause rebound headache, they help resolve the headache but then after the effect wears off they can CAUSE a headache. Try to taper down  and stop these medications and allow them to get out of your system for 1-2 weeks   Nurtec ODT - look into this option.   Please schedule a Follow-up Appointment to: Return in about 3 months (around 05/13/2020), or if symptoms worsen or fail to improve, for 3 month migraine as needed if not improved.  If you have any other questions or concerns, please feel free to call the office or send a message through MyChart. You may also schedule an earlier appointment if necessary.  Additionally, you may be receiving a survey about your experience at our office within a few days to 1 week by e-mail or mail. We value your feedback.  05/15/2020, DO [facility], [facility]

## 2020-02-11 NOTE — Assessment & Plan Note (Addendum)
Improved mood in partial remission Chronic major depression recurrent See PHQ Limited anxiety. Has secondary complication w chronic insomnia mostly sleep onset, now some sleep latency issue. Long term use of Ambien 10mg  nightly for several years Prior meds Lexapro, Prozac, Wellbutrin  Plan Followed [facility], on Trintellix Consider reviewing options for insomnia in future - such as trazodone, mirtazapine, or consider other hypnotic agent such as Dayvigo for daily use

## 2020-02-11 NOTE — Progress Notes (Signed)
Subjective:    Patient ID: Angela Knox, female    DOB: 1980/05/19, 39 y.o.   MRN: [id number]  Angela Knox is a 39 y.o. female presenting on 02/11/2020 for Migraine   HPI   MIGRAINE HEADACHE: New problem with behind eye headache, can be R or L, onset usually day 2 of menstrual cycle, has typical 28 day cycle while on Loestrin med currently followed by [name] - first started about 5-6 months ago. Quality: throbbing, pulsing. With increasing intensity and has some movement or radiation across head Duration of headache without treatment: 1 day, resolved or improves with sleep. Current Frequency: x 1 monthly, usually predictable as above. No other headaches or migraines during non menstrual cycle. Longest duration without headache: nearly 1 month Accompanying symptoms: nausea, no vomiting, photophobia, phonophobia, nolacrimation, no rhinorrhea, no neurologic symptoms, numbness or tingling Effective treatment: Ibuprofen 200mg  x 4 = 800mg  every 6 hour PRN for 1 day (mild relief) Ineffective treatment: Tylenol Ext 500mg  no relief History of headaches: no significant history. Migraines or other. No fam history of migraines History of imaging: None Known triggers: menstrual cycle  Major Depression, recurrent in partial remission Followed by psychiatry Currently doing well on medication  PMH Obesity BMI >36   Depression screen [facility] 2/9 02/11/2020 07/21/2019 08/29/2018  Decreased Interest 0 1 3  Down, Depressed, Hopeless 0 1 3  PHQ - 2 Score 0 2 6  Altered sleeping 1 3 3   Tired, decreased energy 0 1 3  Change in appetite 0 1 3  Feeling bad or failure about yourself  0 0 0  Trouble concentrating 0 1 1  Moving slowly or fidgety/restless 0 0 0  Suicidal thoughts 0 0 0  PHQ-9 Score 1 8 16   Difficult doing work/chores - Not difficult at all Somewhat difficult    Social History   Tobacco Use  . Smoking status: Never Smoker  . Smokeless tobacco: Never Used    Vaping Use  . Vaping Use: Never used  Substance Use Topics  . Alcohol use: Yes    Alcohol/week: 1.0 standard drink    Types: 1 Standard drinks or equivalent per week    Comment: occasional  . Drug use: No    Review of Systems Per HPI unless specifically indicated above     Objective:    BP 130/69   Pulse 98   Temp 97.8 F (36.6 C) (Temporal)   Resp 16   Ht 5\' 5"  (1.651 m)   Wt 218 lb (98.9 kg)   SpO2 99%   BMI 36.28 kg/m   Wt Readings from Last 3 Encounters:  02/11/20 218 lb (98.9 kg)  07/21/19 217 lb (98.4 kg)  06/20/19 222 lb (100.7 kg)    Physical Exam Vitals and nursing note reviewed.  Constitutional:      General: She is not in acute distress.    Appearance: She is well-developed. She is not diaphoretic.     Comments: Well-appearing, comfortable, cooperative  HENT:     Head: Normocephalic and atraumatic.  Eyes:     General:        Right eye: No discharge.        Left eye: No discharge.     Conjunctiva/sclera: Conjunctivae normal.  Cardiovascular:     Rate and Rhythm: Normal rate.  Pulmonary:     Effort: Pulmonary effort is normal.  Skin:    General: Skin is warm and dry.     Findings: No erythema or rash.  Neurological:     Mental Status: She is alert and oriented to person, place, and time.  Psychiatric:        Behavior: Behavior normal.     Comments: Well groomed, good eye contact, normal speech and thoughts    Results for orders placed or performed in visit on 01/05/20  Novel Coronavirus, NAA ([facility])   Specimen: Nasopharyngeal(NP) swabs in vial transport medium   Nasopharynge  Screenin  Result Value Ref Range   SARS-CoV-2, NAA Not Detected Not Detected  SARS-COV-2, NAA 2 DAY TAT   Nasopharynge  Screenin  Result Value Ref Range   SARS-CoV-2, NAA 2 DAY TAT Performed       Assessment & Plan:   Problem List Items Addressed This Visit    Obesity (BMI 35.0-39.9 without comorbidity)   Major depressive disorder, recurrent, in partial  remission (HCC)    Improved mood in partial remission Chronic major depression recurrent See PHQ Limited anxiety. Has secondary complication w chronic insomnia mostly sleep onset, now some sleep latency issue. Long term use of Ambien 10mg  nightly for several years Prior meds Lexapro, Prozac, Wellbutrin  Plan Followed [facility], on Trintellix Consider reviewing options for insomnia in future - such as trazodone, mirtazapine, or consider other hypnotic agent such as Dayvigo for daily use      Chronic migraine without aura without status migrainosus, not intractable - Primary   Relevant Medications   SUMAtriptan (IMITREX) 50 MG tablet      >6 month Consistent with predictable migraine HA monthly 2nd day of menstrual cycle Note on OCP for years, no changes No other triggers Typical migraine symptoms, no prior history Failed NSAID  Plan: 1. Start abortive therapy with Sumatriptan 50mg  tabs - take 1 PRN (#12, 0 refill due to quantity limit), severe HA, may repeat dose within 2 hr if persistent, no more in 24 hours, in future can titrate dose to 50-100mg  if needed. Counseling on potential side effect / intolerance with chest discomfort acutely after taking sumatriptan 2. Recommend taking Ibuprofen 600-800mg  q 8 hr PRN, and can try Tylenol 1000mg  TID alternatively 3. Avoid triggers including foods, caffeine. Important to rest. Future consider Nurtec ODT as next abortive option for migraines  Return criteria given for acute migraine, when to go to office vs ED  Missing 1 day a month, may need some sort of FMLA or medical exception  Meds ordered this encounter  Medications  . SUMAtriptan (IMITREX) 50 MG tablet    Sig: Take 1 tablet (50 mg total) by mouth once as needed for up to 1 dose for migraine. May repeat one dose in 2 hours if headache persists, for max dose 24 hours    Dispense:  12 tablet    Refill:  1     Follow up plan: Return in about 3 months (around 05/13/2020), or if  symptoms worsen or fail to improve, for 3 month migraine as needed if not improved.   , DO [facility] 02/11/2020, 1:52 PM

## 2020-02-15 ENCOUNTER — Telehealth: Payer: BC Managed Care – PPO | Admitting: Family

## 2020-02-15 DIAGNOSIS — B373 Candidiasis of vulva and vagina: Secondary | ICD-10-CM | POA: Diagnosis not present

## 2020-02-15 DIAGNOSIS — B3731 Acute candidiasis of vulva and vagina: Secondary | ICD-10-CM

## 2020-02-15 MED ORDER — FLUCONAZOLE 150 MG PO TABS
150.0000 mg | ORAL_TABLET | ORAL | 0 refills | Status: DC | PRN
Start: 2020-02-15 — End: 2020-04-30

## 2020-02-15 NOTE — Progress Notes (Signed)

## 2020-02-17 ENCOUNTER — Telehealth: Payer: BC Managed Care – PPO | Admitting: Physician Assistant

## 2020-02-17 DIAGNOSIS — N390 Urinary tract infection, site not specified: Secondary | ICD-10-CM

## 2020-02-17 MED ORDER — NITROFURANTOIN MONOHYD MACRO 100 MG PO CAPS: 100.0000 mg | ORAL_CAPSULE | Freq: Two times a day (BID) | ORAL | 0 refills | Status: DC

## 2020-02-17 NOTE — Progress Notes (Signed)
We are sorry that you are not feeling well.  Here is how we plan to help!  Based on what you shared with me it looks like you most likely have a simple urinary tract infection.  A UTI (Urinary Tract Infection) is a bacterial infection of the bladder.  Most cases of urinary tract infections are simple to treat but a key part of your care is to encourage you to drink plenty of fluids and watch your symptoms carefully.  I have prescribed MacroBid 100 mg twice a day for 5 days.  Your symptoms should gradually improve. Call us if the burning in your urine worsens, you develop worsening fever, back pain or pelvic pain or if your symptoms do not resolve after completing the antibiotic.  Urinary tract infections can be prevented by drinking plenty of water to keep your body hydrated.  Also be sure when you wipe, wipe from front to back and don't hold it in!  If possible, empty your bladder every 4 hours.  Your e-visit answers were reviewed by a board certified advanced clinical practitioner to complete your personal care plan.  Depending on the condition, your plan could have included both over the counter or prescription medications.  If there is a problem please reply  once you have received a response from your provider.  Your safety is important to us.  If you have drug allergies check your prescription carefully.    You can use MyChart to ask questions about today's visit, request a non-urgent call back, or ask for a work or school excuse for 24 hours related to this e-Visit. If it has been greater than 24 hours you will need to follow up with your provider, or enter a new e-Visit to address those concerns.   You will get an e-mail in the next two days asking about your experience.  I hope that your e-visit has been valuable and will speed your recovery. Thank you for using e-visits.   Greater than 5 minutes, yet less than 10 minutes of time have been spent researching, coordinating, and  implementing care for this patient today  

## 2020-02-23 ENCOUNTER — Other Ambulatory Visit (HOSPITAL_COMMUNITY): Payer: Self-pay | Admitting: Psychiatry

## 2020-02-23 ENCOUNTER — Other Ambulatory Visit: Payer: Self-pay | Admitting: Certified Nurse Midwife

## 2020-02-23 DIAGNOSIS — F5101 Primary insomnia: Secondary | ICD-10-CM

## 2020-02-24 ENCOUNTER — Telehealth (HOSPITAL_COMMUNITY): Payer: Self-pay | Admitting: *Deleted

## 2020-02-24 DIAGNOSIS — F5101 Primary insomnia: Secondary | ICD-10-CM

## 2020-02-24 MED ORDER — ZOLPIDEM TARTRATE 10 MG PO TABS: 10.0000 mg | ORAL_TABLET | Freq: Every evening | ORAL | 0 refills | Status: DC | PRN

## 2020-02-24 NOTE — Telephone Encounter (Signed)
Pt called requesting refill of the Ambien 10mg . Pt has an upcoming appointment on 03/03/20. Ok to fill?

## 2020-02-24 NOTE — Telephone Encounter (Signed)
Done

## 2020-03-01 ENCOUNTER — Other Ambulatory Visit (HOSPITAL_COMMUNITY): Payer: Self-pay | Admitting: Psychiatry

## 2020-03-01 DIAGNOSIS — F331 Major depressive disorder, recurrent, moderate: Secondary | ICD-10-CM

## 2020-03-03 ENCOUNTER — Encounter (HOSPITAL_COMMUNITY): Payer: Self-pay | Admitting: Psychiatry

## 2020-03-03 ENCOUNTER — Telehealth (INDEPENDENT_AMBULATORY_CARE_PROVIDER_SITE_OTHER): Payer: BC Managed Care – PPO | Admitting: Psychiatry

## 2020-03-03 ENCOUNTER — Other Ambulatory Visit: Payer: Self-pay

## 2020-03-03 VITALS — Wt 215.0 lb

## 2020-03-03 DIAGNOSIS — F419 Anxiety disorder, unspecified: Secondary | ICD-10-CM | POA: Diagnosis not present

## 2020-03-03 DIAGNOSIS — F331 Major depressive disorder, recurrent, moderate: Secondary | ICD-10-CM

## 2020-03-03 DIAGNOSIS — F5101 Primary insomnia: Secondary | ICD-10-CM

## 2020-03-03 MED ORDER — VORTIOXETINE HBR 20 MG PO TABS: ORAL_TABLET | ORAL | 2 refills | Status: DC

## 2020-03-03 NOTE — Progress Notes (Signed)
Virtual Visit via Telephone Note  I connected with Angela Knox on 03/03/20 at  2:20 PM EST by telephone and verified that I am speaking with the correct person using two identifiers.  Location: Patient: Work Provider: Economist   I discussed the limitations, risks, security and privacy concerns of performing an evaluation and management service by telephone and the availability of in person appointments. I also discussed with the patient that there may be a patient responsible charge related to this service. The patient expressed understanding and agreed to proceed.   History of Present Illness: Patient is evaluated by phone session.  She is taking Trintellix and she feels it is working good for anxiety and depression.  She occasionally take Ambien when she cannot sleep and that helps her a lot..  Due to her conflict of schedule she has unable to keep the therapy appointment.  She is working as a Emergency planning/management officer at [employer].  She feels that she does not need the therapy since things are going well.  She is sleeping good.  She denies any crying spells or any feeling of hopelessness or worthlessness.  She lives with her husband and 43-year-old daughter.  She had a good support from her husband.  Patient denies any paranoia or any suicidal thoughts.  She like to keep her current medication.  Her energy level is good.  Her appetite is okay.  Past Psychiatric History: H/Odepression and anxiety since 2012.TookWellbutrinthan Prozac. Afterside effectsfrom Prozac switched toLexapro. No h/oinpatient, suicidal attempt, psychosis or abuse. Never seen psychiatrist before.   Psychiatric Specialty Exam: Physical Exam  Review of Systems  Weight 215 lb (97.5 kg).There is no height or weight on file to calculate BMI.  General Appearance: NA  Eye Contact:  NA  Speech:  Clear and Coherent  Volume:  Normal  Mood:  Euthymic  Affect:  NA  Thought Process:  Goal Directed  Orientation:   Full (Time, Place, and Person)  Thought Content:  WDL  Suicidal Thoughts:  No  Homicidal Thoughts:  No  Memory:  Immediate;   Good Recent;   Good Remote;   Good  Judgement:  Good  Insight:  Good  Psychomotor Activity:  NA  Concentration:  Concentration: Good and Attention Span: Good  Recall:  Good  Fund of Knowledge:  Good  Language:  Good  Akathisia:  No  Handed:  Right  AIMS (if indicated):     Assets:  Communication Skills Desire for Improvement Financial Resources/Insurance Housing Physical Health Resilience Social Support Talents/Skills Transportation  ADL's:  Intact  Cognition:  WNL  Sleep:   ok      Assessment and Plan: Major depressive disorder, recurrent.  Anxiety.  Primary insomnia.  Patient is a stable on her current medication.  Continue Trintellix 20 mg daily and Ambien 10 mg as needed for insomnia.  Discussed medication side effects and benefits.  Recommended to call us back if she has any question or any concern.  Follow-up in 3 months.  Follow Up Instructions:    I discussed the assessment and treatment plan with the patient. The patient was provided an opportunity to ask questions and all were answered. The patient agreed with the plan and demonstrated an understanding of the instructions.   The patient was advised to call back or seek an in-person evaluation if the symptoms worsen or if the condition fails to improve as anticipated.  I provided 14 minutes of non-face-to-face time during this encounter.   Cleotis Nipper,  MD

## 2020-03-12 ENCOUNTER — Other Ambulatory Visit: Payer: Self-pay | Admitting: Certified Nurse Midwife

## 2020-03-23 ENCOUNTER — Other Ambulatory Visit (HOSPITAL_COMMUNITY): Payer: Self-pay | Admitting: Psychiatry

## 2020-03-23 DIAGNOSIS — F5101 Primary insomnia: Secondary | ICD-10-CM

## 2020-03-24 DIAGNOSIS — D2262 Melanocytic nevi of left upper limb, including shoulder: Secondary | ICD-10-CM | POA: Diagnosis not present

## 2020-03-24 DIAGNOSIS — D2261 Melanocytic nevi of right upper limb, including shoulder: Secondary | ICD-10-CM | POA: Diagnosis not present

## 2020-03-24 DIAGNOSIS — D2271 Melanocytic nevi of right lower limb, including hip: Secondary | ICD-10-CM | POA: Diagnosis not present

## 2020-03-24 DIAGNOSIS — D225 Melanocytic nevi of trunk: Secondary | ICD-10-CM | POA: Diagnosis not present

## 2020-03-29 ENCOUNTER — Telehealth: Payer: BC Managed Care – PPO | Admitting: Family

## 2020-03-29 ENCOUNTER — Telehealth (HOSPITAL_COMMUNITY): Payer: Self-pay | Admitting: *Deleted

## 2020-03-29 DIAGNOSIS — A084 Viral intestinal infection, unspecified: Secondary | ICD-10-CM

## 2020-03-29 DIAGNOSIS — F5101 Primary insomnia: Secondary | ICD-10-CM

## 2020-03-29 MED ORDER — ZOLPIDEM TARTRATE 10 MG PO TABS: 10.0000 mg | ORAL_TABLET | Freq: Every day | ORAL | 0 refills | Status: DC

## 2020-03-29 MED ORDER — ONDANSETRON HCL 4 MG PO TABS: 4.0000 mg | ORAL_TABLET | Freq: Three times a day (TID) | ORAL | 0 refills | Status: DC | PRN

## 2020-03-29 NOTE — Telephone Encounter (Signed)
Pt called requesting #30 of the Ambien. Writer informed pt that #15 had been prescribed due to her telling you that she is not taking med qhs. Pt states that she takes it nightly during the week when she's working. Last Rx 03/23/20. Next appointment on 06/01/20. Please review.

## 2020-03-29 NOTE — Progress Notes (Signed)

## 2020-03-29 NOTE — Telephone Encounter (Signed)
We will provide #30 Ambien this time however will discuss on her next appointment about dependency with hypnotics.

## 2020-04-30 ENCOUNTER — Encounter: Payer: Self-pay | Admitting: Family Medicine

## 2020-04-30 ENCOUNTER — Other Ambulatory Visit: Payer: Self-pay

## 2020-04-30 ENCOUNTER — Ambulatory Visit (INDEPENDENT_AMBULATORY_CARE_PROVIDER_SITE_OTHER): Payer: BC Managed Care – PPO | Admitting: Family Medicine

## 2020-04-30 VITALS — BP 108/58 | HR 91 | Temp 97.3°F | Resp 16 | Ht 65.0 in | Wt 217.6 lb

## 2020-04-30 DIAGNOSIS — G43709 Chronic migraine without aura, not intractable, without status migrainosus: Secondary | ICD-10-CM | POA: Diagnosis not present

## 2020-04-30 MED ORDER — METOPROLOL TARTRATE 25 MG PO TABS: 25.0000 mg | ORAL_TABLET | Freq: Two times a day (BID) | ORAL | 2 refills | Status: DC

## 2020-04-30 NOTE — Patient Instructions (Addendum)
Thank you for coming to the office today.  Notify me in 2 weeks with decision on medicine we can order or give sample.  Check into insurance for cost and coverage of the following migraine prevention  Medications  Pill - Nurtec ODT 75mg  - can do abortive therapy (stop migraine) about 8 pills per month, use as needed, preferred max dose is one every 2 days.  - ALSO Nurtec ODT 75mg  same medicine can be approved for "Preventative" Prophylaxis use to prevent migraines, can be taken ONE pill every OTHER day, 16 pills per month.  There is a special mail order pharmacy for this that we can use and help get this approved and quick access to medications.  Injection  Emgality (favorite) Others include Aimovig and Ajovy - Typically one dose a month - auto injector - for prevention as well.  We do have samples of this injection if you want to try the initial sample loading dose to get started for free before we order it.   START with Metoprolol 25mg  twice a day for migraine prevention, can adjust dose in future if needed, this is a blood pressure medication, keep in mind, it usually is weak for blood pressure but can slow heart rate down.  If we need to try one more medicine can use Topamax - can discuss in future.  Cannot use anti depressants because you are already on one.   Please schedule a Follow-up Appointment to: Return in about 4 weeks (around 05/28/2020) for 4 week follow-up migraines, can do virtual, or notify sooner if no issues can order the medicine.  If you have any other questions or concerns, please feel free to call the office or send a message through MyChart. You may also schedule an earlier appointment if necessary.  Additionally, you may be receiving a survey about your experience at our office within a few days to 1 week by e-mail or mail. We value your feedback.  , DO [facility], [place]

## 2020-04-30 NOTE — Progress Notes (Signed)
Subjective:    Patient ID: Angela Knox, female    DOB: [birth year], 40 y.o.   MRN: [id number]  Angela Knox is a 40 y.o. female presenting on 04/30/2020 for Migraine   HPI   MIGRAINE HEADACHE:  - Last visit with me 01/2020, for initial visit for same problem Migraines, treated with Sumatriptan, see prior notes for background information. - Interval update with increasing frequency migraines worse with holiday christmas lights and driving at night would trigger also with raining was another trigger for her - Today patient reports onset migraine, typical symptoms for her with frequent migraines about at least once per week lasting several days, overall >15 migraine headache days per month  Describes - Quality: throbbing, pulsing. With increasing intensity and has some movement or radiation across head Duration of headache without treatment: 1 day, resolved or improves with sleep.  Accompanying symptoms: nausea, no vomiting, photophobia, phonophobia, no lacrimation, no rhinorrhea, no neurologic symptoms, numbness or tingling  Previous history onset usually day 2 of menstrual cycle, has typical 28 day cycle while on Loestrin med currently followed by [facility]  Overall onset migraine headaches onset early 2021.   History of headaches: no significant history. Migraines or other. No fam history of migraines History of imaging: None Known triggers: menstrual cycle, rain, lights   Major Depression, recurrent in partial remission Followed by psychiatry Currently doing well on medication, Trintellix   Health Maintenance: UTD COVID vaccine and booster  Depression screen [facility] 2/9 04/30/2020 02/11/2020 07/21/2019  Decreased Interest 0 0 1  Down, Depressed, Hopeless 0 0 1  PHQ - 2 Score 0 0 2  Altered sleeping 1 1 3   Tired, decreased energy 0 0 1  Change in appetite 0 0 1  Feeling bad or failure about yourself  0 0 0  Trouble concentrating 0 0 1  Moving slowly or  fidgety/restless 0 0 0  Suicidal thoughts 0 0 0  PHQ-9 Score 1 1 8   Difficult doing work/chores Not difficult at all - Not difficult at all      Social History   Tobacco Use   Smoking status: Never Smoker   Smokeless tobacco: Never Used  Vaping Use   Vaping Use: Never used  Substance Use Topics   Alcohol use: Yes    Alcohol/week: 1.0 standard drink    Types: 1 Standard drinks or equivalent per week    Comment: occasional   Drug use: No    Review of Systems Per HPI unless specifically indicated above     Objective:    BP (!) 108/58    Pulse 91    Temp (!) 97.3 F (36.3 C) (Temporal)    Resp 16    Ht 5\' 5"  (1.651 m)    Wt 217 lb 9.6 oz (98.7 kg)    SpO2 98%    BMI 36.21 kg/m   Wt Readings from Last 3 Encounters:  04/30/20 217 lb 9.6 oz (98.7 kg)  02/11/20 218 lb (98.9 kg)  07/21/19 217 lb (98.4 kg)    Physical Exam Vitals and nursing note reviewed.  Constitutional:      General: She is not in acute distress.    Appearance: She is well-developed and well-nourished. She is not diaphoretic.     Comments: Well-appearing, comfortable, cooperative  HENT:     Head: Normocephalic and atraumatic.     Mouth/Throat:     Mouth: Oropharynx is clear and moist.  Eyes:     General:  Right eye: No discharge.        Left eye: No discharge.     Conjunctiva/sclera: Conjunctivae normal.  Cardiovascular:     Rate and Rhythm: Normal rate.  Pulmonary:     Effort: Pulmonary effort is normal.  Musculoskeletal:        General: No edema.  Skin:    General: Skin is warm and dry.     Findings: No erythema or rash.  Neurological:     Mental Status: She is alert and oriented to person, place, and time.  Psychiatric:        Mood and Affect: Mood and affect normal.        Behavior: Behavior normal.     Comments: Well groomed, good eye contact, normal speech and thoughts       Results for orders placed or performed in visit on 01/05/20  Novel Coronavirus, NAA ([facility])    Specimen: Nasopharyngeal(NP) swabs in vial transport medium   Nasopharynge  Screenin  Result Value Ref Range   SARS-CoV-2, NAA Not Detected Not Detected  SARS-COV-2, NAA 2 DAY TAT   Nasopharynge  Screenin  Result Value Ref Range   SARS-CoV-2, NAA 2 DAY TAT Performed       Assessment & Plan:   Problem List Items Addressed This Visit    Chronic migraine without aura without status migrainosus, not intractable - Primary   Relevant Medications   metoprolol tartrate (LOPRESSOR) 25 MG tablet      Chronic migraines, persistent Onset 9-12 month ago Triggers - menstrual cycle, rain/storm, lights Failed NSAID Comorbid Depression - history on previous SSRI medications. Already on Trintellix now.  Plan: 1. For abortive option - continue Sumatriptan, has refills - notify if need more. Use PRN - mixed results. 2. Discussed today goal to start migraine prophylaxis - reviewed options, not ideal candidate for anti depressant category since managed by psych already on Trintellix. Will pursue Metoprolol 25mg  BID beta blocker option, her BP is normal, but pulse ranges 90-100s commonly. Reviewed benefit risk - Also handout information on CGRP medications, discuss Nurtec ODT abortive/preventative and Injectable medications, per AVS, she will check cost coverage, coupons etc. Trial on BB if unsuccessful we can order one of these options. She will notify in 2 weeks approx or f/u at upcoming visit    Meds ordered this encounter  Medications   metoprolol tartrate (LOPRESSOR) 25 MG tablet    Sig: Take 1 tablet (25 mg total) by mouth 2 (two) times daily. For migraine prevention    Dispense:  60 tablet    Refill:  2      Follow up plan: Return in about 4 weeks (around 05/28/2020) for 4 week follow-up migraines, can do virtual, or notify 07/26/2020 sooner if no issues can order the medicine.   [place], DO [facility] 04/30/2020, 2:40 PM

## 2020-05-10 ENCOUNTER — Telehealth: Payer: BC Managed Care – PPO | Admitting: Family

## 2020-05-10 DIAGNOSIS — J019 Acute sinusitis, unspecified: Secondary | ICD-10-CM | POA: Diagnosis not present

## 2020-05-10 MED ORDER — DOXYCYCLINE HYCLATE 100 MG PO TABS: 100.0000 mg | ORAL_TABLET | Freq: Two times a day (BID) | ORAL | 0 refills | Status: DC

## 2020-05-10 NOTE — Progress Notes (Signed)

## 2020-05-13 ENCOUNTER — Other Ambulatory Visit: Payer: Self-pay | Admitting: Family Medicine

## 2020-05-13 ENCOUNTER — Telehealth: Payer: BC Managed Care – PPO | Admitting: Emergency Medicine

## 2020-05-13 DIAGNOSIS — G43709 Chronic migraine without aura, not intractable, without status migrainosus: Secondary | ICD-10-CM

## 2020-05-13 DIAGNOSIS — Z789 Other specified health status: Secondary | ICD-10-CM

## 2020-05-13 MED ORDER — AZITHROMYCIN 250 MG PO TABS: ORAL_TABLET | ORAL | 0 refills | Status: DC

## 2020-05-13 NOTE — Progress Notes (Signed)
We are sorry that you are not feeling well.  Here is how we plan to help!  Sure, I'll switch you to a Z-pak.  It's not as affective as the Doxycycline, but based on your allergies, might be your only choice.  Hope you feel better soon.  Some authorities believe that zinc sprays or the use of Echinacea may shorten the course of your symptoms.  Sinus infections are not as easily transmitted as other respiratory infection, however we still recommend that you avoid close contact with loved ones, especially the very young and elderly.  Remember to wash your hands thoroughly throughout the day as this is the number one way to prevent the spread of infection!  Home Care:  Only take medications as instructed by your medical team.  Do not take these medications with alcohol.  A steam or ultrasonic humidifier can help congestion.  You can place a towel over your head and breathe in the steam from hot water coming from a faucet.  Avoid close contacts especially the very young and the elderly.  Cover your mouth when you cough or sneeze.  Always remember to wash your hands.  Get Help Right Away If:  You develop worsening fever or sinus pain.  You develop a severe head ache or visual changes.  Your symptoms persist after you have completed your treatment plan.  Make sure you  Understand these instructions.  Will watch your condition.  Will get help right away if you are not doing well or get worse.  Your e-visit answers were reviewed by a board certified advanced clinical practitioner to complete your personal care plan.  Depending on the condition, your plan could have included both over the counter or prescription medications.  If there is a problem please reply  once you have received a response from your provider.  Your safety is important to [place].  If you have drug allergies check your prescription carefully.    You can use MyChart to ask questions about today's visit, request a  non-urgent call back, or ask for a work or school excuse for 24 hours related to this e-Visit. If it has been greater than 24 hours you will need to follow up with your provider, or enter a new e-Visit to address those concerns.  You will get an e-mail in the next two days asking about your experience.  I hope that your e-visit has been valuable and will speed your recovery. Thank you for using e-visits.   Approximately 5 minutes was used in reviewing the patient's chart, questionnaire, prescribing medications, and documentation.

## 2020-05-20 NOTE — Addendum Note (Signed)
Addended by: Smitty Cords on: 05/20/2020 01:02 PM   Modules accepted: Orders

## 2020-05-21 ENCOUNTER — Other Ambulatory Visit (HOSPITAL_COMMUNITY): Payer: Self-pay | Admitting: Psychiatry

## 2020-05-21 DIAGNOSIS — F5101 Primary insomnia: Secondary | ICD-10-CM

## 2020-05-24 ENCOUNTER — Other Ambulatory Visit (HOSPITAL_COMMUNITY): Payer: Self-pay | Admitting: *Deleted

## 2020-05-24 ENCOUNTER — Telehealth (HOSPITAL_COMMUNITY): Payer: Self-pay | Admitting: *Deleted

## 2020-05-24 DIAGNOSIS — F5101 Primary insomnia: Secondary | ICD-10-CM

## 2020-05-24 MED ORDER — ZOLPIDEM TARTRATE 10 MG PO TABS: 10.0000 mg | ORAL_TABLET | Freq: Every day | ORAL | 0 refills | Status: DC

## 2020-05-24 NOTE — Telephone Encounter (Signed)
Pt called requesting refill of the Ambien. Per our last refill it was stated that you would assess need on next appointment on 06/01/20. No refill? Or #15? Please review and advise.

## 2020-05-24 NOTE — Telephone Encounter (Signed)
Please provide 15 tablets and we will discuss on her next appointment.

## 2020-05-25 ENCOUNTER — Encounter: Payer: Self-pay | Admitting: Certified Nurse Midwife

## 2020-05-25 ENCOUNTER — Other Ambulatory Visit: Payer: Self-pay

## 2020-05-25 ENCOUNTER — Ambulatory Visit (INDEPENDENT_AMBULATORY_CARE_PROVIDER_SITE_OTHER): Payer: BC Managed Care – PPO | Admitting: Certified Nurse Midwife

## 2020-05-25 VITALS — BP 110/70 | HR 102 | Ht 65.0 in | Wt 220.1 lb

## 2020-05-25 DIAGNOSIS — Z1159 Encounter for screening for other viral diseases: Secondary | ICD-10-CM

## 2020-05-25 DIAGNOSIS — Z01419 Encounter for gynecological examination (general) (routine) without abnormal findings: Secondary | ICD-10-CM | POA: Diagnosis not present

## 2020-05-25 MED ORDER — VITAMIN D (ERGOCALCIFEROL) 1.25 MG (50000 UNIT) PO CAPS: 50000.0000 [IU] | ORAL_CAPSULE | ORAL | 11 refills | Status: DC

## 2020-05-25 MED ORDER — ADDYI 100 MG PO TABS: 100.0000 mg | ORAL_TABLET | Freq: Every day | ORAL | 2 refills | Status: DC

## 2020-05-25 NOTE — Addendum Note (Signed)
Addended by: Mechele Claude on: 05/25/2020 03:01 PM   Modules accepted: Orders

## 2020-05-25 NOTE — Patient Instructions (Signed)
Preventive Care 21-39 Years Old, Female Preventive care refers to lifestyle choices and visits with your health care provider that can promote health and wellness. This includes:  A yearly physical exam. This is also called an annual wellness visit.  Regular dental and eye exams.  Immunizations.  Screening for certain conditions.  Healthy lifestyle choices, such as: ? Eating a healthy diet. ? Getting regular exercise. ? Not using drugs or products that contain nicotine and tobacco. ? Limiting alcohol use. What can I expect for my preventive care visit? Physical exam Your health care provider may check your:  Height and weight. These may be used to calculate your BMI (body mass index). BMI is a measurement that tells if you are at a healthy weight.  Heart rate and blood pressure.  Body temperature.  Skin for abnormal spots. Counseling Your health care provider may ask you questions about your:  Past medical problems.  Family's medical history.  Alcohol, tobacco, and drug use.  Emotional well-being.  Home life and relationship well-being.  Sexual activity.  Diet, exercise, and sleep habits.  Work and work environment.  Access to firearms.  Method of birth control.  Menstrual cycle.  Pregnancy history. What immunizations do I need? Vaccines are usually given at various ages, according to a schedule. Your health care provider will recommend vaccines for you based on your age, medical history, and lifestyle or other factors, such as travel or where you work.   What tests do I need? Blood tests  Lipid and cholesterol levels. These may be checked every 5 years starting at age 20.  Hepatitis C test.  Hepatitis B test. Screening  Diabetes screening. This is done by checking your blood sugar (glucose) after you have not eaten for a while (fasting).  STD (sexually transmitted disease) testing, if you are at risk.  BRCA-related cancer screening. This may be  done if you have a family history of breast, ovarian, tubal, or peritoneal cancers.  Pelvic exam and Pap test. This may be done every 3 years starting at age 21. Starting at age 30, this may be done every 5 years if you have a Pap test in combination with an HPV test. Talk with your health care provider about your test results, treatment options, and if necessary, the need for more tests.   Follow these instructions at home: Eating and drinking  Eat a healthy diet that includes fresh fruits and vegetables, whole grains, lean protein, and low-fat dairy products.  Take vitamin and mineral supplements as recommended by your health care provider.  Do not drink alcohol if: ? Your health care provider tells you not to drink. ? You are pregnant, may be pregnant, or are planning to become pregnant.  If you drink alcohol: ? Limit how much you have to 0-1 drink a day. ? Be aware of how much alcohol is in your drink. In the [place] one drink equals one 12 oz bottle of beer (355 mL), one 5 oz glass of wine (148 mL), or one 1 oz glass of hard liquor (44 mL).   Lifestyle  Take daily care of your teeth and gums. Brush your teeth every morning and night with fluoride toothpaste. Floss one time each day.  Stay active. Exercise for at least 30 minutes 5 or more days each week.  Do not use any products that contain nicotine or tobacco, such as cigarettes, e-cigarettes, and chewing tobacco. If you need help quitting, ask your health care provider.  Do not   use drugs.  If you are sexually active, practice safe sex. Use a condom or other form of protection to prevent STIs (sexually transmitted infections).  If you do not wish to become pregnant, use a form of birth control. If you plan to become pregnant, see your health care provider for a prepregnancy visit.  Find healthy ways to cope with stress, such as: ? Meditation, yoga, or listening to music. ? Journaling. ? Talking to a trusted  person. ? Spending time with friends and family. Safety  Always wear your seat belt while driving or riding in a vehicle.  Do not drive: ? If you have been drinking alcohol. Do not ride with someone who has been drinking. ? When you are tired or distracted. ? While texting.  Wear a helmet and other protective equipment during sports activities.  If you have firearms in your house, make sure you follow all gun safety procedures.  Seek help if you have been physically or sexually abused. What's next?  Go to your health care provider once a year for an annual wellness visit.  Ask your health care provider how often you should have your eyes and teeth checked.  Stay up to date on all vaccines. This information is not intended to replace advice given to you by your health care provider. Make sure you discuss any questions you have with your health care provider. Document Revised: 12/07/2019 Document Reviewed: 12/20/2017 Elsevier Patient Education  2021 Elsevier Inc.  

## 2020-05-25 NOTE — Progress Notes (Addendum)
GYNECOLOGY ANNUAL PREVENTATIVE CARE ENCOUNTER NOTE  History:     Angela Knox is a 41 y.o. G78P1011 female here for a routine annual gynecologic exam.  Current complaints: decreased libido    Denies abnormal vaginal bleeding, discharge, pelvic pain, problems with intercourse or other gynecologic concerns.     Social Relationship: married  Living: spouse and daughter Work: Environmental manager  Exercise: 2 times wk for 20-30 min.  Smoke/Alcohol/drug use: rare alcohol use   Gynecologic History Patient's last menstrual period was 05/16/2020 (exact date). Contraception: none Last Pap: .05/02/18  Results were: normal  Last mammogram: n/a    Upstream - 05/25/20 1431      Pregnancy Intention Screening   Does the patient want to become pregnant in the next year? Unsure    Does the patient's partner want to become pregnant in the next year? Unsure    Would the patient like to discuss contraceptive options today? No      Contraception Wrap Up   Contraception Counseling Provided No          The pregnancy intention screening data noted above was reviewed. Potential methods of contraception were discussed. The patient elected to proceed with No Method - Other Reason.    Obstetric History OB History  Gravida Para Term Preterm AB Living  2 1 1  0 1 1  SAB IAB Ectopic Multiple Live Births  1 0 0 0 1    # Outcome Date GA Lbr Len/2nd Weight Sex Delivery Anes PTL Lv  2 Term 12/23/17 [redacted]w[redacted]d / 02:36 7 lb 13.9 oz (3.57 kg) F Vag-Spont EPI  LIV  1 SAB 2018 [redacted]w[redacted]d           Past Medical History:  Diagnosis Date  . Anxiety   . Depression   . Insomnia   . Tachycardia     Past Surgical History:  Procedure Laterality Date  . APPENDECTOMY      Current Outpatient Medications on File Prior to Visit  Medication Sig Dispense Refill  . montelukast (SINGULAIR) 10 MG tablet Take by mouth.    . SUMAtriptan (IMITREX) 50 MG tablet TAKE 1 TABLET BY MOUTH ONCE AS NEEDED FOR UP TO 1 DOSE FOR  MIGRAINE. MAY REPEAT ONE DOSE IN 2 HOURS IF HEADACHE PERSISTS, FOR MAX DOSE 24 HOURS 12 tablet 1  . vortioxetine HBr (TRINTELLIX) 20 MG TABS tablet Take one tab at bed time 30 tablet 2  . zolpidem (AMBIEN) 10 MG tablet Take 1 tablet (10 mg total) by mouth at bedtime. for sleep 15 tablet 0  . azithromycin (ZITHROMAX) 250 MG tablet Take 2 tabs today, then take 1 tab daily until gone. 6 tablet 0  . doxycycline (VIBRA-TABS) 100 MG tablet Take 1 tablet (100 mg total) by mouth 2 (two) times daily. 20 tablet 0  . Vitamin D, Ergocalciferol, (DRISDOL) 1.25 MG (50000 UNIT) CAPS capsule Take 50,000 Units by mouth once a week.     No current facility-administered medications on file prior to visit.    Allergies  Allergen Reactions  . Amoxicillin     Nausea and vomiting on 03/15/18  . Ciprofloxacin Other (See Comments)    Body aches  . Asa [Aspirin] Itching and Rash    Social History:  reports that she has never smoked. She has never used smokeless tobacco. She reports previous alcohol use of about 1.0 standard drink of alcohol per week. She reports that she does not use drugs.  Family History  Problem Relation Age of  Onset  . Heart disease Brother   . Kidney disease Brother   . Hypertension Brother   . Hyperlipidemia Father   . Depression Father   . Cancer Maternal Grandmother   . Colon polyps Mother   . Depression Sister     The following portions of the patient's history were reviewed and updated as appropriate: allergies, current medications, past family history, past medical history, past social history, past surgical history and problem list.  Review of Systems Pertinent items noted in HPI and remainder of comprehensive ROS otherwise negative.  Physical Exam:  BP 110/70   Pulse (!) 102   Ht 5\' 5"  (1.651 m)   Wt 220 lb 1 oz (99.8 kg)   LMP 05/16/2020 (Exact Date)   Breastfeeding No   BMI 36.62 kg/m  CONSTITUTIONAL: Well-developed, well-nourished, obese female in no acute  distress.  HENT:  Normocephalic, atraumatic, External right and left ear normal. Oropharynx is clear and moist EYES: Conjunctivae and EOM are normal. Pupils are equal, round, and reactive to light. No scleral icterus.  NECK: Normal range of motion, supple, no masses.  Normal thyroid.  SKIN: Skin is warm and dry. No rash noted. Not diaphoretic. No erythema. No pallor. MUSCULOSKELETAL: Normal range of motion. No tenderness.  No cyanosis, clubbing, or edema.  2+ distal pulses. NEUROLOGIC: Alert and oriented to person, place, and time. Normal reflexes, muscle tone coordination.  PSYCHIATRIC: Normal mood and affect. Normal behavior. Normal judgment and thought content. CARDIOVASCULAR: Normal heart rate noted, regular rhythm RESPIRATORY: Clear to auscultation bilaterally. Effort and breath sounds normal, no problems with respiration noted. BREASTS: Symmetric in size. No masses, tenderness, skin changes, nipple drainage, or lymphadenopathy bilaterally.  ABDOMEN: Soft, no distention noted.  No tenderness, rebound or guarding.  PELVIC: Normal appearing external genitalia and urethral meatus; normal appearing vaginal mucosa and cervix.  No abnormal discharge noted.  Pap smear not indicated.  Normal uterine size, no other palpable masses, no uterine or adnexal tenderness.  .   Assessment and Plan:    1. Women's annual routine gynecological examination  Pap: not due Mammogram : n/a  Labs: Hep C-declined due to cost  Refills/orders: Addyi  Referral: none Routine preventative health maintenance measures emphasized. Please refer to After Visit Summary for other counseling recommendations.      05/18/2020, CNM [facility],  [facility]

## 2020-05-26 ENCOUNTER — Telehealth: Payer: Self-pay

## 2020-05-26 NOTE — Telephone Encounter (Signed)
mychart message sent to patient

## 2020-05-28 ENCOUNTER — Other Ambulatory Visit: Payer: Self-pay

## 2020-05-28 ENCOUNTER — Other Ambulatory Visit: Payer: BC Managed Care – PPO

## 2020-05-28 DIAGNOSIS — Z1159 Encounter for screening for other viral diseases: Secondary | ICD-10-CM | POA: Diagnosis not present

## 2020-05-29 LAB — VIRAL HEPATITIS HBV, HCV
HCV Ab: 0.1 s/co ratio (ref 0.0–0.9)
Hep B Core Total Ab: NEGATIVE
Hep B Surface Ab, Qual: NONREACTIVE
Hepatitis B Surface Ag: NEGATIVE

## 2020-05-29 LAB — HCV INTERPRETATION

## 2020-05-31 ENCOUNTER — Other Ambulatory Visit (HOSPITAL_COMMUNITY): Payer: Self-pay | Admitting: Psychiatry

## 2020-05-31 DIAGNOSIS — F419 Anxiety disorder, unspecified: Secondary | ICD-10-CM

## 2020-05-31 DIAGNOSIS — F331 Major depressive disorder, recurrent, moderate: Secondary | ICD-10-CM

## 2020-06-01 ENCOUNTER — Encounter (HOSPITAL_COMMUNITY): Payer: Self-pay | Admitting: Psychiatry

## 2020-06-01 ENCOUNTER — Telehealth (INDEPENDENT_AMBULATORY_CARE_PROVIDER_SITE_OTHER): Payer: BC Managed Care – PPO | Admitting: Psychiatry

## 2020-06-01 ENCOUNTER — Other Ambulatory Visit: Payer: Self-pay

## 2020-06-01 DIAGNOSIS — F419 Anxiety disorder, unspecified: Secondary | ICD-10-CM

## 2020-06-01 DIAGNOSIS — F5101 Primary insomnia: Secondary | ICD-10-CM | POA: Diagnosis not present

## 2020-06-01 DIAGNOSIS — F331 Major depressive disorder, recurrent, moderate: Secondary | ICD-10-CM

## 2020-06-01 MED ORDER — ZOLPIDEM TARTRATE 10 MG PO TABS: 10.0000 mg | ORAL_TABLET | Freq: Every day | ORAL | 2 refills | Status: AC

## 2020-06-01 MED ORDER — VORTIOXETINE HBR 20 MG PO TABS: ORAL_TABLET | ORAL | 2 refills | Status: DC

## 2020-06-01 NOTE — Progress Notes (Signed)
Virtual Visit via Video Note  I connected with Angela Knox on 06/01/20 at  4:00 PM EST by a video enabled telemedicine application and verified that I am speaking with the correct person using two identifiers.  Location: Patient: Work Provider: Economist   I discussed the limitations of evaluation and management by telemedicine and the availability of in person appointments. The patient expressed understanding and agreed to proceed.  History of Present Illness: Patient is evaluated by video session.  She is working.  She is taking the Trintellix and she feels it is working good for anxiety and depression.  She denies any panic attack.  She had good holidays.  She takes Ambien more frequently to help with sleep but also noticed sometimes sleep is not good which she blames because her 26-year-old daughter comes in her bed.  She had a good support from her husband.  She has no issues or concerns from the medication.  She denies any crying spells or any feeling of hopelessness or worthlessness.  She admitted few pound weight gain because she is not exercising and watching her calorie intake.  She also has not started therapy appointments because she was busy at work.  She like to keep the current medication.  Her energy level is good.  Her appetite is okay.  She has no tremors, shakes or any EPS.   Past Psychiatric History: H/Odepression and anxiety since 2012.TookWellbutrinthan Prozac. Afterside effectsfrom Prozac switched toLexapro. No h/oinpatient, suicidal attempt, psychosis or abuse. Never seen psychiatrist before.  Psychiatric Specialty Exam: Physical Exam  Review of Systems  Weight 220 lb (99.8 kg), last menstrual period 05/16/2020.There is no height or weight on file to calculate BMI.  General Appearance: Casual  Eye Contact:  Good  Speech:  Normal Rate  Volume:  Normal  Mood:  pleasent  Affect:  Congruent  Thought Process:  Goal Directed  Orientation:  Full (Time,  Place, and Person)  Thought Content:  Logical  Suicidal Thoughts:  No  Homicidal Thoughts:  No  Memory:  Immediate;   Good Recent;   Good Remote;   Good  Judgement:  Good  Insight:  Present  Psychomotor Activity:  Normal  Concentration:  Concentration: Good and Attention Span: Good  Recall:  Good  Fund of Knowledge:  Good  Language:  Good  Akathisia:  No  Handed:  Right  AIMS (if indicated):     Assets:  Communication Skills Desire for Improvement Housing Physical Health Resilience Social Support Talents/Skills Transportation  ADL's:  Intact  Cognition:  WNL  Sleep:   fair      Assessment and Plan: Major depressive disorder, recurrent.  Anxiety.  Primary insomnia.  Discussed hypnotics and abuse.  Recommend to take the Ambien as needed and explained taking every night may cause dependency issues and tolerance.  She agreed with the plan.  We will provide 20 tablets of Ambien 10 mg to take as needed.  Encourage healthy lifestyle and watch her calorie intake and consider restart therapy.  Patient does not want to change the Trintellix since it is working well.  Continue Trintellix 20 mg daily.  Recommended to call us back if there is any question or any concern.  Follow-up in 3 months.  Follow Up Instructions:    I discussed the assessment and treatment plan with the patient. The patient was provided an opportunity to ask questions and all were answered. The patient agreed with the plan and demonstrated an understanding of the instructions.  The patient was advised to call back or seek an in-person evaluation if the symptoms worsen or if the condition fails to improve as anticipated.  I provided 13 minutes of non-face-to-face time during this encounter.   Kathlee Nations, MD

## 2020-06-08 DIAGNOSIS — F32A Depression, unspecified: Secondary | ICD-10-CM | POA: Diagnosis not present

## 2020-06-08 DIAGNOSIS — G4700 Insomnia, unspecified: Secondary | ICD-10-CM | POA: Diagnosis not present

## 2020-06-08 DIAGNOSIS — E669 Obesity, unspecified: Secondary | ICD-10-CM | POA: Diagnosis not present

## 2020-06-08 DIAGNOSIS — J4520 Mild intermittent asthma, uncomplicated: Secondary | ICD-10-CM | POA: Diagnosis not present

## 2020-06-08 DIAGNOSIS — Z0000 Encounter for general adult medical examination without abnormal findings: Secondary | ICD-10-CM | POA: Diagnosis not present

## 2020-06-18 ENCOUNTER — Ambulatory Visit: Payer: BC Managed Care – PPO | Admitting: Family Medicine

## 2020-07-24 ENCOUNTER — Telehealth: Payer: BC Managed Care – PPO | Admitting: Physician Assistant

## 2020-07-24 ENCOUNTER — Encounter: Payer: Self-pay | Admitting: Physician Assistant

## 2020-07-24 DIAGNOSIS — H60501 Unspecified acute noninfective otitis externa, right ear: Secondary | ICD-10-CM | POA: Diagnosis not present

## 2020-07-24 DIAGNOSIS — G4489 Other headache syndrome: Secondary | ICD-10-CM | POA: Diagnosis not present

## 2020-07-24 MED ORDER — NEOMYCIN-POLYMYXIN-HC 3.5-10000-1 OT SOLN: 4.0000 [drp] | Freq: Four times a day (QID) | OTIC | 0 refills | Status: AC

## 2020-07-24 NOTE — Progress Notes (Signed)
E Visit for Swimmer's Ear  We are sorry that you are not feeling well. Here is how we plan to help!  I have prescribed: Neomycin 0.35%, polymyxin B 10,000 units/mL, and hydrocortisone 0,5% otic solution 4 drops in affected ears four times a day for 7 days   Take Tylenol 500 mg - take 2 pills three times daily as needed for your headache. Take OTC Aleve for the headache- if you tolerate it with no allergies to the medicine. You have allergy to Aspirin and you may have a cross reaction allergy to Naproxen. If so, only take Tyelnol for the headache.  Follow up with your doctor for further management of your headache. If headache worsens, or if you develop any new symptoms please go to an urgent care or the ER In certain cases swimmer's ear may progress to a more serious bacterial infection of the middle or inner ear.  If you have a fever 102 and up and significantly worsening symptoms, this could indicate a more serious infection moving to the middle/inner and needs face to face evaluation in an office by a provider.  Your symptoms should improve over the next 3 days and should resolve in about 7 days.  HOME CARE:   Wash your hands frequently.  Do not place the tip of the bottle on your ear or touch it with your fingers.  You can take Acetominophen 650 mg every 4-6 hours as needed for pain.  If pain is severe or moderate, you can apply a heating pad (set on low) or hot water bottle (wrapped in a towel) to outer ear for 20 minutes.  This will also increase drainage.  Avoid ear plugs  Do not use Q-tips  After showers, help the water run out by tilting your head to one side.  GET HELP RIGHT AWAY IF:   Fever is over 102.2 degrees.  You develop progressive ear pain or hearing loss.  Ear symptoms persist longer than 3 days after treatment.  MAKE SURE YOU:   Understand these instructions.  Will watch your condition.  Will get help right away if you are not doing well or get  worse.  TO PREVENT SWIMMER'S EAR:  Use a bathing cap or custom fitted swim molds to keep your ears dry.  Towel off after swimming to dry your ears.  Tilt your head or pull your earlobes to allow the water to escape your ear canal.  If there is still water in your ears, consider using a hairdryer on the lowest setting.  Thank you for choosing an e-visit. Your e-visit answers were reviewed by a board certified advanced clinical practitioner to complete your personal care plan. Depending upon the condition, your plan could have included both over the counter or prescription medications. Please review your pharmacy choice. Be sure that the pharmacy you have chosen is open so that you can pick up your prescription now.  If there is a problem you may message your provider in MyChart to have the prescription routed to another pharmacy. Your safety is important to [place]. If you have drug allergies check your prescription carefully.  For the next 24 hours, you can use MyChart to ask questions about today's visit, request a non-urgent call back, or ask for a work or school excuse from your e-visit provider. You will get an email in the next two days asking about your experience. I hope that your e-visit has been valuable and will speed your recovery.  I spent 5-10 minutes on review and completion of this note- [facility]

## 2020-07-27 DIAGNOSIS — G43709 Chronic migraine without aura, not intractable, without status migrainosus: Secondary | ICD-10-CM | POA: Diagnosis not present

## 2020-08-02 ENCOUNTER — Other Ambulatory Visit: Payer: Self-pay | Admitting: Family Medicine

## 2020-08-02 DIAGNOSIS — G43709 Chronic migraine without aura, not intractable, without status migrainosus: Secondary | ICD-10-CM

## 2020-08-02 NOTE — Telephone Encounter (Signed)
   Notes to clinic: medication requested is not on current medication list  Medication was d/c   Requested Prescriptions  Pending Prescriptions Disp Refills   metoprolol tartrate (LOPRESSOR) 25 MG tablet [Pharmacy Med Name: METOPROLOL TARTRATE 25 MG TAB] 180 tablet     Sig: Take 1 tablet (25 mg total) by mouth 2 (two) times daily. For migraine prevention      Cardiovascular:  Beta Blockers Passed - 08/02/2020  1:30 AM      Passed - Last BP in normal range    BP Readings from Last 1 Encounters:  05/25/20 110/70          Passed - Last Heart Rate in normal range    Pulse Readings from Last 1 Encounters:  05/25/20 (!) 102          Passed - Valid encounter within last 6 months    Recent Outpatient Visits           3 months ago Chronic migraine without aura without status migrainosus, not intractable   [facility] [place], Netta Neat, DO   5 months ago Chronic migraine without aura without status migrainosus, not intractable   [facility] [place], Netta Neat, DO   1 year ago Major depression, recurrent, chronic [facility])   [facility], Netta Neat, DO       Future Appointments             In 10 months [name], CNM [facility]

## 2020-08-30 ENCOUNTER — Telehealth (HOSPITAL_COMMUNITY): Payer: BC Managed Care – PPO | Admitting: Psychiatry

## 2020-09-29 ENCOUNTER — Telehealth: Payer: BC Managed Care – PPO | Admitting: Nurse Practitioner

## 2020-09-29 DIAGNOSIS — J329 Chronic sinusitis, unspecified: Secondary | ICD-10-CM | POA: Diagnosis not present

## 2020-09-29 DIAGNOSIS — B9789 Other viral agents as the cause of diseases classified elsewhere: Secondary | ICD-10-CM

## 2020-09-29 MED ORDER — FLUTICASONE PROPIONATE 50 MCG/ACT NA SUSP: 2.0000 | Freq: Every day | NASAL | 6 refills | Status: DC

## 2020-09-29 NOTE — Progress Notes (Signed)

## 2020-10-05 DIAGNOSIS — G4700 Insomnia, unspecified: Secondary | ICD-10-CM | POA: Diagnosis not present

## 2020-10-05 DIAGNOSIS — E559 Vitamin D deficiency, unspecified: Secondary | ICD-10-CM | POA: Diagnosis not present

## 2020-10-05 DIAGNOSIS — G43709 Chronic migraine without aura, not intractable, without status migrainosus: Secondary | ICD-10-CM | POA: Diagnosis not present

## 2020-10-05 DIAGNOSIS — J4520 Mild intermittent asthma, uncomplicated: Secondary | ICD-10-CM | POA: Diagnosis not present

## 2020-10-05 DIAGNOSIS — Z131 Encounter for screening for diabetes mellitus: Secondary | ICD-10-CM | POA: Diagnosis not present

## 2020-10-05 DIAGNOSIS — F32A Depression, unspecified: Secondary | ICD-10-CM | POA: Diagnosis not present

## 2020-10-05 DIAGNOSIS — Z13220 Encounter for screening for lipoid disorders: Secondary | ICD-10-CM | POA: Diagnosis not present

## 2020-10-05 DIAGNOSIS — E669 Obesity, unspecified: Secondary | ICD-10-CM | POA: Diagnosis not present

## 2020-10-07 ENCOUNTER — Other Ambulatory Visit (HOSPITAL_COMMUNITY): Payer: Self-pay | Admitting: Family Medicine

## 2020-10-07 ENCOUNTER — Other Ambulatory Visit: Payer: Self-pay | Admitting: Family Medicine

## 2020-10-07 DIAGNOSIS — R748 Abnormal levels of other serum enzymes: Secondary | ICD-10-CM

## 2020-10-11 DIAGNOSIS — H9203 Otalgia, bilateral: Secondary | ICD-10-CM | POA: Diagnosis not present

## 2020-10-11 DIAGNOSIS — Z03818 Encounter for observation for suspected exposure to other biological agents ruled out: Secondary | ICD-10-CM | POA: Diagnosis not present

## 2020-10-11 DIAGNOSIS — J029 Acute pharyngitis, unspecified: Secondary | ICD-10-CM | POA: Diagnosis not present

## 2020-10-15 ENCOUNTER — Other Ambulatory Visit: Payer: Self-pay

## 2020-10-15 ENCOUNTER — Ambulatory Visit
Admission: RE | Admit: 2020-10-15 | Discharge: 2020-10-15 | Disposition: A | Discharge status: Home / Self Care | Payer: BC Managed Care – PPO | Source: Ambulatory Visit | Attending: Family Medicine | Admitting: Family Medicine

## 2020-10-15 ENCOUNTER — Other Ambulatory Visit: Payer: Self-pay | Admitting: Family Medicine

## 2020-10-15 DIAGNOSIS — R7989 Other specified abnormal findings of blood chemistry: Secondary | ICD-10-CM | POA: Diagnosis not present

## 2020-10-15 DIAGNOSIS — R197 Diarrhea, unspecified: Secondary | ICD-10-CM

## 2020-10-15 DIAGNOSIS — R112 Nausea with vomiting, unspecified: Secondary | ICD-10-CM | POA: Diagnosis not present

## 2020-10-15 DIAGNOSIS — R1011 Right upper quadrant pain: Secondary | ICD-10-CM | POA: Diagnosis not present

## 2020-10-15 DIAGNOSIS — R160 Hepatomegaly, not elsewhere classified: Secondary | ICD-10-CM | POA: Diagnosis not present

## 2020-10-15 DIAGNOSIS — K760 Fatty (change of) liver, not elsewhere classified: Secondary | ICD-10-CM | POA: Diagnosis not present

## 2020-10-18 ENCOUNTER — Other Ambulatory Visit (HOSPITAL_COMMUNITY): Payer: Self-pay | Admitting: Family Medicine

## 2020-10-18 ENCOUNTER — Other Ambulatory Visit: Payer: Self-pay | Admitting: Family Medicine

## 2020-10-18 DIAGNOSIS — R1011 Right upper quadrant pain: Secondary | ICD-10-CM

## 2020-10-18 DIAGNOSIS — R112 Nausea with vomiting, unspecified: Secondary | ICD-10-CM

## 2020-10-19 DIAGNOSIS — R7989 Other specified abnormal findings of blood chemistry: Secondary | ICD-10-CM | POA: Diagnosis not present

## 2020-10-19 DIAGNOSIS — R1011 Right upper quadrant pain: Secondary | ICD-10-CM | POA: Diagnosis not present

## 2020-10-19 DIAGNOSIS — R110 Nausea: Secondary | ICD-10-CM | POA: Diagnosis not present

## 2020-11-03 ENCOUNTER — Ambulatory Visit: Payer: BC Managed Care – PPO

## 2020-11-10 ENCOUNTER — Other Ambulatory Visit: Payer: Self-pay

## 2020-11-10 ENCOUNTER — Other Ambulatory Visit: Payer: BC Managed Care – PPO

## 2020-11-10 ENCOUNTER — Encounter: Payer: Self-pay | Admitting: Certified Nurse Midwife

## 2020-11-10 ENCOUNTER — Ambulatory Visit: Payer: BC Managed Care – PPO | Admitting: Certified Nurse Midwife

## 2020-11-10 VITALS — BP 113/80 | HR 103 | Ht 65.0 in | Wt 226.6 lb

## 2020-11-10 DIAGNOSIS — N926 Irregular menstruation, unspecified: Secondary | ICD-10-CM | POA: Diagnosis not present

## 2020-11-10 NOTE — Patient Instructions (Signed)
Polycystic Ovary Syndrome  Polycystic ovarian syndrome (PCOS) is a common hormonal disorder among women of reproductive age. In most women with PCOS, small fluid-filled sacs (cysts) grow on the ovaries. PCOS can cause problems with menstrual periods and make it hard to get and stay pregnant. If this condition is not treated, it can leadto serious health problems, such as diabetes and heart disease. What are the causes? The cause of this condition is not known. It may be due to certain factors, such as: Irregular menstrual cycle. High levels of certain hormones. Problems with the hormone that helps to control blood sugar (insulin). Certain genes. What increases the risk? You are more likely to develop this condition if you: Have a family history of PCOS or type 2 diabetes. Are overweight, eat unhealthy foods, and are not active. These factors may cause problems with blood sugar control, which can contribute to PCOS or PCOS symptoms. What are the signs or symptoms? Symptoms of this condition include: Ovarian cysts and sometimes pelvic pain. Menstrual periods that are not regular or are too heavy. Inability to get or stay pregnant. Increased growth of hair on the face, chest, stomach, back, thumbs, thighs, or toes. Acne or oily skin. Acne may develop during adulthood, and it may not get better with treatment. Weight gain or obesity. Patches of thickened and dark brown or black skin on the neck, arms, breasts, or thighs. How is this diagnosed? This condition is diagnosed based on: Your medical history. A physical exam that includes a pelvic exam. Your health care provider may look for areas of increased hair growth on your skin. Tests, such as: An ultrasound to check the ovaries for cysts and to view the lining of the uterus. Blood tests to check levels of sugar (glucose), female hormone (testosterone), and female hormones (estrogen and progesterone). How is this treated? There is no cure for  this condition, but treatment can help to manage symptoms and prevent more health problems from developing. Treatment varies depending onyour symptoms and if you want to have a baby or if you need birth control. Treatment may include: Making nutrition and lifestyle changes. Taking the progesterone hormone to start a menstrual period. Taking birth control pills to help you have regular menstrual periods. Taking medicines such as: Medicines to make you ovulate, if you want to get pregnant. Medicine to reduce extra hair growth. Having surgery in severe cases. This may involve making small holes in one or both of your ovaries. This decreases the amount of testosterone that your body makes. Follow these instructions at home: Take over-the-counter and prescription medicines only as told by your health care provider. Follow a healthy meal plan that includes lean proteins, complex carbohydrates, fresh fruits and vegetables, low-fat dairy products, healthy fats, and fiber. If you are overweight, lose weight as told by your health care provider. Your health care provider can determine how much weight loss is best for you and can help you lose weight safely. Keep all follow-up visits. This is important. Contact a health care provider if: Your symptoms do not get better with medicine. Your symptoms get worse or you develop new symptoms. Summary Polycystic ovarian syndrome (PCOS) is a common hormonal disorder among women of reproductive age. PCOS can cause problems with menstrual periods and make it hard to get and stay pregnant. If this condition is not treated, it can lead to serious health problems, such as diabetes and heart disease. There is no cure for this condition, but treatment can help   to manage symptoms and prevent more health problems from developing. This information is not intended to replace advice given to you by your health care provider. Make sure you discuss any questions you have with  your healthcare provider. Document Revised: 09/18/2019 Document Reviewed: 09/18/2019 Elsevier Patient Education  2022 Elsevier Inc.  

## 2020-11-10 NOTE — Progress Notes (Signed)
GYN ENCOUNTER NOTE  Subjective:       Angela Knox is a 40 y.o. G53P1011 female is here for gynecologic evaluation of the following issues:  1. Irregular menses for the past year. She is skipping approximatley 3 months at a time. She also admits to having difficulty with weight loss. .     Gynecologic History Patient's last menstrual period was 11/06/2020 (exact date). Contraception: none Last Pap: 05/02/18. Results were: normal Last mammogram: n/a  Obstetric History OB History  Gravida Para Term Preterm AB Living  2 1 1  0 1 1  SAB IAB Ectopic Multiple Live Births  1 0 0 0 1    # Outcome Date GA Lbr Len/2nd Weight Sex Delivery Anes PTL Lv  2 Term 12/23/17 [redacted]w[redacted]d / 02:36 7 lb 13.9 oz (3.57 kg) F Vag-Spont EPI  LIV  1 SAB 2018 [redacted]w[redacted]d           Past Medical History:  Diagnosis Date   Anxiety    Depression    Insomnia    Tachycardia     Past Surgical History:  Procedure Laterality Date   APPENDECTOMY      Current Outpatient Medications on File Prior to Visit  Medication Sig Dispense Refill   SUMAtriptan (IMITREX) 50 MG tablet TAKE 1 TABLET BY MOUTH ONCE AS NEEDED FOR UP TO 1 DOSE FOR MIGRAINE. MAY REPEAT ONE DOSE IN 2 HOURS IF HEADACHE PERSISTS, FOR MAX DOSE 24 HOURS 12 tablet 1   zolpidem (AMBIEN) 10 MG tablet Take 1 tablet (10 mg total) by mouth at bedtime. for sleep 20 tablet 2   Flibanserin (ADDYI) 100 MG TABS Take 100 mg by mouth at bedtime. 30 tablet 2   fluticasone (FLONASE) 50 MCG/ACT nasal spray Place 2 sprays into both nostrils daily. 16 g 6   montelukast (SINGULAIR) 10 MG tablet Take by mouth.     Vitamin D, Ergocalciferol, (DRISDOL) 1.25 MG (50000 UNIT) CAPS capsule Take 1 capsule (50,000 Units total) by mouth once a week. 5 capsule 11   vortioxetine HBr (TRINTELLIX) 20 MG TABS tablet Take one tab at bed time 30 tablet 2   No current facility-administered medications on file prior to visit.    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 / [employer]  Tobacco Use   Smoking status: Never   Smokeless tobacco: Never  Vaping Use   Vaping Use: Never used  Substance and Sexual Activity   Alcohol use: Not Currently    Alcohol/week: 1.0 standard drink    Types: 1 Standard drinks or equivalent per week   Drug use: No   Sexual activity: Not Currently    Birth control/protection: None    Comment: 2/2 to PFM issues  Other Topics Concern   Not on file  Social History Narrative            Social Determinants of Health   Financial Resource Strain: Not on file  Food Insecurity: Not on file  Transportation Needs: Not  on file  Physical Activity: Not on file  Stress: Not on file  Social Connections: Not on file  Intimate Partner Violence: Not on file    Family History  Problem Relation Age of Onset   Heart disease Brother    Kidney disease Brother    Hypertension Brother    Hyperlipidemia Father    Depression Father    Cancer Maternal Grandmother    Colon polyps Mother    Depression Sister     The following portions of the patient's history were reviewed and updated as appropriate: allergies, current medications, past family history, past medical history, past social history, past surgical history and problem list.  Review of Systems Review of Systems - Negative except as mentioned in HPI Review of Systems - General ROS: negative for - chills, fatigue, fever, hot flashes, malaise or night sweats Hematological and Lymphatic ROS: negative for - bleeding problems or swollen lymph nodes Gastrointestinal ROS: negative for -  abdominal pain, blood in stools, change in bowel habits and nausea/vomiting Musculoskeletal ROS: negative for - joint pain, muscle pain or muscular weakness Genito-Urinary ROS: negative for -  dysmenorrhea, dyspareunia, dysuria, genital discharge, genital ulcers, hematuria, incontinence, heavy menses, nocturia or pelvic pain. Positive for change in cycle, irregular   Objective:   BP 113/80   Pulse (!) 103   Ht 5\' 5"  (1.651 m)   Wt 226 lb 9.6 oz (102.8 kg)   LMP 11/06/2020 (Exact Date)   BMI 37.71 kg/m  CONSTITUTIONAL: Well-developed, well-nourished female in no acute distress.  HENT:  Normocephalic, atraumatic.  NECK: Normal range of motion, supple, no masses.  Normal thyroid.  SKIN: Skin is warm and dry. No rash noted. Not diaphoretic. No erythema. No pallor. NEUROLGIC: Alert and oriented to person, place, and time. PSYCHIATRIC: Normal mood and affect. Normal behavior. Normal judgment and thought content. CARDIOVASCULAR:Not Examined RESPIRATORY: Not Examined BREASTS: Not Examined ABDOMEN: Soft, non distended; Non tender.  No Organomegaly. PELVIC:not indicated, pt had pelvic in February  MUSCULOSKELETAL: Normal range of motion. No tenderness.  No cyanosis, clubbing, or edema.     Assessment:  Irregular menses   Plan:   Discussed potential causes , specifically PCOS. Orders placed for labs and u/s . Discussed use of BC to regulate cycle. She is unsure at this time. Information booklet given on [facility] options. She will let me know what she decides. Follow up for u/s and prn.   [facility] CNM

## 2020-11-11 LAB — FSH/LH
FSH: 5.4 m[IU]/mL
LH: 4.4 m[IU]/mL

## 2020-11-11 LAB — TESTOSTERONE: Testosterone: 4 ng/dL — ABNORMAL LOW (ref 8–60)

## 2020-11-11 LAB — PROLACTIN: Prolactin: 6.9 ng/mL (ref 4.8–23.3)

## 2020-11-11 LAB — TSH: TSH: 1.45 u[IU]/mL (ref 0.450–4.500)

## 2020-11-12 ENCOUNTER — Other Ambulatory Visit: Payer: BC Managed Care – PPO

## 2020-11-17 ENCOUNTER — Telehealth: Payer: BC Managed Care – PPO | Admitting: Physician Assistant

## 2020-11-17 DIAGNOSIS — H1032 Unspecified acute conjunctivitis, left eye: Secondary | ICD-10-CM

## 2020-11-18 ENCOUNTER — Other Ambulatory Visit: Payer: Self-pay

## 2020-11-18 ENCOUNTER — Encounter
Admission: RE | Admit: 2020-11-18 | Discharge: 2020-11-18 | Disposition: A | Discharge status: Home / Self Care | Payer: BC Managed Care – PPO | Source: Ambulatory Visit | Attending: Family Medicine | Admitting: Family Medicine

## 2020-11-18 DIAGNOSIS — R109 Unspecified abdominal pain: Secondary | ICD-10-CM | POA: Diagnosis not present

## 2020-11-18 DIAGNOSIS — R112 Nausea with vomiting, unspecified: Secondary | ICD-10-CM | POA: Diagnosis not present

## 2020-11-18 DIAGNOSIS — R1011 Right upper quadrant pain: Secondary | ICD-10-CM | POA: Insufficient documentation

## 2020-11-18 MED ORDER — TECHNETIUM TC 99M MEBROFENIN IV KIT
5.0000 | PACK | Freq: Once | INTRAVENOUS | Status: AC | PRN
  Administered 2020-11-18: 5.1 via INTRAVENOUS

## 2020-11-18 MED ORDER — POLYMYXIN B-TRIMETHOPRIM 10000-0.1 UNIT/ML-% OP SOLN: OPHTHALMIC | 0 refills | Status: DC

## 2020-11-18 NOTE — Progress Notes (Signed)
I have spent 5 minutes in review of e-visit questionnaire, review and updating patient chart, medical decision making and response to patient.   Chamaine Stankus Cody Hady Niemczyk, PA-C    

## 2020-11-18 NOTE — Progress Notes (Signed)
E-Visit for [employer]   We are sorry that you are not feeling well.  Here is how we plan to help!  Based on what you have shared with me it looks like you have conjunctivitis.  Conjunctivitis is a common inflammatory or infectious condition of the eye that is often referred to as "pink eye".  In most cases it is contagious (viral or bacterial). However, not all conjunctivitis requires antibiotics (ex. Allergic).  We have made appropriate suggestions for you based upon your presentation. I would avoid use of the eye makeup around the affected eye for now. Since both eyes are not being affected, it is less likely allergy-mediated but more so some of the makeup got in the eye and causing direct irritation.   I have prescribed Polytrim Ophthalmic drops 1-2 drops 4 times a day times 5 days  Pink eye can be highly contagious.  It is typically spread through direct contact with secretions, or contaminated objects or surfaces that one may have touched.  Strict handwashing is suggested with soap and water is urged.  If not available, use alcohol based had sanitizer.  Avoid unnecessary touching of the eye.  If you wear contact lenses, you will need to refrain from wearing them until you see no white discharge from the eye for at least 24 hours after being on medication.  You should see symptom improvement in 1-2 days after starting the medication regimen.  Call us if symptoms are not improved in 1-2 days.  Home Care: Wash your hands often! Do not wear your contacts until you complete your treatment plan. Avoid sharing towels, bed linen, personal items with a person who has pink eye. See attention for anyone in your home with similar symptoms.  Get Help Right Away If: Your symptoms do not improve. You develop blurred or loss of vision. Your symptoms worsen (increased discharge, pain or redness)   Thank you for choosing an e-visit.  Your e-visit answers were reviewed by a board certified advanced  clinical practitioner to complete your personal care plan. Depending upon the condition, your plan could have included both over the counter or prescription medications.  Please review your pharmacy choice. Make sure the pharmacy is open so you can pick up prescription now. If there is a problem, you may contact your provider through [employer] and have the prescription routed to another pharmacy.  Your safety is important to [place]. If you have drug allergies check your prescription carefully.   For the next 24 hours you can use MyChart to ask questions about today's visit, request a non-urgent call back, or ask for a work or school excuse. You will get an email in the next two days asking about your experience. I hope that your e-visit has been valuable and will speed your recovery.

## 2020-11-23 ENCOUNTER — Other Ambulatory Visit: Payer: Self-pay | Admitting: Gastroenterology

## 2020-11-23 ENCOUNTER — Telehealth: Payer: BC Managed Care – PPO | Admitting: Nurse Practitioner

## 2020-11-23 DIAGNOSIS — R1011 Right upper quadrant pain: Secondary | ICD-10-CM

## 2020-11-23 DIAGNOSIS — R112 Nausea with vomiting, unspecified: Secondary | ICD-10-CM

## 2020-11-23 DIAGNOSIS — R110 Nausea: Secondary | ICD-10-CM

## 2020-11-23 DIAGNOSIS — B9689 Other specified bacterial agents as the cause of diseases classified elsewhere: Secondary | ICD-10-CM

## 2020-11-24 MED ORDER — METRONIDAZOLE 500 MG PO TABS: 500.0000 mg | ORAL_TABLET | Freq: Two times a day (BID) | ORAL | 0 refills | Status: DC

## 2020-11-24 NOTE — Progress Notes (Signed)
E-Visit for Vaginal Symptoms ? ?We are sorry that you are not feeling well. Here is how we plan to help! ?Based on what you shared with me it looks like you: May have a vaginosis due to bacteria ? ?Vaginosis is an inflammation of the vagina that can result in discharge, itching and pain. The cause is usually a change in the normal balance of vaginal bacteria or an infection. Vaginosis can also result from reduced estrogen levels after menopause. ? ?The most common causes of vaginosis are: ? ? Bacterial vaginosis which results from an overgrowth of one on several organisms that are normally present in your vagina. ? ? Yeast infections which are caused by a naturally occurring fungus called candida. ? ? Vaginal atrophy (atrophic vaginosis) which results from the thinning of the vagina from reduced estrogen levels after menopause. ? ? Trichomoniasis which is caused by a parasite and is commonly transmitted by sexual intercourse. ? ?Factors that increase your risk of developing vaginosis include: ?Medications, such as antibiotics and steroids ?Uncontrolled diabetes ?Use of hygiene products such as bubble bath, vaginal spray or vaginal deodorant ?Douching ?Wearing damp or tight-fitting clothing ?Using an intrauterine device (IUD) for birth control ?Hormonal changes, such as those associated with pregnancy, birth control pills or menopause ?Sexual activity ?Having a sexually transmitted infection ? ?Your treatment plan is Metronidazole or Flagyl 500mg twice a day for 7 days.  I have electronically sent this prescription into the pharmacy that you have chosen. ? ?Be sure to take all of the medication as directed. Stop taking any medication if you develop a rash, tongue swelling or shortness of breath. Mothers who are breast feeding should consider pumping and discarding their breast milk while on these antibiotics. However, there is no consensus that infant exposure at these doses would be harmful.  ?Remember that  medication creams can weaken latex condoms. ?. ? ? ?HOME CARE: ? ?Good hygiene may prevent some types of vaginosis from recurring and may relieve some symptoms: ? ?Avoid baths, hot tubs and whirlpool spas. Rinse soap from your outer genital area after a shower, and dry the area well to prevent irritation. Don't use scented or harsh soaps, such as those with deodorant or antibacterial action. ?Avoid irritants. These include scented tampons and pads. ?Wipe from front to back after using the toilet. Doing so avoids spreading fecal bacteria to your vagina. ? ?Other things that may help prevent vaginosis include: ? ?Don't douche. Your vagina doesn't require cleansing other than normal bathing. Repetitive douching disrupts the normal organisms that reside in the vagina and can actually increase your risk of vaginal infection. Douching won't clear up a vaginal infection. ?Use a latex condom. Both female and female latex condoms may help you avoid infections spread by sexual contact. ?Wear cotton underwear. Also wear pantyhose with a cotton crotch. If you feel comfortable without it, skip wearing underwear to bed. Yeast thrives in moist environments ?Your symptoms should improve in the next day or two. ? ?GET HELP RIGHT AWAY IF: ? ?You have pain in your lower abdomen ( pelvic area or over your ovaries) ?You develop nausea or vomiting ?You develop a fever ?Your discharge changes or worsens ?You have persistent pain with intercourse ?You develop shortness of breath, a rapid pulse, or you faint. ? ?These symptoms could be signs of problems or infections that need to be evaluated by a medical provider now. ? ?MAKE SURE YOU  ? ?Understand these instructions. ?Will watch your condition. ?Will get help right   away if you are not doing well or get worse. ? ?Thank you for choosing an e-visit. ? ?Your e-visit answers were reviewed by a board certified advanced clinical practitioner to complete your personal care plan. Depending upon the  condition, your plan could have included both over the counter or prescription medications. ? ?Please review your pharmacy choice. Make sure the pharmacy is open so you can pick up prescription now. If there is a problem, you may contact your provider through MyChart messaging and have the prescription routed to another pharmacy.  Your safety is important to us. If you have drug allergies check your prescription carefully.  ? ?For the next 24 hours you can use MyChart to ask questions about today's visit, request a non-urgent call back, or ask for a work or school excuse. ?You will get an email in the next two days asking about your experience. I hope that your e-visit has been valuable and will speed your recovery. ? ?5-10 minutes spent reviewing and documenting in chart. ? ?

## 2020-11-24 NOTE — Addendum Note (Signed)
Addended by: Jannifer Rodney A on: 11/24/2020 07:40 PM   Modules accepted: Level of Service

## 2020-12-02 ENCOUNTER — Ambulatory Visit: Payer: BC Managed Care – PPO | Admitting: Nurse Practitioner

## 2020-12-02 ENCOUNTER — Other Ambulatory Visit: Payer: Self-pay

## 2020-12-02 VITALS — BP 102/78 | HR 98 | Temp 98.2°F | Resp 16

## 2020-12-02 DIAGNOSIS — H9201 Otalgia, right ear: Secondary | ICD-10-CM

## 2020-12-04 ENCOUNTER — Encounter: Payer: Self-pay | Admitting: Nurse Practitioner

## 2020-12-04 NOTE — Progress Notes (Signed)
  Subjective:     Patient ID: Angela Knox, female   DOB: 1981-03-06, 40 y.o.   MRN: [id number]  HPI 40 year old female presenting to clinic with right ear pain. She had swimmers ear in April. Has been swimming in private pool throughout the summer.   Denies current nasal congestion or other URI symptoms  Has taken ibuprofen for relief   Review of Systems  Constitutional: Negative.   HENT:  Positive for ear pain.   Respiratory: Negative.    Cardiovascular: Negative.   Neurological: Negative.       Objective:   Physical Exam Constitutional:      Appearance: Normal appearance.  HENT:     Head: Normocephalic.     Right Ear: Tympanic membrane, ear canal and external ear normal. No tenderness. No mastoid tenderness.     Left Ear: Tympanic membrane, ear canal and external ear normal.     Nose: Nose normal.     Mouth/Throat:     Mouth: Mucous membranes are moist.  Eyes:     Pupils: Pupils are equal, round, and reactive to light.  Musculoskeletal:     Cervical back: Normal range of motion.  Neurological:     Mental Status: She is alert.       Assessment:     1. Ear pain, referred, right Exam normal, advised patient to return to clinic with any new concerns. At time of office visit no pain.        Plan:     Return to clinic as needed

## 2021-01-21 DIAGNOSIS — F32A Depression, unspecified: Secondary | ICD-10-CM | POA: Diagnosis not present

## 2021-01-25 ENCOUNTER — Other Ambulatory Visit: Payer: Self-pay | Admitting: Certified Nurse Midwife

## 2021-01-25 DIAGNOSIS — Z23 Encounter for immunization: Secondary | ICD-10-CM | POA: Diagnosis not present

## 2021-01-25 MED ORDER — NORETHIN ACE-ETH ESTRAD-FE 1-20 MG-MCG PO TABS: 1.0000 | ORAL_TABLET | Freq: Every day | ORAL | 11 refills | Status: DC

## 2021-02-07 DIAGNOSIS — J069 Acute upper respiratory infection, unspecified: Secondary | ICD-10-CM | POA: Diagnosis not present

## 2021-02-14 ENCOUNTER — Other Ambulatory Visit: Payer: Self-pay

## 2021-02-14 ENCOUNTER — Ambulatory Visit: Payer: BC Managed Care – PPO | Admitting: Medical

## 2021-02-14 ENCOUNTER — Encounter: Payer: Self-pay | Admitting: Medical

## 2021-02-14 VITALS — BP 110/72 | HR 86 | Temp 97.9°F | Resp 16

## 2021-02-14 DIAGNOSIS — H6993 Unspecified Eustachian tube disorder, bilateral: Secondary | ICD-10-CM

## 2021-02-14 DIAGNOSIS — H6983 Other specified disorders of Eustachian tube, bilateral: Secondary | ICD-10-CM

## 2021-02-14 MED ORDER — PREDNISONE 10 MG (21) PO TBPK: ORAL_TABLET | ORAL | 0 refills | Status: DC

## 2021-02-14 NOTE — Patient Instructions (Signed)
Eustachian Tube Dysfunction °Eustachian tube dysfunction refers to a condition in which a blockage develops in the narrow passage that connects the middle ear to the back of the nose (eustachian tube). The eustachian tube regulates air pressure in the middle ear by letting air move between the ear and nose. It also helps to drain fluid from the middle ear space. °Eustachian tube dysfunction can affect one or both ears. When the eustachian tube does not function properly, air pressure, fluid, or both can build up in the middle ear. °What are the causes? °This condition occurs when the eustachian tube becomes blocked or cannot open normally. Common causes of this condition include: °Ear infections. °Colds and other infections that affect the nose, mouth, and throat (upper respiratory tract). °Allergies. °Irritation from cigarette smoke. °Irritation from stomach acid coming up into the esophagus (gastroesophageal reflux). The esophagus is the part of the body that moves food from the mouth to the stomach. °Sudden changes in air pressure, such as from descending in an airplane or scuba diving. °Abnormal growths in the nose or throat, such as: °Growths that line the nose (nasal polyps). °Abnormal growth of cells (tumors). °Enlarged tissue at the back of the throat (adenoids). °What increases the risk? °You are more likely to develop this condition if: °You smoke. °You are overweight. °You are a child who has: °Certain birth defects of the mouth, such as cleft palate. °Large tonsils or adenoids. °What are the signs or symptoms? °Common symptoms of this condition include: °A feeling of fullness in the ear. °Ear pain. °Clicking or popping noises in the ear. °Ringing in the ear (tinnitus). °Hearing loss. °Loss of balance. °Dizziness. °Symptoms may get worse when the air pressure around you changes, such as when you travel to an area of high elevation, fly on an airplane, or go scuba diving. °How is this diagnosed? °This  condition may be diagnosed based on: °Your symptoms. °A physical exam of your ears, nose, and throat. °Tests, such as those that measure: °The movement of your eardrum. °Your hearing (audiometry). °How is this treated? °Treatment depends on the cause and severity of your condition. °In mild cases, you may relieve your symptoms by moving air into your ears. This is called "popping the ears." °In more severe cases, or if you have symptoms of fluid in your ears, treatment may include: °Medicines to relieve congestion (decongestants). °Medicines that treat allergies (antihistamines). °Nasal sprays or ear drops that contain medicines that reduce swelling (steroids). °A procedure to drain the fluid in your eardrum. In this procedure, a small tube may be placed in the eardrum to: °Drain the fluid. °Restore the air in the middle ear space. °A procedure to insert a balloon device through the nose to inflate the opening of the eustachian tube (balloon dilation). °Follow these instructions at home: °Lifestyle °Do not do any of the following until your health care provider approves: °Travel to high altitudes. °Fly in airplanes. °Work in a pressurized cabin or room. °Scuba dive. °Do not use any products that contain nicotine or tobacco. These products include cigarettes, chewing tobacco, and vaping devices, such as e-cigarettes. If you need help quitting, ask your health care provider. °Keep your ears dry. Wear fitted earplugs during showering and bathing. Dry your ears completely after. °General instructions °Take over-the-counter and prescription medicines only as told by your health care provider. °Use techniques to help pop your ears as recommended by your health care provider. These may include: °Chewing gum. °Yawning. °Frequent, forceful swallowing. °Closing   your mouth, holding your nose closed, and gently blowing as if you are trying to blow air out of your nose. °Keep all follow-up visits. This is important. °Contact a  health care provider if: °Your symptoms do not go away after treatment. °Your symptoms come back after treatment. °You are unable to pop your ears. °You have: °A fever. °Pain in your ear. °Pain in your head or neck. °Fluid draining from your ear. °Your hearing suddenly changes. °You become very dizzy. °You lose your balance. °Get help right away if: °You have a sudden, severe increase in any of your symptoms. °Summary °Eustachian tube dysfunction refers to a condition in which a blockage develops in the eustachian tube. °It can be caused by ear infections, allergies, inhaled irritants, or abnormal growths in the nose or throat. °Symptoms may include ear pain or fullness, hearing loss, or ringing in the ears. °Mild cases are treated with techniques to unblock the ears, such as yawning or chewing gum. °More severe cases are treated with medicines or procedures. °This information is not intended to replace advice given to you by your health care provider. Make sure you discuss any questions you have with your health care provider. °Document Revised: 06/21/2020 Document Reviewed: 06/21/2020 °Elsevier Patient Education © 2022 [employer] ° °

## 2021-02-14 NOTE — Progress Notes (Signed)
Subjective:    Patient ID: Angela Knox, female    DOB: 1980/08/18, 40 y.o.   MRN: [id number]  HPI 40 yo female in non acute distress presents today with ears feeling full and   Started 8 days ago, treated with a Zpak by PCP foar sinusitis.   Nose discharge thick and clear, no cough, no fever or chills.No shortness of breath.  Blood pressure 110/72, pulse 86, temperature 97.9 F (36.6 C), temperature source Oral, resp. rate 16, SpO2 99 %.   Allergies  Allergen Reactions   Amoxicillin     Nausea and vomiting on 03/15/18   Ciprofloxacin Other (See Comments)    Body aches   Asa [Aspirin] Itching and Rash        Review of Systems  Constitutional:  Negative for chills and fever.  HENT:  Positive for congestion, postnasal drip and rhinorrhea. Negative for sinus pressure, sinus pain, sneezing, sore throat and trouble swallowing.   Respiratory:  Negative for cough and shortness of breath.   Cardiovascular:  Negative for chest pain.  Gastrointestinal:  Negative for abdominal pain and diarrhea.  Musculoskeletal:  Negative for myalgias.  Allergic/Immunologic: Positive for environmental allergies.  Neurological:  Positive for dizziness. Negative for syncope and headaches.     Taking Flonase and Zyrtec and Singulair Objective:   Physical Exam Constitutional:      Appearance: Normal appearance. She is obese.  HENT:     Head: Normocephalic and atraumatic.     Right Ear: Hearing, ear canal and external ear normal. A middle ear effusion is present.     Left Ear: Hearing, ear canal and external ear normal. A middle ear effusion is present.     Nose: Congestion present.     Mouth/Throat:     Mouth: Mucous membranes are moist.     Pharynx: Oropharynx is clear. No pharyngeal swelling, oropharyngeal exudate, posterior oropharyngeal erythema or uvula swelling.     Tonsils: No tonsillar exudate or tonsillar abscesses.  Eyes:     Extraocular Movements: Extraocular movements intact.      Conjunctiva/sclera: Conjunctivae normal.     Pupils: Pupils are equal, round, and reactive to light.  Cardiovascular:     Rate and Rhythm: Normal rate and regular rhythm.     Heart sounds: Normal heart sounds.  Pulmonary:     Effort: Pulmonary effort is normal.     Breath sounds: Normal breath sounds.  Musculoskeletal:        General: Normal range of motion.     Cervical back: Normal range of motion and neck supple.  Lymphadenopathy:     Cervical: No cervical adenopathy.  Skin:    General: Skin is warm and dry.     Capillary Refill: Capillary refill takes less than 2 seconds.  Neurological:     General: No focal deficit present.     Mental Status: She is alert and oriented to person, place, and time.  Psychiatric:        Mood and Affect: Mood normal.        Behavior: Behavior normal.        Thought Content: Thought content normal.        Judgment: Judgment normal.          Assessment & Plan:  Eustachian tube dysfunction bilaterally Finished  Z pak today continue other medications. Add in Vit C and Zinc. Meds ordered this encounter  Medications   predniSONE (STERAPRED UNI-PAK 21 TAB) 10 MG (21) TBPK tablet  Sig: Take  6 tablets by mouth today then 5 tablets tomorrow then one less every day there after, take with food.    Dispense:  21 tablet    Refill:  0    If pain occurs in ears please contact the office. Patient verbalizes understanding and has no questions at discharge.

## 2021-03-04 ENCOUNTER — Other Ambulatory Visit: Payer: Self-pay

## 2021-03-04 ENCOUNTER — Ambulatory Visit: Payer: BC Managed Care – PPO | Admitting: Nurse Practitioner

## 2021-03-04 DIAGNOSIS — H1033 Unspecified acute conjunctivitis, bilateral: Secondary | ICD-10-CM

## 2021-03-04 MED ORDER — GENTAMICIN SULFATE 0.3 % OP SOLN: 1.0000 [drp] | OPHTHALMIC | 0 refills | Status: AC

## 2021-03-04 NOTE — Progress Notes (Signed)
   Subjective:    Patient ID: Angela Knox, female    DOB: October 08, 1980, 40 y.o.   MRN: [id number]  HPI  40 year old female presenting to [facility] with complaints of acute onset of left eye redness this morning. Her eye has felt irritated without drainage. She does wear contacts, monthly disposable- most recently changed 10 days ago.   Denies nasal congestion or other allergic or URI symptoms    Review of Systems  Constitutional: Negative.   HENT: Negative.    Eyes:  Positive for redness.  Respiratory: Negative.    Cardiovascular: Negative.   Gastrointestinal: Negative.   Genitourinary: Negative.   Musculoskeletal: Negative.   Neurological: Negative.   Hematological: Negative.       Objective:   Physical Exam Constitutional:      Appearance: Normal appearance.  HENT:     Head: Normocephalic.  Eyes:     General: Lids are normal.     Extraocular Movements: Extraocular movements intact.     Conjunctiva/sclera:     Right eye: Right conjunctiva is injected. No exudate.    Left eye: Left conjunctiva is injected. No exudate.    Pupils: Pupils are equal, round, and reactive to light.  Neurological:     General: No focal deficit present.     Mental Status: She is alert.  Psychiatric:        Mood and Affect: Mood normal.          Assessment & Plan:  1. Acute conjunctivitis of both eyes, unspecified acute conjunctivitis type  - gentamicin (GARAMYCIN) 0.3 % ophthalmic solution; Place 1 drop into both eyes every 4 (four) hours while awake for 5 days.  Dispense: 5 mL; Refill: 0    Advised artificial tears for added support. OTC allergy medication  RTC if symptoms persist or with new concerns

## 2021-03-24 DIAGNOSIS — L858 Other specified epidermal thickening: Secondary | ICD-10-CM | POA: Diagnosis not present

## 2021-03-24 DIAGNOSIS — D2271 Melanocytic nevi of right lower limb, including hip: Secondary | ICD-10-CM | POA: Diagnosis not present

## 2021-03-24 DIAGNOSIS — D2272 Melanocytic nevi of left lower limb, including hip: Secondary | ICD-10-CM | POA: Diagnosis not present

## 2021-03-24 DIAGNOSIS — D2262 Melanocytic nevi of left upper limb, including shoulder: Secondary | ICD-10-CM | POA: Diagnosis not present

## 2021-04-04 ENCOUNTER — Ambulatory Visit: Payer: BC Managed Care – PPO | Admitting: Medical

## 2021-04-04 ENCOUNTER — Encounter: Payer: Self-pay | Admitting: Medical

## 2021-04-04 ENCOUNTER — Other Ambulatory Visit: Payer: Self-pay

## 2021-04-04 VITALS — BP 102/70 | HR 91 | Temp 97.6°F | Resp 16

## 2021-04-04 DIAGNOSIS — H6983 Other specified disorders of Eustachian tube, bilateral: Secondary | ICD-10-CM

## 2021-04-04 DIAGNOSIS — H6503 Acute serous otitis media, bilateral: Secondary | ICD-10-CM

## 2021-04-04 DIAGNOSIS — H6993 Unspecified Eustachian tube disorder, bilateral: Secondary | ICD-10-CM

## 2021-04-04 MED ORDER — PREDNISONE 10 MG (21) PO TBPK: ORAL_TABLET | ORAL | 0 refills | Status: DC

## 2021-04-04 MED ORDER — AZITHROMYCIN 250 MG PO TABS: ORAL_TABLET | ORAL | 0 refills | Status: AC

## 2021-04-04 NOTE — Progress Notes (Signed)
   Subjective:    Patient ID: Angela Knox, female    DOB: [birth year], 40 y.o.   MRN: [id number]  HPI 40 yo female in non acute distress, presents today with bilateral ear pain. Presents today with ear pain since Friday. History of laryngitis on Friday, now better. Denies fever , no chills or body aches , fever. Constant pain.No discharge out of ears. Uses Flonase and Singular daily. Taking Zyrtec daily.  Toddler sick with a cold.  Blood pressure 102/70, pulse 91, temperature 97.6 F (36.4 C), temperature source Tympanic, resp. rate 16, last menstrual period 03/17/2021, SpO2 98 %.   Allergies  Allergen Reactions   Amoxicillin     Nausea and vomiting on 03/15/18   Ciprofloxacin Other (See Comments)    Body aches   Asa [Aspirin] Itching and Rash    Review of Systems  HENT:  Positive for ear discharge (none) and ear pain (R>L). Negative for postnasal drip, sinus pressure, sinus pain, sneezing and sore throat.   Respiratory:  Positive for cough (occasionally). Negative for shortness of breath and wheezing.   Cardiovascular:  Negative for chest pain.  Gastrointestinal:  Negative for abdominal pain, nausea, rectal pain and vomiting.  Genitourinary:  Negative for difficulty urinating.  Musculoskeletal:  Negative for myalgias.  Allergic/Immunologic: Positive for environmental allergies. Negative for food allergies.  Neurological:  Negative for dizziness (mild), syncope, light-headedness and headaches.      Objective:   Physical Exam Vitals and nursing note reviewed.  Constitutional:      Appearance: Normal appearance.  HENT:     Head: Normocephalic and atraumatic.     Right Ear: Ear canal and external ear normal.     Left Ear: Ear canal and external ear normal.  Eyes:     Extraocular Movements: Extraocular movements intact.     Conjunctiva/sclera: Conjunctivae normal.     Pupils: Pupils are equal, round, and reactive to light.  Pulmonary:     Effort: Pulmonary effort is  normal.  Musculoskeletal:        General: Normal range of motion.  Skin:    General: Skin is warm and dry.     Capillary Refill: Capillary refill takes less than 2 seconds.  Neurological:     General: No focal deficit present.     Mental Status: She is alert and oriented to person, place, and time.  Psychiatric:        Mood and Affect: Mood normal.        Behavior: Behavior normal.        Thought Content: Thought content normal.        Judgment: Judgment normal.          Assessment & Plan:  Otitis media serous bilateral Eustachian tube dysfunction Meds ordered this encounter  Medications   azithromycin (ZITHROMAX) 250 MG tablet    Sig: Take 2 tablets on day 1, then 1 tablet daily on days 2 through 5    Dispense:  6 tablet    Refill:  0   Follow up in 3-5 days if not improving here or at your PCP. Patient verbalizes understanding and has no questions at discharge.

## 2021-04-04 NOTE — Patient Instructions (Signed)
Eustachian Tube Dysfunction °Eustachian tube dysfunction refers to a condition in which a blockage develops in the narrow passage that connects the middle ear to the back of the nose (eustachian tube). The eustachian tube regulates air pressure in the middle ear by letting air move between the ear and nose. It also helps to drain fluid from the middle ear space. °Eustachian tube dysfunction can affect one or both ears. When the eustachian tube does not function properly, air pressure, fluid, or both can build up in the middle ear. °What are the causes? °This condition occurs when the eustachian tube becomes blocked or cannot open normally. Common causes of this condition include: °Ear infections. °Colds and other infections that affect the nose, mouth, and throat (upper respiratory tract). °Allergies. °Irritation from cigarette smoke. °Irritation from stomach acid coming up into the esophagus (gastroesophageal reflux). The esophagus is the part of the body that moves food from the mouth to the stomach. °Sudden changes in air pressure, such as from descending in an airplane or scuba diving. °Abnormal growths in the nose or throat, such as: °Growths that line the nose (nasal polyps). °Abnormal growth of cells (tumors). °Enlarged tissue at the back of the throat (adenoids). °What increases the risk? °You are more likely to develop this condition if: °You smoke. °You are overweight. °You are a child who has: °Certain birth defects of the mouth, such as cleft palate. °Large tonsils or adenoids. °What are the signs or symptoms? °Common symptoms of this condition include: °A feeling of fullness in the ear. °Ear pain. °Clicking or popping noises in the ear. °Ringing in the ear (tinnitus). °Hearing loss. °Loss of balance. °Dizziness. °Symptoms may get worse when the air pressure around you changes, such as when you travel to an area of high elevation, fly on an airplane, or go scuba diving. °How is this diagnosed? °This  condition may be diagnosed based on: °Your symptoms. °A physical exam of your ears, nose, and throat. °Tests, such as those that measure: °The movement of your eardrum. °Your hearing (audiometry). °How is this treated? °Treatment depends on the cause and severity of your condition. °In mild cases, you may relieve your symptoms by moving air into your ears. This is called "popping the ears." °In more severe cases, or if you have symptoms of fluid in your ears, treatment may include: °Medicines to relieve congestion (decongestants). °Medicines that treat allergies (antihistamines). °Nasal sprays or ear drops that contain medicines that reduce swelling (steroids). °A procedure to drain the fluid in your eardrum. In this procedure, a small tube may be placed in the eardrum to: °Drain the fluid. °Restore the air in the middle ear space. °A procedure to insert a balloon device through the nose to inflate the opening of the eustachian tube (balloon dilation). °Follow these instructions at home: °Lifestyle °Do not do any of the following until your health care provider approves: °Travel to high altitudes. °Fly in airplanes. °Work in a pressurized cabin or room. °Scuba dive. °Do not use any products that contain nicotine or tobacco. These products include cigarettes, chewing tobacco, and vaping devices, such as e-cigarettes. If you need help quitting, ask your health care provider. °Keep your ears dry. Wear fitted earplugs during showering and bathing. Dry your ears completely after. °General instructions °Take over-the-counter and prescription medicines only as told by your health care provider. °Use techniques to help pop your ears as recommended by your health care provider. These may include: °Chewing gum. °Yawning. °Frequent, forceful swallowing. °Closing   your mouth, holding your nose closed, and gently blowing as if you are trying to blow air out of your nose. °Keep all follow-up visits. This is important. °Contact a  health care provider if: °Your symptoms do not go away after treatment. °Your symptoms come back after treatment. °You are unable to pop your ears. °You have: °A fever. °Pain in your ear. °Pain in your head or neck. °Fluid draining from your ear. °Your hearing suddenly changes. °You become very dizzy. °You lose your balance. °Get help right away if: °You have a sudden, severe increase in any of your symptoms. °Summary °Eustachian tube dysfunction refers to a condition in which a blockage develops in the eustachian tube. °It can be caused by ear infections, allergies, inhaled irritants, or abnormal growths in the nose or throat. °Symptoms may include ear pain or fullness, hearing loss, or ringing in the ears. °Mild cases are treated with techniques to unblock the ears, such as yawning or chewing gum. °More severe cases are treated with medicines or procedures. °This information is not intended to replace advice given to you by your health care provider. Make sure you discuss any questions you have with your health care provider. °Document Revised: 06/21/2020 Document Reviewed: 06/21/2020 °Elsevier Patient Education © 2022 [employer] °Otitis Media, Adult °Otitis media occurs when there is inflammation and fluid in the middle ear with signs and symptoms of an acute infection. The middle ear is a part of the ear that contains bones for hearing as well as air that helps send sounds to the brain. When infected fluid builds up in this space, it causes pressure and can lead to an ear infection. The eustachian tube connects the middle ear to the back of the nose (nasopharynx) and normally allows air into the middle ear. If the eustachian tube becomes blocked, fluid can build up and become infected. °What are the causes? °This condition is caused by a blockage in the eustachian tube. This can be caused by mucus or by swelling of the tube. Problems that can cause a blockage include: °A cold or other upper respiratory  infection. °Allergies. °An irritant, such as tobacco smoke. °Enlarged adenoids. The adenoids are areas of soft tissue located high in the back of the throat, behind the nose and the roof of the mouth. They are part of the body's defense system (immune system). °A mass in the nasopharynx. °Damage to the ear caused by pressure changes (barotrauma). °What increases the risk? °You are more likely to develop this condition if you: °Smoke or are exposed to tobacco smoke. °Have an opening in the roof of your mouth (cleft palate). °Have gastroesophageal reflux. °Have an immune system disorder. °What are the signs or symptoms? °Symptoms of this condition include: °Ear pain. °Fever. °Decreased hearing. °Tiredness (lethargy). °Fluid leaking from the ear, if the eardrum is ruptured or has burst. °Ringing in the ear. °How is this diagnosed? °This condition is diagnosed with a physical exam. During the exam, your health care provider will use an instrument called an otoscope to look in your ear and check for redness, swelling, and fluid. He or she will also ask about your symptoms. °Your health care provider may also order tests, such as: °A pneumatic otoscopy. This is a test to check the movement of the eardrum. It is done by squeezing a small amount of air into the ear. °A tympanogram. This is a test that shows how well the eardrum moves in response to air pressure in the ear   canal. It provides a graph for your health care provider to review. °How is this treated? °This condition can go away on its own within 3-5 days. But if the condition is caused by a bacterial infection and does not go away on its own, or if it keeps coming back, your health care provider may: °Prescribe antibiotic medicine to treat the infection. °Prescribe or recommend medicines to control pain. °Follow these instructions at home: °Take over-the-counter and prescription medicines only as told by your health care provider. °If you were prescribed an  antibiotic medicine, take it as told by your health care provider. Do not stop taking the antibiotic even if you start to feel better. °Keep all follow-up visits. This is important. °Contact a health care provider if: °You have bleeding from your nose. °There is a lump on your neck. °You are not feeling better in 5 days. °You feel worse instead of better. °Get help right away if: °You have severe pain that is not controlled with medicine. °You have swelling, redness, or pain around your ear. °You have stiffness in your neck. °A part of your face is not moving (paralyzed). °The bone behind your ear (mastoid bone) is tender when you touch it. °You develop a severe headache. °Summary °Otitis media is redness, soreness, and swelling of the middle ear, usually resulting in pain and decreased hearing. °This condition can go away on its own within 3-5 days. °If the problem does not go away in 3-5 days, your health care provider may give you medicines to treat the infection. °If you were prescribed an antibiotic medicine, take it as told by your health care provider. °Follow all instructions that were given to you by your health care provider. °This information is not intended to replace advice given to you by your health care provider. Make sure you discuss any questions you have with your health care provider. °Document Revised: 07/19/2020 Document Reviewed: 07/19/2020 °Elsevier Patient Education © 2022 Elsevier Inc. ° °

## 2021-04-08 ENCOUNTER — Encounter: Payer: Self-pay | Admitting: Nurse Practitioner

## 2021-04-08 ENCOUNTER — Ambulatory Visit: Payer: BC Managed Care – PPO | Admitting: Nurse Practitioner

## 2021-04-08 ENCOUNTER — Other Ambulatory Visit: Payer: Self-pay

## 2021-04-08 VITALS — BP 110/80 | HR 85 | Temp 98.5°F | Resp 16

## 2021-04-08 DIAGNOSIS — H6503 Acute serous otitis media, bilateral: Secondary | ICD-10-CM

## 2021-04-08 NOTE — Progress Notes (Signed)
° °  Subjective:    Patient ID: Angela Knox, female    DOB: 1980/05/01, 40 y.o.   MRN: [id number]  HPI  40 year old female returning to [facility] with complaints of ongoing ear pain. She was seen 04/04/21 and started on Azithromycin and prednisone for AOM.   She has continued to use  flonase daily is not using a decongestant.     Past Medical History:  Diagnosis Date   Anxiety    Depression    Insomnia    Tachycardia       Review of Systems  Constitutional: Negative.   HENT:  Positive for congestion and ear pain.   Respiratory: Negative.    Cardiovascular: Negative.   Gastrointestinal: Negative.   Genitourinary: Negative.   Musculoskeletal: Negative.   Neurological: Negative.   Hematological: Negative.   Psychiatric/Behavioral: Negative.        Objective:   Physical Exam HENT:     Head: Normocephalic.     Right Ear: Hearing, ear canal and external ear normal. A middle ear effusion is present.     Left Ear: Hearing, ear canal and external ear normal. A middle ear effusion is present.     Ears:     Comments: Fluid clear no inflammation or erythema noted     Nose: Nose normal.  Eyes:     Pupils: Pupils are equal, round, and reactive to light.  Skin:    General: Skin is warm.  Neurological:     General: No focal deficit present.     Mental Status: She is alert.  Psychiatric:        Mood and Affect: Mood normal.          Assessment & Plan:  1. Non-recurrent acute serous otitis media of both ears Advised starting decongestant (sudafed) 4 hour, continue flonase, may transition to ibuprofen from prednisone for pain and inflammation relief.  RTC if pain pressure persists for recheck as discussed

## 2021-05-19 ENCOUNTER — Telehealth (HOSPITAL_COMMUNITY): Payer: Self-pay | Admitting: *Deleted

## 2021-05-19 NOTE — Telephone Encounter (Signed)
Writer left VM for pt explaining that the office is calling pts who've not been seen in a year or so. Message left encouraging pt to call and let us know if she would like appointment or to be closed out.

## 2021-05-30 ENCOUNTER — Encounter: Payer: BC Managed Care – PPO | Admitting: Certified Nurse Midwife

## 2021-08-04 ENCOUNTER — Encounter: Payer: Self-pay | Admitting: Nurse Practitioner

## 2021-08-04 ENCOUNTER — Ambulatory Visit: Payer: BC Managed Care – PPO | Admitting: Nurse Practitioner

## 2021-08-04 ENCOUNTER — Other Ambulatory Visit: Payer: Self-pay | Admitting: Nurse Practitioner

## 2021-08-04 DIAGNOSIS — R300 Dysuria: Secondary | ICD-10-CM

## 2021-08-04 DIAGNOSIS — N3090 Cystitis, unspecified without hematuria: Secondary | ICD-10-CM

## 2021-08-04 MED ORDER — SULFAMETHOXAZOLE-TRIMETHOPRIM 800-160 MG PO TABS: 1.0000 | ORAL_TABLET | Freq: Two times a day (BID) | ORAL | 0 refills | Status: AC

## 2021-08-04 MED ORDER — NITROFURANTOIN MONOHYD MACRO 100 MG PO CAPS: 100.0000 mg | ORAL_CAPSULE | Freq: Two times a day (BID) | ORAL | 0 refills | Status: AC

## 2021-08-04 NOTE — Progress Notes (Signed)
See previous note

## 2021-08-04 NOTE — Progress Notes (Signed)
?Virtual Visit Consent  ? ?Lennart Pall, you are scheduled for a virtual visit with a [facility] provider today.   ?  ?Just as with appointments in the office, your consent must be obtained to participate.  Your consent will be active for this visit and any virtual visit you may have with one of our providers in the next 365 days.   ? ?I need to obtain your verbal consent now.   Are you willing to proceed with your visit today?  ?  ?Angela Knox has provided verbal consent on 08/04/2021 for a virtual visit (video or telephone). ?  ?Viviano Simas, FNP  ? ?Date: 08/04/2021 3:43 PM ? ? ?Virtual Visit via Video Note  ? ?IViviano Simas, connected with  Angela Knox  ([id number], 08-23-1980) on 08/04/21 at  by a video-enabled telemedicine application and verified that I am speaking with the correct person using two identifiers. ? ?Location: ?Patient: Virtual Visit Location Patient: Home ?Provider: Virtual Visit Location Provider: Office/Clinic ?  ?I discussed the limitations of evaluation and management by telemedicine and the availability of in person appointments. The patient expressed understanding and agreed to proceed.   ? ?History of Present Illness: ?Angela Knox is a 41 y.o. who identifies as a female who was assigned female at birth, and is being seen today urinary frequency urgency and pain after urination. Some cramping, no back pain. Possibly low grade fever last night.  ? ?Most recent UTI in January had mixed flora was started on Macrobid and switched to Cefdinir that she remembers one made her sick. She is allergic to Amoxicillin. Also has a history of Cipro allergy.  ? ?Problems:  ?Patient Active Problem List  ? Diagnosis Date Noted  ? Irregular menstrual cycle 11/10/2020  ? Obesity (BMI 35.0-39.9 without comorbidity) 02/11/2020  ? Chronic migraine without aura without status migrainosus, not intractable 02/11/2020  ? Iron deficiency anemia 07/21/2019  ? Tachycardia 07/21/2019  ? Vitamin D  deficiency 01/31/2018  ? Major depressive disorder, recurrent, in partial remission (HCC)   ? Insomnia   ?  ?Allergies:  ?Allergies  ?Allergen Reactions  ? Amoxicillin   ?  Nausea and vomiting on 03/15/18  ? Ciprofloxacin Other (See Comments)  ?  Body aches  ? Asa [Aspirin] Itching and Rash  ? ?Medications:  ?Current Outpatient Medications:  ?  fluticasone (FLONASE) 50 MCG/ACT nasal spray, Place 2 sprays into both nostrils daily., Disp: 16 g, Rfl: 6 ?  montelukast (SINGULAIR) 10 MG tablet, Take by mouth., Disp: , Rfl:  ?  norethindrone-ethinyl estradiol-FE (LOESTRIN FE) 1-20 MG-MCG tablet, Take 1 tablet by mouth daily., Disp: 30 tablet, Rfl: 11 ?  predniSONE (STERAPRED UNI-PAK 21 TAB) 10 MG (21) TBPK tablet, Take  6 tablets by mouth today then 5 tablets tomorrow then one less every day there after, take with food. (Patient not taking: Reported on 04/04/2021), Disp: 21 tablet, Rfl: 0 ?  predniSONE (STERAPRED UNI-PAK 21 TAB) 10 MG (21) TBPK tablet, Take 6 tablets by mouth today then 5 tablets tomorrow then one less tablet every day there after. Take with food, Disp: 21 tablet, Rfl: 0 ?  SUMAtriptan (IMITREX) 50 MG tablet, TAKE 1 TABLET BY MOUTH ONCE AS NEEDED FOR UP TO 1 DOSE FOR MIGRAINE. MAY REPEAT ONE DOSE IN 2 HOURS IF HEADACHE PERSISTS, FOR MAX DOSE 24 HOURS, Disp: 12 tablet, Rfl: 1 ?  zolpidem (AMBIEN) 10 MG tablet, Take 1 tablet (10 mg total) by mouth at bedtime. for sleep, Disp: 20 tablet,  Rfl: 2 ? ?Observations/Objective: ?No physical performed  ?Patient in no distress during phone conversation with provider  ? ?Assessment and Plan: ?1. Dysuria ?- sulfamethoxazole-trimethoprim (BACTRIM DS) 800-160 MG tablet; Take 1 tablet by mouth 2 (two) times daily for 5 days.  Dispense: 10 tablet; Refill: 0 ?   ? ?Follow Up Instructions: ?I discussed the assessment and treatment plan with the patient. The patient was provided an opportunity to ask questions and all were answered. The patient agreed with the plan and  demonstrated an understanding of the instructions.  A copy of instructions were sent to the patient via MyChart unless otherwise noted below.  ? ?The patient was advised to call back or seek an in-person evaluation if the symptoms worsen or if the condition fails to improve as anticipated. ? ?Time:  ?I spent 10 minutes with the patient via telehealth technology discussing the above problems/concerns.   ? ?Viviano Simas, FNP ? ?

## 2021-08-30 ENCOUNTER — Ambulatory Visit: Payer: BC Managed Care – PPO | Admitting: Medical

## 2021-08-30 ENCOUNTER — Encounter: Payer: Self-pay | Admitting: Medical

## 2021-08-30 VITALS — BP 104/62 | HR 97 | Temp 97.8°F | Resp 16

## 2021-08-30 DIAGNOSIS — R300 Dysuria: Secondary | ICD-10-CM

## 2021-08-30 DIAGNOSIS — R110 Nausea: Secondary | ICD-10-CM

## 2021-08-30 MED ORDER — CEFTRIAXONE SODIUM 1 G IJ SOLR
1.0000 g | Freq: Once | INTRAMUSCULAR | Status: AC
  Administered 2021-08-30: 1 g via INTRAMUSCULAR

## 2021-08-30 MED ORDER — ONDANSETRON HCL 4 MG PO TABS: 4.0000 mg | ORAL_TABLET | Freq: Three times a day (TID) | ORAL | 0 refills | Status: DC | PRN

## 2021-08-30 MED ORDER — CEPHALEXIN 500 MG PO CAPS: 500.0000 mg | ORAL_CAPSULE | Freq: Four times a day (QID) | ORAL | 0 refills | Status: DC

## 2021-08-30 NOTE — Progress Notes (Signed)
? ?Subjective:  ? ? Patient ID: Angela Knox, female    DOB: 29-Dec-1980, 41 y.o.   MRN: [id number] ? ?HPI ?41 yo female in non acute distress presents to day with symptoms of. ?Burning on urination starting today and frequency. ? ?Blood pressure 104/62, pulse 97, temperature 97.8 ?F (36.6 ?C), temperature source Tympanic, resp. rate 16, last menstrual period 08/14/2021, SpO2 98 %. ? ?Allergies  ?Allergen Reactions  ? Amoxicillin   ?  Nausea and vomiting on 03/15/18  ? Ciprofloxacin Other (See Comments)  ?  Body aches  ? Asa [Aspirin] Itching and Rash  ? ?Her reactions with PCN are stomach upset and nausea/vomiting. If these occur with the keflex she should stop the medication and follow up with this clinic. ? ?Had UTI in March. ? ?. ? ? ?Review of Systems  ?Constitutional:  Positive for chills and fatigue.  ?Respiratory:  Negative for cough and shortness of breath.   ?Cardiovascular:  Negative for chest pain.  ?Gastrointestinal:  Positive for abdominal pain and diarrhea. Negative for nausea and vomiting.  ?Genitourinary:  Positive for dysuria and frequency. Negative for decreased urine volume, hematuria, urgency and vaginal bleeding.  ?Musculoskeletal:  Positive for back pain.  ?Allergic/Immunologic: Positive for environmental allergies.  ?Neurological:  Negative for headaches.  ? ?   ?Objective:  ? Physical Exam ?Vitals and nursing note reviewed.  ?Constitutional:   ?   Appearance: Normal appearance.  ?HENT:  ?   Head: Normocephalic and atraumatic.  ?Eyes:  ?   Extraocular Movements: Extraocular movements intact.  ?   Conjunctiva/sclera: Conjunctivae normal.  ?   Pupils: Pupils are equal, round, and reactive to light.  ?Pulmonary:  ?   Effort: Pulmonary effort is normal.  ?Abdominal:  ?   General: Bowel sounds are normal. There is no distension.  ?   Palpations: Abdomen is soft. There is no mass.  ?   Tenderness: There is no abdominal tenderness. There is no right CVA tenderness, left CVA tenderness, guarding or  rebound.  ?   Hernia: No hernia is present.  ?Musculoskeletal:     ?   General: Normal range of motion.  ?   Cervical back: Normal range of motion and neck supple.  ?Skin: ?   General: Skin is warm and dry.  ?   Capillary Refill: Capillary refill takes less than 2 seconds.  ?Neurological:  ?   General: No focal deficit present.  ?   Mental Status: She is alert and oriented to person, place, and time. Mental status is at baseline.  ?Psychiatric:     ?   Mood and Affect: Mood normal.     ?   Behavior: Behavior normal.     ?   Thought Content: Thought content normal.     ?   Judgment: Judgment normal.  ? ?Patient took azo  ?Recent Results (from the past 2160 hour(s))  ?UA/M w/rflx Culture, Routine     Status: Abnormal  ? Collection Time: 08/30/21  3:52 PM  ? Specimen: Urine  ? Urine  ?Result Value Ref Range  ? Specific Gravity, UA 1.015 1.005 - 1.030  ? pH, UA 5.5 5.0 - 7.5  ? Color, UA Yellow Yellow  ? Appearance Ur Clear Clear  ? Leukocytes,UA Trace (A) Negative  ? Protein,UA Negative Negative/Trace  ? Glucose, UA Negative Negative  ? Ketones, UA Negative Negative  ? RBC, UA Negative Negative  ? Bilirubin, UA Negative Negative  ? Urobilinogen, Ur 0.2 0.2 - 1.0  mg/dL  ? Nitrite, UA Positive (A) Negative  ? Microscopic Examination See below:   ?  Comment: Microscopic was indicated and was performed.  ? Urinalysis Reflex Comment   ?  Comment: This specimen has reflexed to a Urine Culture.  ?Microscopic Examination     Status: None  ? Collection Time: 08/30/21  3:52 PM  ? Urine  ?Result Value Ref Range  ? WBC, UA 0-5 0 - 5 /hpf  ? RBC 0-2 0 - 2 /hpf  ? Epithelial Cells (non renal) 0-10 0 - 10 /hpf  ? Casts None seen None seen /lpf  ? Bacteria, UA None seen None seen/Few  ?Urine Culture, Reflex     Status: None  ? Collection Time: 08/30/21  3:52 PM  ? Urine  ?Result Value Ref Range  ? Urine Culture, Routine Final report   ? Organism ID, Bacteria Comment   ?  Comment: Mixed urogenital flora ?25,000-50,000 colony forming  units per mL ?  ?  ? ?   ?Assessment & Plan:  ?Cystitis with hematuria ?Meds ordered this encounter  ?Medications  ? cefTRIAXone (ROCEPHIN) injection 1 g  ? DISCONTD: cephALEXin (KEFLEX) 500 MG capsule  ?  Sig: Take 1 capsule (500 mg total) by mouth 4 (four) times daily. Take with food.  ?  Dispense:  28 capsule  ?  Refill:  0  ? cephALEXin (KEFLEX) 500 MG capsule  ?  Sig: Take 1 capsule (500 mg total) by mouth 4 (four) times daily. Take with food.  ?  Dispense:  28 capsule  ?  Refill:  0  ? ondansetron (ZOFRAN) 4 MG tablet  ?  Sig: Take 1 tablet (4 mg total) by mouth every 8 (eight) hours as needed for nausea or vomiting.  ?  Dispense:  12 tablet  ?  Refill:  0  ? Increase water intake.and avoid AZO till giving a fresh sample of urine. ?Patient verbalizes understanding and has no questions at discharge. ? ?

## 2021-08-30 NOTE — Patient Instructions (Addendum)
Urinary Tract Infection, Adult ?A urinary tract infection (UTI) is an infection of any part of the urinary tract. The urinary tract includes: ?The kidneys. ?The ureters. ?The bladder. ?The urethra. ?These organs make, store, and get rid of pee (urine) in the body. ?What are the causes? ?This infection is caused by germs (bacteria) in your genital area. These germs grow and cause swelling (inflammation) of your urinary tract. ?What increases the risk? ?The following factors may make you more likely to develop this condition: ?Using a small, thin tube (catheter) to drain pee. ?Not being able to control when you pee or poop (incontinence). ?Being female. If you are female, these things can increase the risk: ?Using these methods to prevent pregnancy: ?A medicine that kills sperm (spermicide). ?A device that blocks sperm (diaphragm). ?Having low levels of a female hormone (estrogen). ?Being pregnant. ?You are more likely to develop this condition if: ?You have genes that add to your risk. ?You are sexually active. ?You take antibiotic medicines. ?You have trouble peeing because of: ?A prostate that is bigger than normal, if you are female. ?A blockage in the part of your body that drains pee from the bladder. ?A kidney stone. ?A nerve condition that affects your bladder. ?Not getting enough to drink. ?Not peeing often enough. ?You have other conditions, such as: ?Diabetes. ?A weak disease-fighting system (immune system). ?Sickle cell disease. ?Gout. ?Injury of the spine. ?What are the signs or symptoms? ?Symptoms of this condition include: ?Needing to pee right away. ?Peeing small amounts often. ?Pain or burning when peeing. ?Blood in the pee. ?Pee that smells bad or not like normal. ?Trouble peeing. ?Pee that is cloudy. ?Fluid coming from the vagina, if you are female. ?Pain in the belly or lower back. ?Other symptoms include: ?Vomiting. ?Not feeling hungry. ?Feeling mixed up (confused). This may be the first symptom in  older adults. ?Being tired and grouchy (irritable). ?A fever. ?Watery poop (diarrhea). ?How is this treated? ?Taking antibiotic medicine. ?Taking other medicines. ?Drinking enough water. ?In some cases, you may need to see a specialist. ?Follow these instructions at home: ? ?Medicines ?Take over-the-counter and prescription medicines only as told by your doctor. ?If you were prescribed an antibiotic medicine, take it as told by your doctor. Do not stop taking it even if you start to feel better. ?General instructions ?Make sure you: ?Pee until your bladder is empty. ?Do not hold pee for a long time. ?Empty your bladder after sex. ?Wipe from front to back after peeing or pooping if you are a female. Use each tissue one time when you wipe. ?Drink enough fluid to keep your pee pale yellow. ?Keep all follow-up visits. ?Contact a doctor if: ?You do not get better after 1-2 days. ?Your symptoms go away and then come back. ?Get help right away if: ?You have very bad back pain. ?You have very bad pain in your lower belly. ?You have a fever. ?You have chills. ?You feeling like you will vomit or you vomit. ?Summary ?A urinary tract infection (UTI) is an infection of any part of the urinary tract. ?This condition is caused by germs in your genital area. ?There are many risk factors for a UTI. ?Treatment includes antibiotic medicines. ?Drink enough fluid to keep your pee pale yellow. ?This information is not intended to replace advice given to you by your health care provider. Make sure you discuss any questions you have with your health care provider. ?Document Revised: 11/21/2019 Document Reviewed: 11/21/2019 ?Elsevier Patient Education ?  2023 [employer] ?Ceftriaxone Injection ?What is this medication? ?CEFTRIAXONE (sef try AX one) treats infections caused by bacteria. It belongs to a group of medications called cephalosporin antibiotics. It will not treat colds, the flu, or infections caused by viruses. ?This medicine may  be used for other purposes; ask your health care provider or pharmacist if you have questions. ?COMMON BRAND NAME(S): Ceftrisol Plus, Rocephin ?What should I tell my care team before I take this medication? ?They need to know if you have any of these conditions: ?Bleeding disorder ?High bilirubin level in newborn patients ?Kidney disease ?Liver disease ?Poor nutrition ?An unusual or allergic reaction to ceftriaxone, other penicillin or cephalosporin antibiotics, other medicines, foods, dyes, or preservatives ?Pregnant or trying to get pregnant ?Breast-feeding ?How should I use this medication? ?This medication is injected into a vein or into a muscle. It is usually given by a health care provider in a hospital or clinic setting. It may also be given at home. ?If you get this medication at home, you will be taught how to prepare and give it. Use exactly as directed. Take it as directed on the prescription label at the same time every day. Take all of this medication unless your care team tells you to stop it early. Keep taking it even if you think you are better. ?It is important that you put your used needles and syringes in a special sharps container. Do not put them in a trash can. If you do not have a sharps container, call your care team to get one. ?Talk to your care team about the use of this medication in children. While it may be prescribed for children as young as newborns for selected conditions, precautions do apply. ?Overdosage: If you think you have taken too much of this medicine contact a poison control center or emergency room at once. ?NOTE: This medicine is only for you. Do not share this medicine with others. ?What if I miss a dose? ?If you get this medication at the hospital or clinic: It is important not to miss your dose. Call your care team if you are unable to keep an appointment. ?If you give yourself this medication at home: If you miss a dose, take it as soon as you can. Then continue your  normal schedule. If it is almost time for your next dose, take only that dose. Do not take double or extra doses. Call your care team with questions. ?What may interact with this medication? ?Birth control pills ?Intravenous calcium ?This list may not describe all possible interactions. Give your health care provider a list of all the medicines, herbs, non-prescription drugs, or dietary supplements you use. Also tell them if you smoke, drink alcohol, or use illegal drugs. Some items may interact with your medicine. ?What should I watch for while using this medication? ?Tell your care team if your symptoms do not start to get better or if they get worse. ?Do not treat diarrhea with over the counter products. Contact your care team if you have diarrhea that lasts more than 2 days or if it is severe and watery. ?If you have diabetes, you may get a false-positive result for sugar in your urine. Check with your care team. ?If you are being treated for a sexually transmitted disease (STD), avoid sexual contact until you have finished your treatment. Your sexual partner may also need treatment. ?What side effects may I notice from receiving this medication? ?Side effects that you should report to your  care team as soon as possible: ?Allergic reactions--skin rash, itching, hives, swelling of the face, lips, tongue, or throat ?Confusion ?Drowsiness ?Gallbladder problems--severe stomach pain, nausea, vomiting, fever ?Kidney injury--decrease in the amount of urine, swelling of the ankles, hands, or feet ?Kidney stones--blood in the urine, pain or trouble passing urine, pain in the lower back or sides ?Low red blood cell count--unusual weakness or fatigue, dizziness, headache, trouble breathing ?Pancreatitis--severe stomach pain that spreads to your back or gets worse after eating or when touched, fever, nausea, vomiting ?Seizures ?Severe diarrhea, fever ?Unusual weakness or fatigue ?Side effects that usually do not require  medical attention (report to your care team if they continue or are bothersome): ?Diarrhea ?This list may not describe all possible side effects. Call your doctor for medical advice about side effects.

## 2021-08-31 ENCOUNTER — Telehealth: Payer: Self-pay | Admitting: Nurse Practitioner

## 2021-08-31 ENCOUNTER — Other Ambulatory Visit: Payer: Self-pay | Admitting: Nurse Practitioner

## 2021-08-31 DIAGNOSIS — N3090 Cystitis, unspecified without hematuria: Secondary | ICD-10-CM

## 2021-08-31 MED ORDER — NITROFURANTOIN MONOHYD MACRO 100 MG PO CAPS: 100.0000 mg | ORAL_CAPSULE | Freq: Two times a day (BID) | ORAL | 0 refills | Status: AC

## 2021-08-31 NOTE — Progress Notes (Signed)
Switching to [facility]- patient has tolerated well in the past ? ?Has PCN allergy on taking second dosage of Keflex started to notice skin was getting flushed and she has nausea as well.  ? ?Advised to take a benadryl, no more antibiotics today and will start Macrobid tomorrow.  ? ?Seek follow up for worsening allergic reaction as discussed  ? ?

## 2021-08-31 NOTE — Telephone Encounter (Signed)
Discussed pending urine culture encouraged starting oral antibiotics and following up if symptoms persist or worsen  ?

## 2021-09-02 LAB — MICROSCOPIC EXAMINATION
Bacteria, UA: NONE SEEN
Casts: NONE SEEN /lpf

## 2021-09-02 LAB — UA/M W/RFLX CULTURE, ROUTINE
Bilirubin, UA: NEGATIVE
Glucose, UA: NEGATIVE
Ketones, UA: NEGATIVE
Nitrite, UA: POSITIVE — AB
Protein,UA: NEGATIVE
RBC, UA: NEGATIVE
Specific Gravity, UA: 1.015 (ref 1.005–1.030)
Urobilinogen, Ur: 0.2 mg/dL (ref 0.2–1.0)
pH, UA: 5.5 (ref 5.0–7.5)

## 2021-09-02 LAB — URINE CULTURE, REFLEX

## 2021-09-05 ENCOUNTER — Encounter: Payer: Self-pay | Admitting: Medical

## 2021-10-06 ENCOUNTER — Telehealth: Payer: BC Managed Care – PPO | Admitting: Physician Assistant

## 2021-10-06 DIAGNOSIS — H1032 Unspecified acute conjunctivitis, left eye: Secondary | ICD-10-CM | POA: Diagnosis not present

## 2021-10-07 MED ORDER — POLYMYXIN B-TRIMETHOPRIM 10000-0.1 UNIT/ML-% OP SOLN: 1.0000 [drp] | OPHTHALMIC | 0 refills | Status: DC

## 2021-10-07 NOTE — Progress Notes (Signed)
[place]

## 2021-10-07 NOTE — Progress Notes (Signed)

## 2021-10-07 NOTE — Addendum Note (Signed)
Addended by: Margaretann Loveless on: 10/07/2021 07:12 AM   Modules accepted: Orders

## 2021-11-04 ENCOUNTER — Ambulatory Visit: Payer: BC Managed Care – PPO | Admitting: Nurse Practitioner

## 2021-12-11 IMAGING — CR DG CHEST 2V
1 series · 2 of 2 positions shown · non-contrast
Comparison: None.

CLINICAL DATA: Persistent cough

EXAM:
CHEST - 2 VIEW

[Series 1: dg chest 2 view · 0.14mm/px · 2 of 2 slices shown]
[im 1/2]
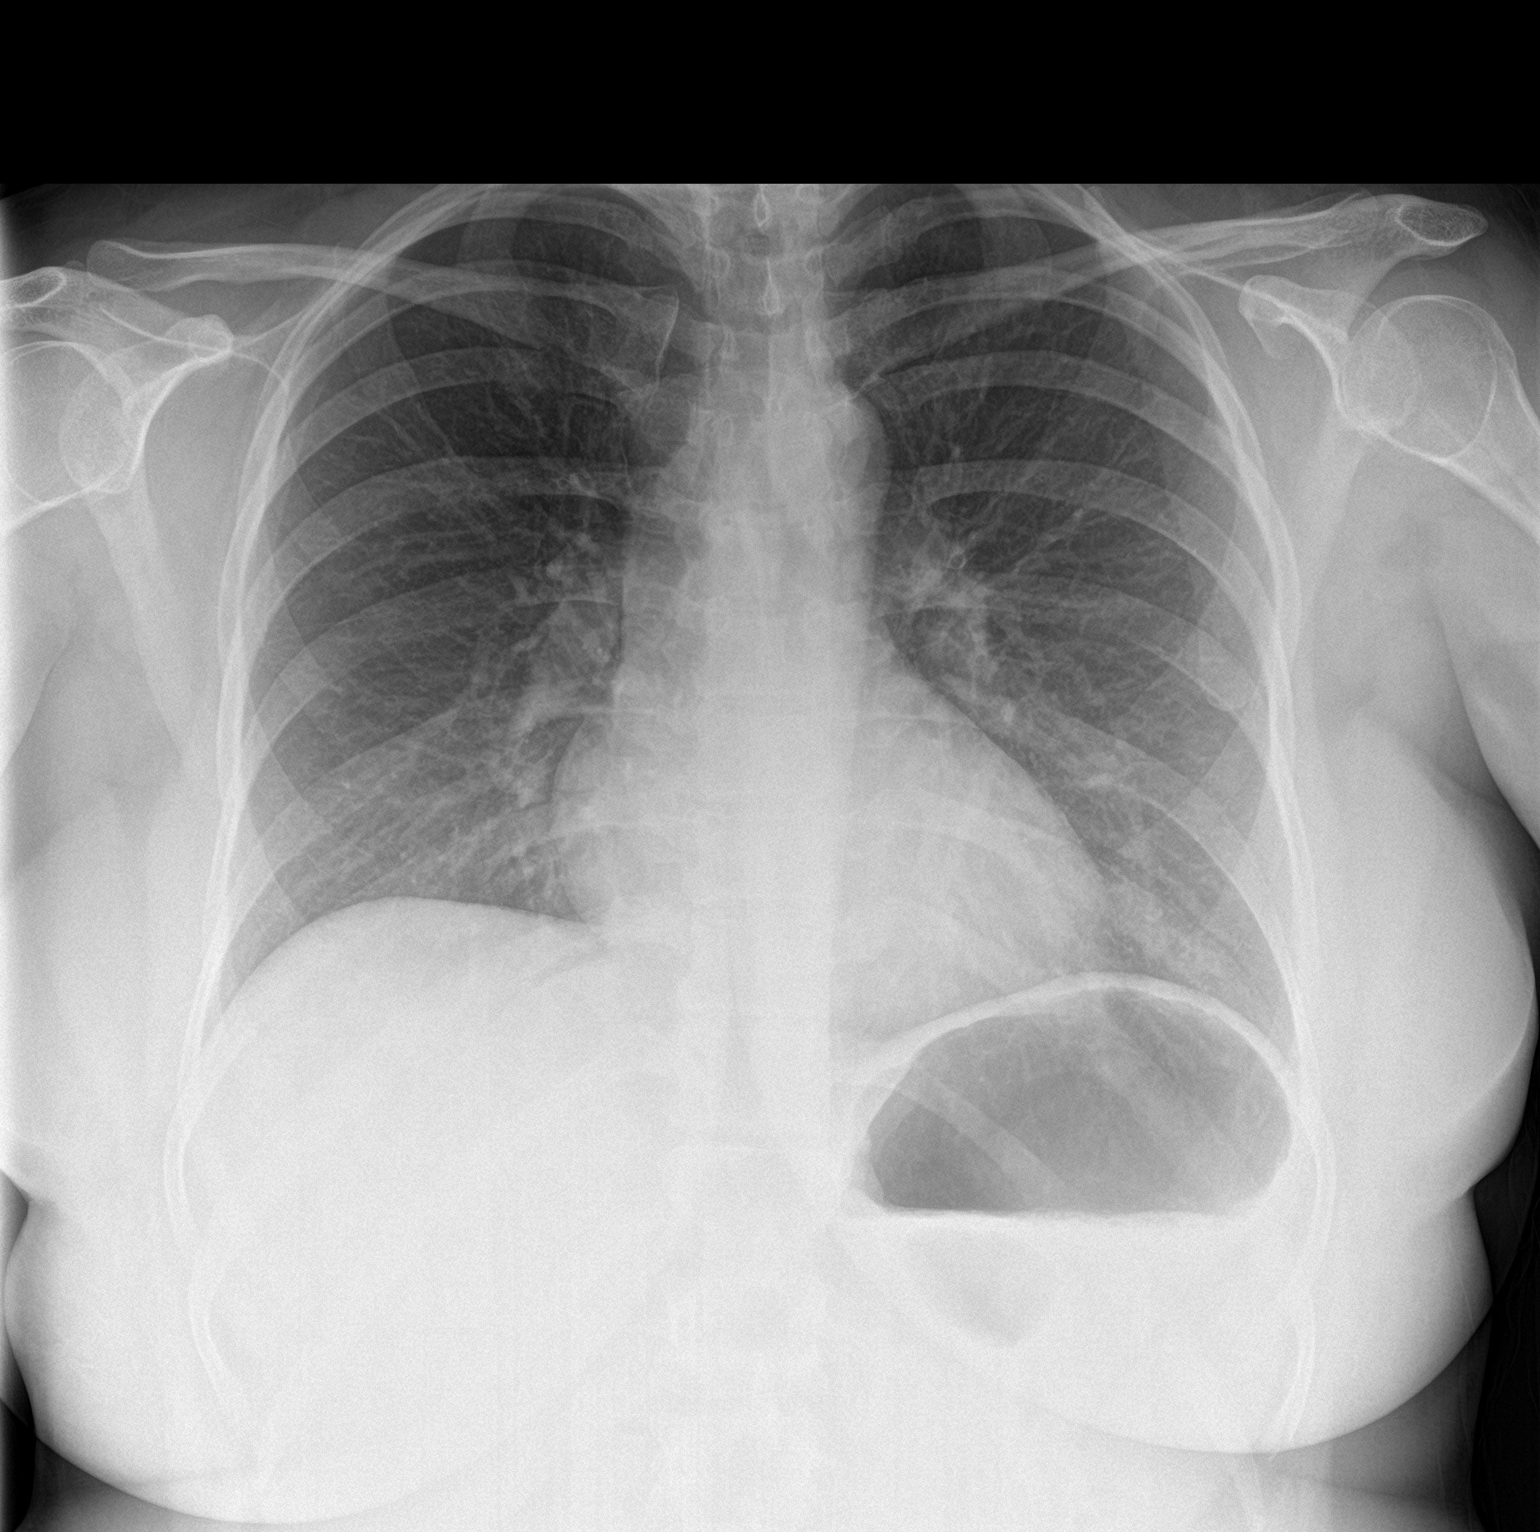
[im 2/2]
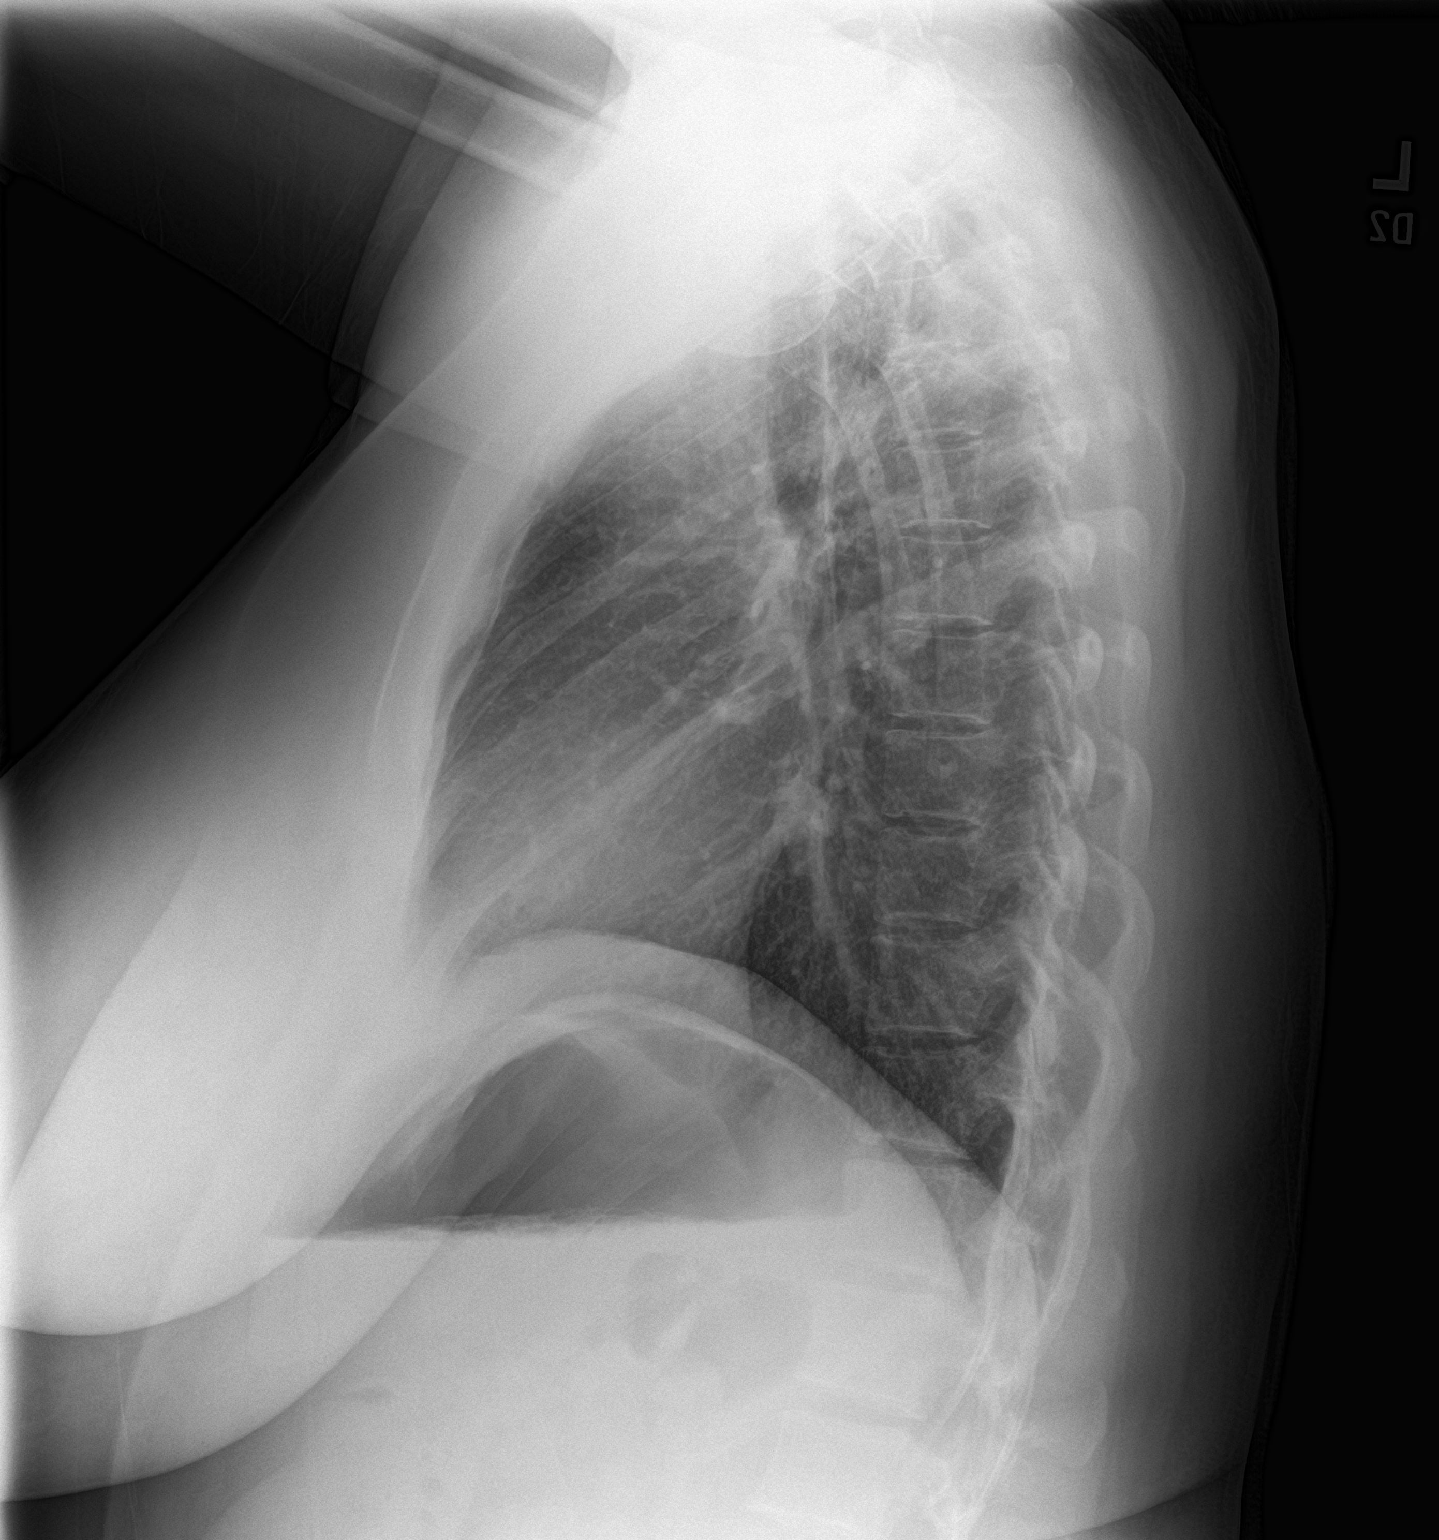

[2 of 2 positions shown; findings below may reference images not displayed]

FINDINGS: The heart size and mediastinal contours are within normal limits.
Both lungs are clear. The visualized skeletal structures are
unremarkable.
IMPRESSION: No acute abnormality of the lungs.

## 2022-03-01 ENCOUNTER — Ambulatory Visit (INDEPENDENT_AMBULATORY_CARE_PROVIDER_SITE_OTHER): Payer: Self-pay | Admitting: Nurse Practitioner

## 2022-03-01 ENCOUNTER — Encounter: Payer: Self-pay | Admitting: Nurse Practitioner

## 2022-03-01 VITALS — BP 118/78 | HR 123 | Temp 98.1°F | Ht 65.0 in | Wt 216.8 lb

## 2022-03-01 DIAGNOSIS — J069 Acute upper respiratory infection, unspecified: Secondary | ICD-10-CM

## 2022-03-01 MED ORDER — BENZONATATE 100 MG PO CAPS: 100.0000 mg | ORAL_CAPSULE | Freq: Three times a day (TID) | ORAL | 0 refills | Status: AC | PRN

## 2022-03-01 MED ORDER — FLUTICASONE PROPIONATE 50 MCG/ACT NA SUSP: 2.0000 | Freq: Every day | NASAL | 6 refills | Status: AC

## 2022-03-01 NOTE — Progress Notes (Signed)
[facility] [address], [place] [address] [phone]  Office Visit Note  Patient Name: Angela Knox Date of Birth [id number]  Medical Record number [id number]  Date of Service: 03/01/2022  Chief Complaint  Patient presents with   Ear Fullness    Ears feels full, dizzy and hard to hear in both ears, dry coughing for about a week. Doesn't feel like mucus is moving.      HPI  41 year old female presenting with dry cough for 5 days, started with sore throat no fever.  Woke up with dizziness and fullness in ears today  She has been taking Motrin at night for relief  Not currently on any decongestants    Current Medication:   Current Outpatient Medications:    amphetamine-dextroamphetamine (ADDERALL) 5 MG tablet, Take 1 tablet by mouth 2 (two) times daily., Disp: , Rfl:    zolpidem (AMBIEN) 10 MG tablet, Take 1 tablet (10 mg total) by mouth at bedtime. for sleep, Disp: 20 tablet, Rfl: 2   fluticasone (FLONASE) 50 MCG/ACT nasal spray, Place 2 sprays into both nostrils daily. (Patient not taking: Reported on 03/01/2022), Disp: 16 g, Rfl: 6   montelukast (SINGULAIR) 10 MG tablet, Take by mouth., Disp: , Rfl:    SUMAtriptan (IMITREX) 50 MG tablet, TAKE 1 TABLET BY MOUTH ONCE AS NEEDED FOR UP TO 1 DOSE FOR MIGRAINE. MAY REPEAT ONE DOSE IN 2 HOURS IF HEADACHE PERSISTS, FOR MAX DOSE 24 HOURS, Disp: 12 tablet, Rfl: 1      Medical History: Past Medical History:  Diagnosis Date   Anxiety    Depression    Insomnia    Tachycardia      Vital Signs: BP 118/78 (BP Location: Left Arm, Patient Position: Sitting, Cuff Size: Normal)   Pulse (!) 123   Temp 98.1 F (36.7 C) (Tympanic)   Ht 5\' 5"  (1.651 m)   Wt 216 lb 12.8 oz (98.3 kg)   SpO2 98%   BMI 36.08 kg/m    Review of Systems  Constitutional: Negative.   HENT:  Positive for congestion and ear pain.   Eyes: Negative.   Respiratory:  Positive for cough.   Cardiovascular: Negative.   Genitourinary:  Negative.   Musculoskeletal: Negative.   Neurological: Negative.   Hematological: Negative.   Psychiatric/Behavioral: Negative.        Physical Exam HENT:     Head: Normocephalic.     Right Ear: A middle ear effusion is present. Tympanic membrane is not injected, erythematous or bulging.     Left Ear: A middle ear effusion is present. Tympanic membrane is not injected, erythematous or bulging.     Nose: Rhinorrhea present.  Eyes:     Pupils: Pupils are equal, round, and reactive to light.  Cardiovascular:     Rate and Rhythm: Normal rate and regular rhythm.     Heart sounds: Normal heart sounds.  Pulmonary:     Effort: Pulmonary effort is normal.     Breath sounds: Normal breath sounds.  Musculoskeletal:     Cervical back: Normal range of motion.  Skin:    General: Skin is warm.  Neurological:     General: No focal deficit present.     Mental Status: She is alert and oriented to person, place, and time. Mental status is at baseline.  Psychiatric:        Mood and Affect: Mood normal.       Assessment/Plan: 1. Viral upper respiratory tract infection  Increase ibuprofen to 600mg  twice daily with food   - fluticasone (FLONASE) 50 MCG/ACT nasal spray; Place 2 sprays into both nostrils daily.  Dispense: 16 g; Refill: 6 - benzonatate (TESSALON) 100 MG capsule; Take 1 capsule (100 mg total) by mouth 3 (three) times daily as needed for cough.  Dispense: 30 capsule; Refill: 0  Return to clinic if symptoms persist or with new concerns as discussed     General Counseling: understanding of the findings of todays visit and agrees with plan of treatment. I have discussed any further diagnostic evaluation that may be needed or ordered today. We also reviewed her medications today. she has been encouraged to call the office with any questions or concerns that should arise related to todays visit.   Time spent:15 Minutes   Berneta Levins [facility] Family Nurse  Practitioner

## 2022-03-03 ENCOUNTER — Ambulatory Visit (INDEPENDENT_AMBULATORY_CARE_PROVIDER_SITE_OTHER): Payer: Self-pay | Admitting: Nurse Practitioner

## 2022-03-03 ENCOUNTER — Encounter: Payer: Self-pay | Admitting: Nurse Practitioner

## 2022-03-03 VITALS — HR 105 | Temp 98.1°F

## 2022-03-03 DIAGNOSIS — H6993 Unspecified Eustachian tube disorder, bilateral: Secondary | ICD-10-CM

## 2022-03-03 DIAGNOSIS — J302 Other seasonal allergic rhinitis: Secondary | ICD-10-CM

## 2022-03-03 MED ORDER — PREDNISONE 10 MG (21) PO TBPK: ORAL_TABLET | ORAL | 0 refills | Status: DC

## 2022-03-03 MED ORDER — AZITHROMYCIN 250 MG PO TABS: ORAL_TABLET | ORAL | 0 refills | Status: DC

## 2022-03-03 MED ORDER — PREDNISONE 10 MG (21) PO TBPK: ORAL_TABLET | ORAL | 0 refills | Status: AC

## 2022-03-03 MED ORDER — MONTELUKAST SODIUM 10 MG PO TABS: 10.0000 mg | ORAL_TABLET | Freq: Every day | ORAL | 11 refills | Status: AC

## 2022-03-03 MED ORDER — AZITHROMYCIN 250 MG PO TABS: ORAL_TABLET | ORAL | 0 refills | Status: AC

## 2022-03-03 NOTE — Progress Notes (Signed)
[facility] [address] [place], [place] [address] [phone]  Office Visit Note  Patient Name: Angela Knox Date of Birth [id number]  Medical Record number [id number]  Date of Service: 03/03/2022  Chief Complaint  Patient presents with   Ear Pain    Follow up from Wed. Ears still bothering her, now moved into her chest. Coughing more.     HPI  41 year old female presenting to [facility] with complaints of ongoing congestion, ear pressure and now cough.   Worst symptom today is pressure in ears and PND.   Cough is dry    Current Medication:  Outpatient Encounter Medications as of 03/03/2022  Medication Sig   amphetamine-dextroamphetamine (ADDERALL) 5 MG tablet Take 1 tablet by mouth 2 (two) times daily.   benzonatate (TESSALON) 100 MG capsule Take 1 capsule (100 mg total) by mouth 3 (three) times daily as needed for cough.   fluticasone (FLONASE) 50 MCG/ACT nasal spray Place 2 sprays into both nostrils daily.   SUMAtriptan (IMITREX) 50 MG tablet TAKE 1 TABLET BY MOUTH ONCE AS NEEDED FOR UP TO 1 DOSE FOR MIGRAINE. MAY REPEAT ONE DOSE IN 2 HOURS IF HEADACHE PERSISTS, FOR MAX DOSE 24 HOURS   zolpidem (AMBIEN) 10 MG tablet Take 1 tablet (10 mg total) by mouth at bedtime. for sleep   fluticasone (FLONASE) 50 MCG/ACT nasal spray Place 2 sprays into both nostrils daily. (Patient not taking: Reported on 03/01/2022)   montelukast (SINGULAIR) 10 MG tablet Take by mouth.   No facility-administered encounter medications on file as of 03/03/2022.      Medical History: Past Medical History:  Diagnosis Date   Anxiety    Depression    Insomnia    Tachycardia      Vital Signs: Pulse (!) 105   Temp 98.1 F (36.7 C) (Tympanic)   SpO2 99%    Review of Systems  Constitutional: Negative.   HENT:  Positive for ear pain.   Eyes: Negative.   Respiratory:  Positive for cough.   Cardiovascular: Negative.   Genitourinary: Negative.    Musculoskeletal: Negative.   Neurological: Negative.   Hematological: Negative.       Physical Exam HENT:     Head: Normocephalic.     Right Ear: A middle ear effusion is present. Tympanic membrane is not injected or erythematous.     Left Ear: A middle ear effusion is present. Tympanic membrane is not injected or erythematous.  Cardiovascular:     Rate and Rhythm: Normal rate and regular rhythm.     Heart sounds: Normal heart sounds.  Pulmonary:     Effort: Pulmonary effort is normal.     Breath sounds: Normal breath sounds.  Musculoskeletal:     Cervical back: Normal range of motion.  Skin:    General: Skin is warm.     Capillary Refill: Capillary refill takes less than 2 seconds.  Neurological:     General: No focal deficit present.     Mental Status: She is alert.  Psychiatric:        Mood and Affect: Mood normal.       Assessment/Plan: 1. Seasonal allergic rhinitis, unspecified trigger  - montelukast (SINGULAIR) 10 MG tablet; Take 1 tablet (10 mg total) by mouth at bedtime.  Dispense: 30 tablet; Refill: 11  2. Dysfunction of both eustachian tubes  - predniSONE (STERAPRED UNI-PAK 21 TAB) 10 MG (21) TBPK tablet; Take 6 tablets on day one, 5 on day  two, 4 on day three, 3 on day four, 2 on day five, and 1 on day six. Take with food.  Dispense: 21 tablet; Refill: 0 - azithromycin (ZITHROMAX) 250 MG tablet; Take 2 tablets on day 1, then 1 tablet daily on days 2 through 5  Dispense: 6 tablet; Refill: 0    General Counseling: Angela Knox verbalizes understanding of the findings of todays visit and agrees with plan of treatment. I have discussed any further diagnostic evaluation that may be needed or ordered today. We also reviewed her medications today. she has been encouraged to call the office with any questions or concerns that should arise related to todays visit.    Time spent:20 Minutes   Viviano Simas [facility] Family Nurse Practitioner

## 2022-03-27 ENCOUNTER — Ambulatory Visit (INDEPENDENT_AMBULATORY_CARE_PROVIDER_SITE_OTHER): Payer: Self-pay | Admitting: Physician Assistant

## 2022-03-27 ENCOUNTER — Encounter: Payer: Self-pay | Admitting: Physician Assistant

## 2022-03-27 VITALS — BP 116/70 | HR 70 | Temp 97.8°F | Wt 220.0 lb

## 2022-03-27 DIAGNOSIS — J069 Acute upper respiratory infection, unspecified: Secondary | ICD-10-CM

## 2022-03-27 NOTE — Progress Notes (Signed)
[facility] [address] [place], [place] [address]   Office Visit Note  Patient Name: Angela Knox Date of Birth [id number]  Medical Record number [id number]  Date of Service: 03/27/2022  Chief Complaint  Patient presents with   Ear Pain    Ear and Jaw pain, feels run down. Started Fri and Sat. Dry cough, no mucus coming out. No ST, fever     41 y/o F presents to the clinic for c/o b/l ear and cheek pain x 3 days. Denies fever, nasal congestion, rhinorrhea, cough, sore throat, CP, SOB, wheezing. Has intermittently taken Motrin with some relief. Denies recent dental procedures. No known exposure to strep, covid, or flu. No h/o TMJ      Current Medication:  Outpatient Encounter Medications as of 03/27/2022  Medication Sig   amphetamine-dextroamphetamine (ADDERALL) 5 MG tablet Take 1 tablet by mouth 2 (two) times daily.   zolpidem (AMBIEN) 10 MG tablet Take 1 tablet (10 mg total) by mouth at bedtime. for sleep   benzonatate (TESSALON) 100 MG capsule Take 1 capsule (100 mg total) by mouth 3 (three) times daily as needed for cough. (Patient not taking: Reported on 03/27/2022)   fluticasone (FLONASE) 50 MCG/ACT nasal spray Place 2 sprays into both nostrils daily.   montelukast (SINGULAIR) 10 MG tablet Take 1 tablet (10 mg total) by mouth at bedtime.   predniSONE (STERAPRED UNI-PAK 21 TAB) 10 MG (21) TBPK tablet Take 6 tablets on day one, 5 on day two, 4 on day three, 3 on day four, 2 on day five, and 1 on day six. Take with food. (Patient not taking: Reported on 03/27/2022)   SUMAtriptan (IMITREX) 50 MG tablet TAKE 1 TABLET BY MOUTH ONCE AS NEEDED FOR UP TO 1 DOSE FOR MIGRAINE. MAY REPEAT ONE DOSE IN 2 HOURS IF HEADACHE PERSISTS, FOR MAX DOSE 24 HOURS   No facility-administered encounter medications on file as of 03/27/2022.      Medical History: Past Medical History:  Diagnosis Date   Anxiety    Depression    Insomnia    Tachycardia      Vital Signs: BP 116/70  (BP Location: Left Arm, Patient Position: Sitting, Cuff Size: Normal)   Pulse 70   Temp 97.8 F (36.6 C) (Tympanic)   Wt 220 lb (99.8 kg)   SpO2 97%   BMI 36.61 kg/m    Review of Systems  Constitutional: Negative.   HENT:  Positive for sinus pressure. Negative for postnasal drip, sinus pain and sore throat.   Respiratory: Negative.    Cardiovascular: Negative.   Neurological: Negative.     Physical Exam Constitutional:      Appearance: Normal appearance.  HENT:     Head: Atraumatic.     Right Ear: Tympanic membrane, ear canal and external ear normal.     Left Ear: Tympanic membrane, ear canal and external ear normal.     Nose: Nose normal.     Mouth/Throat:     Mouth: Mucous membranes are moist.     Pharynx: Oropharynx is clear.  Eyes:     Extraocular Movements: Extraocular movements intact.  Cardiovascular:     Rate and Rhythm: Normal rate and regular rhythm.  Pulmonary:     Effort: Pulmonary effort is normal.     Breath sounds: Normal breath sounds.  Musculoskeletal:     Cervical back: Neck supple.  Skin:    General: Skin is warm.  Neurological:     Mental Status: She  is alert.  Psychiatric:        Mood and Affect: Mood normal.        Behavior: Behavior normal.        Thought Content: Thought content normal.        Judgment: Judgment normal.       Assessment/Plan:  1. Viral upper respiratory tract infection  Increase fluids Consider a trial of oral decongestant ie Sudafed  Continue with Ibuprofen as needed for pain. Contact us if symptoms don't improve or worsen. Pt verbalized understanding and in agreement.   General Counseling: khamia stambaugh understanding of the findings of todays visit and agrees with plan of treatment. I have discussed any further diagnostic evaluation that may be needed or ordered today. We also reviewed her medications today. she has been encouraged to call the office with any questions or concerns that should arise related  to todays visit.    Time spent:20 Minutes    Gilberto Better, [place] Physician Assistant

## 2022-05-08 ENCOUNTER — Encounter: Payer: Self-pay | Admitting: Physician Assistant

## 2022-05-08 ENCOUNTER — Ambulatory Visit (INDEPENDENT_AMBULATORY_CARE_PROVIDER_SITE_OTHER): Payer: Self-pay | Admitting: Physician Assistant

## 2022-05-08 VITALS — BP 120/80 | HR 87 | Temp 98.0°F

## 2022-05-08 DIAGNOSIS — Z20822 Contact with and (suspected) exposure to covid-19: Secondary | ICD-10-CM

## 2022-05-08 DIAGNOSIS — U071 COVID-19: Secondary | ICD-10-CM

## 2022-05-08 DIAGNOSIS — R0981 Nasal congestion: Secondary | ICD-10-CM

## 2022-05-08 LAB — POC COVID19 BINAXNOW: SARS Coronavirus 2 Ag: POSITIVE — AB

## 2022-05-08 NOTE — Progress Notes (Signed)
[facility] [address], [place] [address]   Office Visit Note  Patient Name: Angela Knox Date of Birth [id number]  Medical Record number [id number]  Date of Service: 05/08/2022  Chief Complaint  Patient presents with   Covid Exposure    Traveled to [place] and got back Sat. Started with symptoms on Sat. People that were on the trip have tested Pos. For COVID. Congested, ears, feels run down, taste ans smell is off.      42 y/o F presents to the clinic for c/o head congestion, cough, and feeling "run down". She was exposed to covid when she traveled abroad with a group of co-workers. Her symptoms started 2 days ago. She tested positive at home today.       Current Medication:  Outpatient Encounter Medications as of 05/08/2022  Medication Sig   amphetamine-dextroamphetamine (ADDERALL) 5 MG tablet Take 1 tablet by mouth 2 (two) times daily.   zolpidem (AMBIEN) 10 MG tablet Take 1 tablet (10 mg total) by mouth at bedtime. for sleep   benzonatate (TESSALON) 100 MG capsule Take 1 capsule (100 mg total) by mouth 3 (three) times daily as needed for cough. (Patient not taking: Reported on 03/27/2022)   fluticasone (FLONASE) 50 MCG/ACT nasal spray Place 2 sprays into both nostrils daily. (Patient not taking: Reported on 05/08/2022)   montelukast (SINGULAIR) 10 MG tablet Take 1 tablet (10 mg total) by mouth at bedtime. (Patient not taking: Reported on 05/08/2022)   predniSONE (STERAPRED UNI-PAK 21 TAB) 10 MG (21) TBPK tablet Take 6 tablets on day one, 5 on day two, 4 on day three, 3 on day four, 2 on day five, and 1 on day six. Take with food. (Patient not taking: Reported on 03/27/2022)   SUMAtriptan (IMITREX) 50 MG tablet TAKE 1 TABLET BY MOUTH ONCE AS NEEDED FOR UP TO 1 DOSE FOR MIGRAINE. MAY REPEAT ONE DOSE IN 2 HOURS IF HEADACHE PERSISTS, FOR MAX DOSE 24 HOURS   No facility-administered encounter medications on file as of 05/08/2022.      Medical History: Past  Medical History:  Diagnosis Date   Anxiety    Depression    Insomnia    Tachycardia      Vital Signs: BP 120/80 (BP Location: Left Arm, Patient Position: Sitting, Cuff Size: Normal)   Pulse 87   Temp 98 F (36.7 C) (Tympanic)   SpO2 99%    Review of Systems  Constitutional: Negative.   HENT:  Positive for congestion and postnasal drip. Negative for sinus pressure, sinus pain, sore throat and trouble swallowing.   Respiratory:  Positive for cough. Negative for chest tightness, shortness of breath and wheezing.   Cardiovascular: Negative.   Neurological: Negative.     Physical Exam Constitutional:      Appearance: Normal appearance.  HENT:     Head: Atraumatic.     Right Ear: Tympanic membrane, ear canal and external ear normal.     Left Ear: Tympanic membrane, ear canal and external ear normal.     Nose: Nose normal.     Mouth/Throat:     Mouth: Mucous membranes are moist.     Pharynx: Oropharynx is clear.  Eyes:     Extraocular Movements: Extraocular movements intact.  Cardiovascular:     Rate and Rhythm: Normal rate and regular rhythm.  Pulmonary:     Effort: Pulmonary effort is normal.     Breath sounds: Normal breath sounds.  Musculoskeletal:  Cervical back: Neck supple.  Skin:    General: Skin is warm.  Neurological:     Mental Status: She is alert.  Psychiatric:        Mood and Affect: Mood normal.        Behavior: Behavior normal.        Thought Content: Thought content normal.        Judgment: Judgment normal.       Assessment/Plan:  1. COVID-19  2. Exposure to COVID-19 virus - POC COVID-19 BinaxNow  3. Head congestion  Reviewed positive covid test result with patient.  REST Increase fluids Alternate Tylenol and Ibuprofen as needed. May take otc cough medicines as directed on the bottle.  Reviewed CDC guidelines with patient. Pt will be isolated for 5 days from the start of her symptoms. Needs to be fever free for 24 hours without the  aid of medicine before returning back to work. She must wear a mask for an additional 5 days.  Pt verbalized understanding and in agreement.   General Counseling: michalene debruler understanding of the findings of todays visit and agrees with plan of treatment. I have discussed any further diagnostic evaluation that may be needed or ordered today. We also reviewed her medications today. she has been encouraged to call the office with any questions or concerns that should arise related to todays visit.    Time spent:20 [place], [place] Physician Assistant

## 2022-05-09 ENCOUNTER — Ambulatory Visit: Payer: Self-pay | Admitting: Physician Assistant

## 2022-05-11 ENCOUNTER — Encounter: Payer: Self-pay | Admitting: Physician Assistant

## 2022-05-11 DIAGNOSIS — H1031 Unspecified acute conjunctivitis, right eye: Secondary | ICD-10-CM

## 2022-05-11 MED ORDER — POLYMYXIN B-TRIMETHOPRIM 10000-0.1 UNIT/ML-% OP SOLN: 1.0000 [drp] | Freq: Four times a day (QID) | OPHTHALMIC | 0 refills | Status: AC

## 2022-05-11 NOTE — Telephone Encounter (Signed)
Pt with yeallowish/cream colored discharge from the right eye. NO known exposure to pink eye. +contact use, but dispose of her contacts. Rx for Polytrim ophthalmic eye drops sent to preferred pharmacy

## 2022-06-29 ENCOUNTER — Other Ambulatory Visit: Payer: Self-pay | Admitting: Family Medicine

## 2022-06-29 DIAGNOSIS — Z1231 Encounter for screening mammogram for malignant neoplasm of breast: Secondary | ICD-10-CM

## 2022-09-26 ENCOUNTER — Ambulatory Visit: Payer: Self-pay | Admitting: Physician Assistant

## 2022-11-20 ENCOUNTER — Ambulatory Visit
Admission: RE | Admit: 2022-11-20 | Discharge: 2022-11-20 | Disposition: A | Discharge status: Home / Self Care | Payer: BC Managed Care – PPO | Source: Ambulatory Visit | Attending: Family Medicine | Admitting: Family Medicine

## 2022-11-20 DIAGNOSIS — Z1231 Encounter for screening mammogram for malignant neoplasm of breast: Secondary | ICD-10-CM | POA: Insufficient documentation

## 2023-01-04 ENCOUNTER — Other Ambulatory Visit: Payer: Self-pay | Admitting: Family Medicine

## 2023-01-04 DIAGNOSIS — N6320 Unspecified lump in the left breast, unspecified quadrant: Secondary | ICD-10-CM

## 2023-01-04 DIAGNOSIS — N6310 Unspecified lump in the right breast, unspecified quadrant: Secondary | ICD-10-CM

## 2023-01-12 ENCOUNTER — Ambulatory Visit
Admission: RE | Admit: 2023-01-12 | Discharge: 2023-01-12 | Disposition: A | Discharge status: Home / Self Care | Payer: BC Managed Care – PPO | Source: Ambulatory Visit | Attending: Family Medicine | Admitting: Family Medicine

## 2023-01-12 DIAGNOSIS — N6310 Unspecified lump in the right breast, unspecified quadrant: Secondary | ICD-10-CM

## 2023-01-12 DIAGNOSIS — N6320 Unspecified lump in the left breast, unspecified quadrant: Secondary | ICD-10-CM

## 2023-01-24 ENCOUNTER — Ambulatory Visit
Admission: RE | Admit: 2023-01-24 | Discharge: 2023-01-24 | Disposition: A | Discharge status: Home / Self Care | Payer: BC Managed Care – PPO | Source: Ambulatory Visit | Attending: Family Medicine | Admitting: Family Medicine

## 2023-01-24 ENCOUNTER — Other Ambulatory Visit: Payer: Self-pay | Admitting: Family Medicine

## 2023-01-24 DIAGNOSIS — K219 Gastro-esophageal reflux disease without esophagitis: Secondary | ICD-10-CM | POA: Insufficient documentation

## 2023-01-24 DIAGNOSIS — R1011 Right upper quadrant pain: Secondary | ICD-10-CM

## 2023-01-29 ENCOUNTER — Other Ambulatory Visit: Payer: Self-pay | Admitting: Family Medicine

## 2023-01-29 DIAGNOSIS — R1011 Right upper quadrant pain: Secondary | ICD-10-CM

## 2023-01-29 DIAGNOSIS — R112 Nausea with vomiting, unspecified: Secondary | ICD-10-CM

## 2023-02-05 ENCOUNTER — Ambulatory Visit: Payer: BC Managed Care – PPO

## 2023-03-09 ENCOUNTER — Other Ambulatory Visit: Payer: Self-pay

## 2023-03-09 DIAGNOSIS — Z021 Encounter for pre-employment examination: Secondary | ICD-10-CM

## 2023-03-09 NOTE — Progress Notes (Signed)
Presents to [employer] for pre-employment drug screen for a position in [employer].  [facility] Acct #:  [id number] [facility] Specimen #:  [id number]  Rapid drug screen results = Negative  AMD

## 2023-04-02 IMAGING — US US ABDOMEN LIMITED
1 series · 15 of 25 positions shown · non-contrast
Comparison: CT evaluation from 0757.

CLINICAL DATA: RIGHT upper quadrant pain with nausea vomiting and
diarrhea for few days.

EXAM:
ULTRASOUND ABDOMEN LIMITED RIGHT UPPER QUADRANT

[Series 1: us abdomen limited ruq · 15 of 39 slices shown]
[im 1/39]
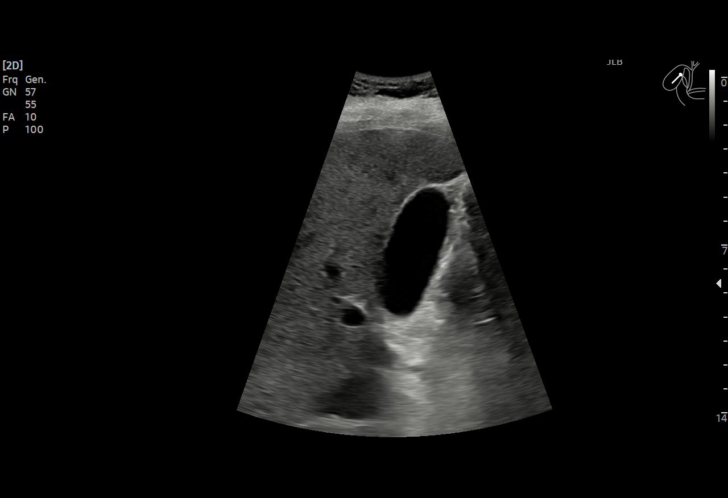
[im 4/39]
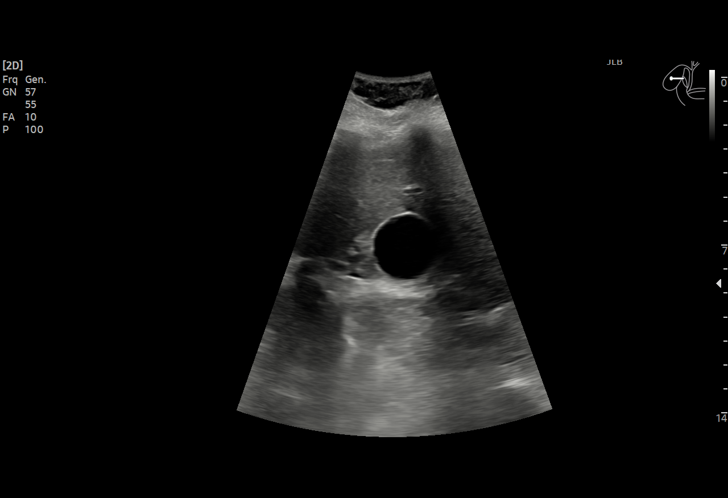
[im 7/39]
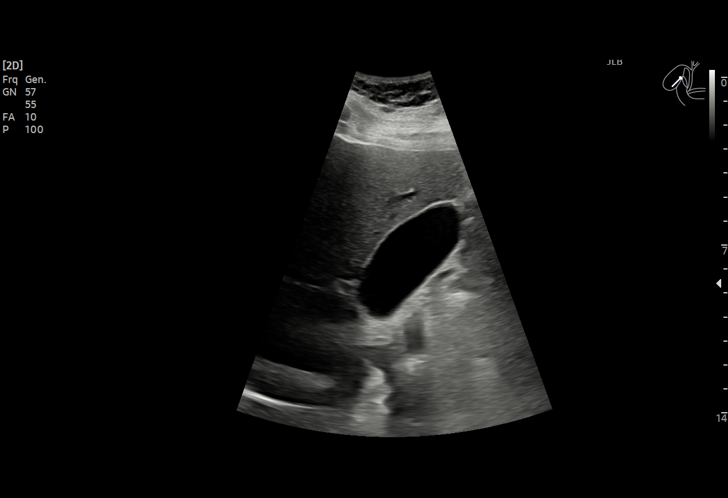
[im 8/39]
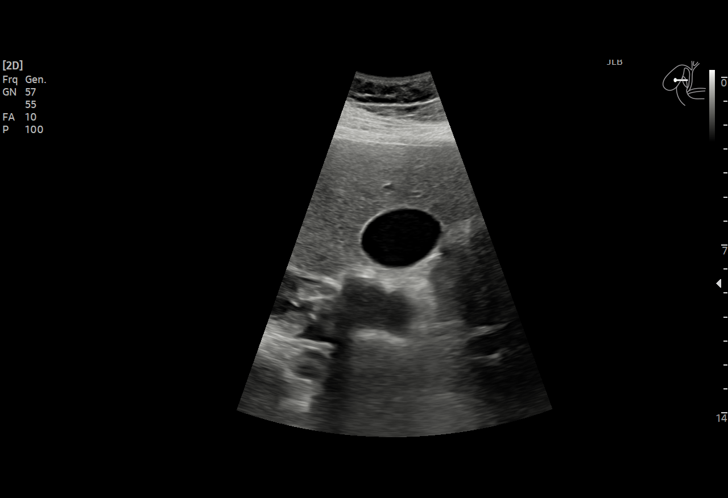
[im 12/39]
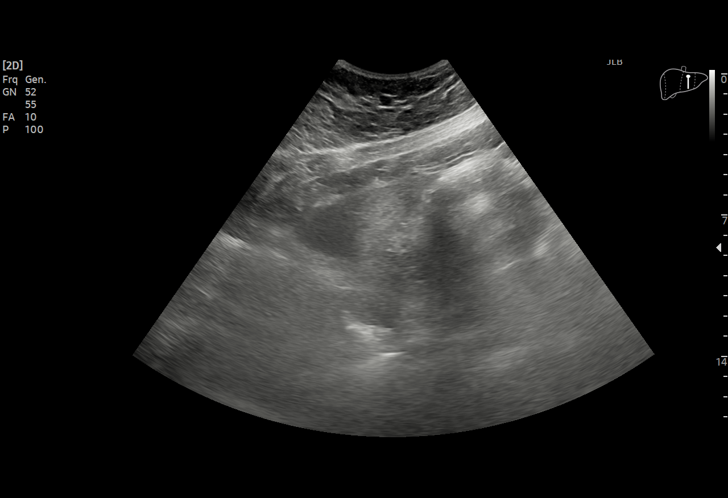
[im 15/39]
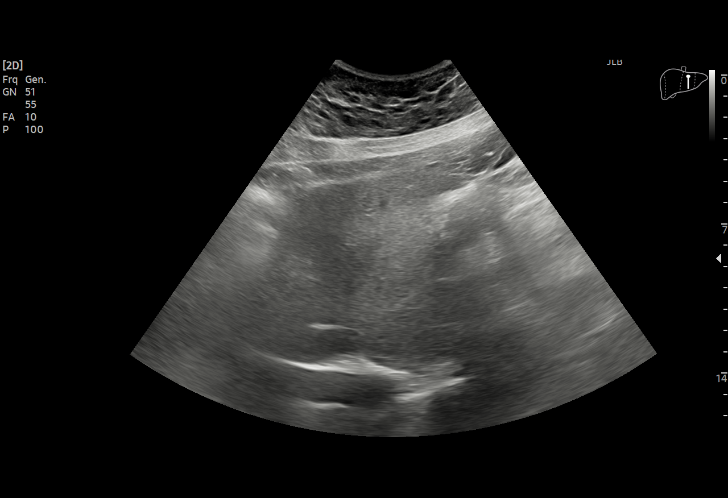
[im 16/39]
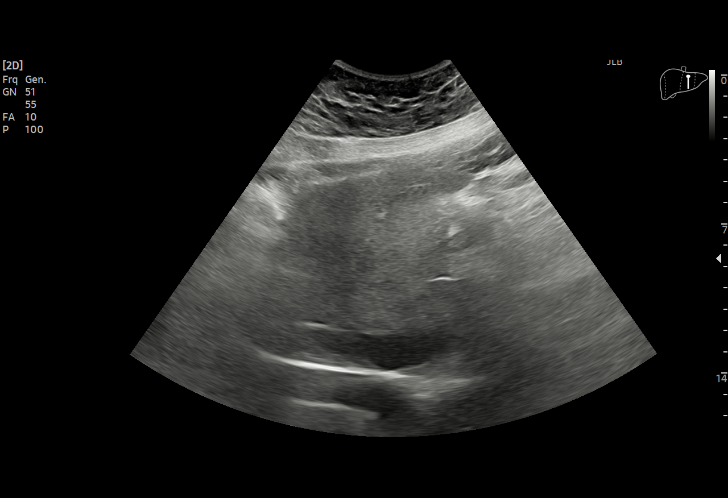
[im 20/39]
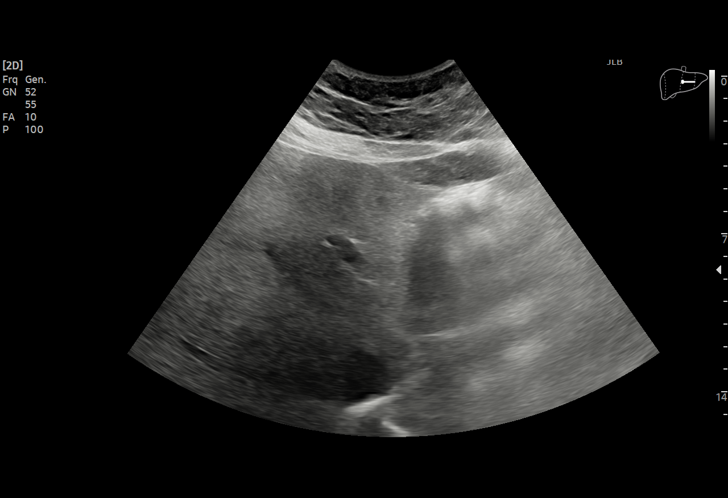
[im 23/39]
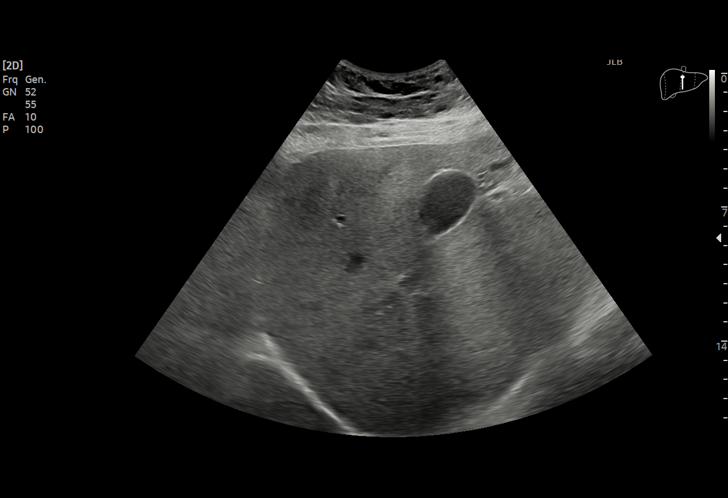
[im 24/39]
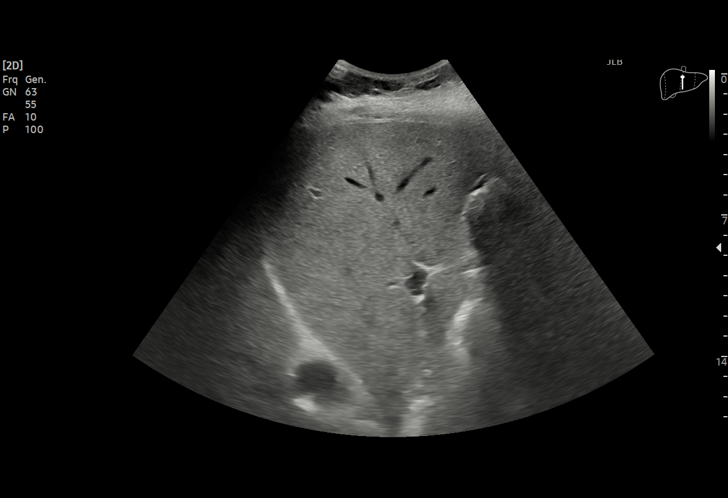
[im 27/39]
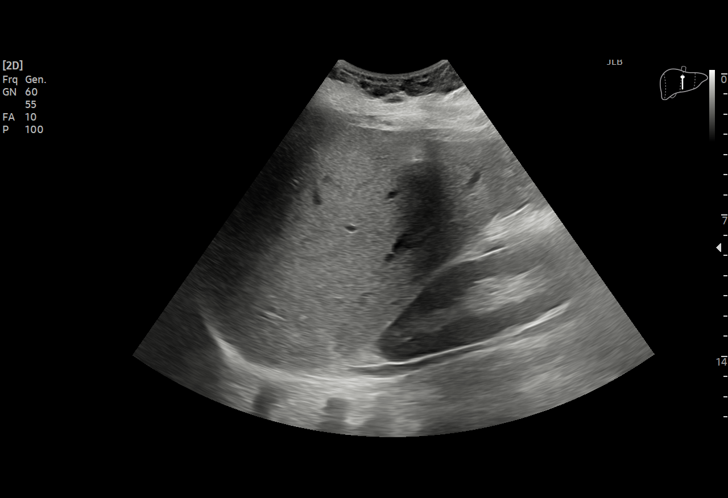
[im 31/39]
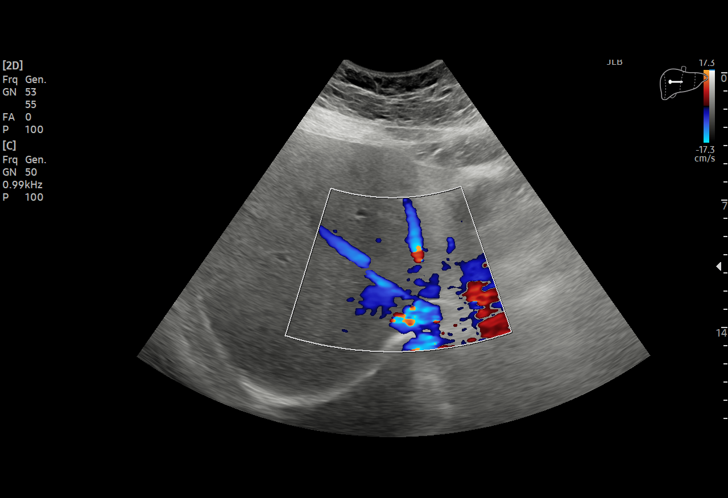
[im 32/39]
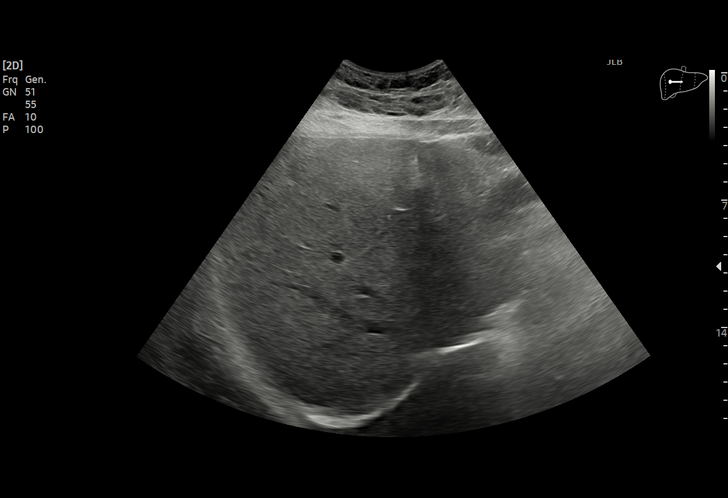
[im 35/39]
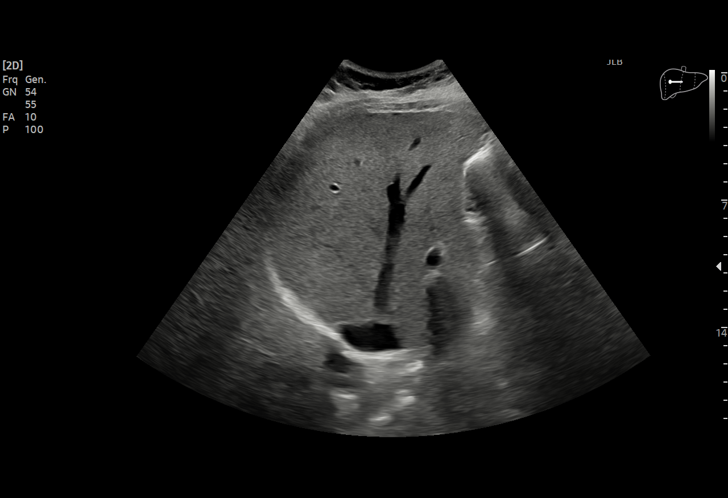
[im 39/39]
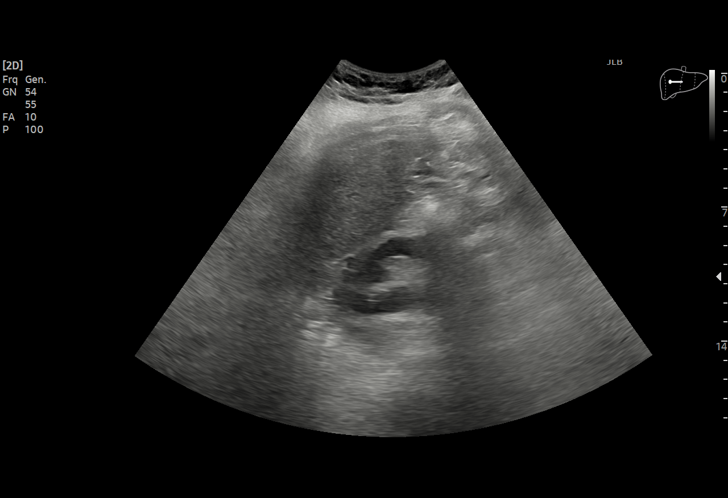

[15 of 25 positions shown; findings below may reference images not displayed]

FINDINGS: Gallbladder:

No gallstones or wall thickening visualized. No sonographic Murphy
sign noted by sonographer.

Common bile duct:

Diameter: 3.2 mm

Liver:

Mild to moderate hepatomegaly with increased hepatic echogenicity
liver measuring approximately 19 cm greatest craniocaudal dimension.
Portal vein is patent on color Doppler imaging with normal direction
of blood flow towards the liver.

Other: No ascites visualized.
IMPRESSION: Hepatic steatosis. No acute findings by RIGHT upper quadrant
ultrasound.

## 2023-06-27 ENCOUNTER — Other Ambulatory Visit: Payer: Self-pay | Admitting: Family Medicine

## 2023-06-27 DIAGNOSIS — G43709 Chronic migraine without aura, not intractable, without status migrainosus: Secondary | ICD-10-CM | POA: Diagnosis not present

## 2023-06-27 DIAGNOSIS — Z124 Encounter for screening for malignant neoplasm of cervix: Secondary | ICD-10-CM | POA: Diagnosis not present

## 2023-06-27 DIAGNOSIS — Z1231 Encounter for screening mammogram for malignant neoplasm of breast: Secondary | ICD-10-CM | POA: Diagnosis not present

## 2023-06-27 DIAGNOSIS — G4700 Insomnia, unspecified: Secondary | ICD-10-CM | POA: Diagnosis not present

## 2023-06-27 DIAGNOSIS — F32A Depression, unspecified: Secondary | ICD-10-CM | POA: Diagnosis not present

## 2023-06-27 DIAGNOSIS — J4520 Mild intermittent asthma, uncomplicated: Secondary | ICD-10-CM | POA: Diagnosis not present

## 2023-06-27 DIAGNOSIS — J309 Allergic rhinitis, unspecified: Secondary | ICD-10-CM | POA: Diagnosis not present

## 2023-06-27 DIAGNOSIS — E559 Vitamin D deficiency, unspecified: Secondary | ICD-10-CM | POA: Diagnosis not present

## 2023-06-27 DIAGNOSIS — Z1151 Encounter for screening for human papillomavirus (HPV): Secondary | ICD-10-CM | POA: Diagnosis not present

## 2023-07-17 DIAGNOSIS — M5442 Lumbago with sciatica, left side: Secondary | ICD-10-CM | POA: Diagnosis not present

## 2023-08-09 DIAGNOSIS — R0981 Nasal congestion: Secondary | ICD-10-CM | POA: Diagnosis not present

## 2023-08-09 DIAGNOSIS — J0140 Acute pansinusitis, unspecified: Secondary | ICD-10-CM | POA: Diagnosis not present

## 2023-08-14 DIAGNOSIS — M25552 Pain in left hip: Secondary | ICD-10-CM | POA: Diagnosis not present

## 2023-08-22 DIAGNOSIS — M2242 Chondromalacia patellae, left knee: Secondary | ICD-10-CM | POA: Diagnosis not present

## 2023-09-18 DIAGNOSIS — N939 Abnormal uterine and vaginal bleeding, unspecified: Secondary | ICD-10-CM | POA: Diagnosis not present

## 2023-09-18 DIAGNOSIS — R5383 Other fatigue: Secondary | ICD-10-CM | POA: Diagnosis not present

## 2023-09-18 DIAGNOSIS — N951 Menopausal and female climacteric states: Secondary | ICD-10-CM | POA: Diagnosis not present

## 2023-09-19 ENCOUNTER — Encounter

## 2023-09-20 DIAGNOSIS — H10022 Other mucopurulent conjunctivitis, left eye: Secondary | ICD-10-CM | POA: Diagnosis not present

## 2023-11-01 DIAGNOSIS — N939 Abnormal uterine and vaginal bleeding, unspecified: Secondary | ICD-10-CM | POA: Diagnosis not present

## 2023-11-01 DIAGNOSIS — N951 Menopausal and female climacteric states: Secondary | ICD-10-CM | POA: Diagnosis not present

## 2023-11-19 DIAGNOSIS — N951 Menopausal and female climacteric states: Secondary | ICD-10-CM | POA: Diagnosis not present

## 2023-11-19 DIAGNOSIS — R102 Pelvic and perineal pain: Secondary | ICD-10-CM | POA: Diagnosis not present

## 2023-11-19 DIAGNOSIS — N939 Abnormal uterine and vaginal bleeding, unspecified: Secondary | ICD-10-CM | POA: Diagnosis not present

## 2024-01-16 ENCOUNTER — Ambulatory Visit
Admission: RE | Admit: 2024-01-16 | Discharge: 2024-01-16 | Disposition: A | Discharge status: Home / Self Care | Source: Ambulatory Visit | Attending: Family Medicine | Admitting: Family Medicine

## 2024-01-16 DIAGNOSIS — Z1231 Encounter for screening mammogram for malignant neoplasm of breast: Secondary | ICD-10-CM | POA: Insufficient documentation

## 2024-02-14 DIAGNOSIS — J309 Allergic rhinitis, unspecified: Secondary | ICD-10-CM | POA: Diagnosis not present

## 2024-02-14 DIAGNOSIS — R499 Unspecified voice and resonance disorder: Secondary | ICD-10-CM | POA: Diagnosis not present

## 2024-02-14 DIAGNOSIS — R058 Other specified cough: Secondary | ICD-10-CM | POA: Diagnosis not present

## 2024-02-14 DIAGNOSIS — J40 Bronchitis, not specified as acute or chronic: Secondary | ICD-10-CM | POA: Diagnosis not present

## 2024-02-14 DIAGNOSIS — J4520 Mild intermittent asthma, uncomplicated: Secondary | ICD-10-CM | POA: Diagnosis not present

## 2024-02-14 DIAGNOSIS — Z6829 Body mass index (BMI) 29.0-29.9, adult: Secondary | ICD-10-CM | POA: Diagnosis not present

## 2024-03-17 DIAGNOSIS — Z6829 Body mass index (BMI) 29.0-29.9, adult: Secondary | ICD-10-CM | POA: Diagnosis not present

## 2024-03-17 DIAGNOSIS — K219 Gastro-esophageal reflux disease without esophagitis: Secondary | ICD-10-CM | POA: Diagnosis not present

## 2024-03-17 DIAGNOSIS — R109 Unspecified abdominal pain: Secondary | ICD-10-CM | POA: Diagnosis not present

## 2024-03-17 DIAGNOSIS — R1011 Right upper quadrant pain: Secondary | ICD-10-CM | POA: Diagnosis not present

## 2024-03-26 DIAGNOSIS — L905 Scar conditions and fibrosis of skin: Secondary | ICD-10-CM | POA: Diagnosis not present

## 2024-03-26 DIAGNOSIS — L308 Other specified dermatitis: Secondary | ICD-10-CM | POA: Diagnosis not present

## 2024-03-26 DIAGNOSIS — D1801 Hemangioma of skin and subcutaneous tissue: Secondary | ICD-10-CM | POA: Diagnosis not present

## 2024-03-26 DIAGNOSIS — L821 Other seborrheic keratosis: Secondary | ICD-10-CM | POA: Diagnosis not present

## 2024-03-26 DIAGNOSIS — L728 Other follicular cysts of the skin and subcutaneous tissue: Secondary | ICD-10-CM | POA: Diagnosis not present

## 2024-04-21 NOTE — Progress Notes (Deleted)
 S/Sx started 04/12/2024 Wheezing - feels something in her chest Productive Cough - unsure of color of phlegm b/c she doesn't look at it States early on she had fever & it was as high at 6 F  States everyone in her house has had the flu. Her daughter tested positive for the flu, but she didn't test herself. States their symptoms started on the same day.  Has not used any OTC medications & no longer takes Sinuglair.  Called back at 3:09 pm & requested to cancel the appointment. States I'm just going to go to a walk-in clinic later this evening. I'm just paranoid about my lungs. Offered to change her appointment to day at 3:45 pm & Delon declined the appointment. States I don't have anyone to watch her.  I'll just go to a walk-in clinic later.

## 2024-04-22 ENCOUNTER — Ambulatory Visit: Payer: Self-pay | Admitting: Physician Assistant
